# Patient Record
Sex: Female | Born: 1960 | Race: Asian | Hispanic: No | Marital: Married | Smoking: Never smoker
Health system: Southern US, Community
[De-identification: ages and names within clinical notes are randomized; demographics above are authoritative.]

## PROBLEM LIST (undated history)

## (undated) DIAGNOSIS — R609 Edema, unspecified: Secondary | ICD-10-CM

## (undated) DIAGNOSIS — R809 Proteinuria, unspecified: Secondary | ICD-10-CM

## (undated) DIAGNOSIS — C801 Malignant (primary) neoplasm, unspecified: Secondary | ICD-10-CM

## (undated) DIAGNOSIS — E079 Disorder of thyroid, unspecified: Secondary | ICD-10-CM

## (undated) HISTORY — PX: TUBAL LIGATION: SHX77

## (undated) HISTORY — DX: Edema, unspecified: R60.9

## (undated) HISTORY — DX: Disorder of thyroid, unspecified: E07.9

---

## 1999-02-23 ENCOUNTER — Other Ambulatory Visit: Admission: RE | Admit: 1999-02-23 | Discharge: 1999-02-23 | Payer: Self-pay | Admitting: Obstetrics and Gynecology

## 2000-03-27 ENCOUNTER — Other Ambulatory Visit: Admission: RE | Admit: 2000-03-27 | Discharge: 2000-03-27 | Payer: Self-pay | Admitting: Obstetrics and Gynecology

## 2001-05-07 ENCOUNTER — Other Ambulatory Visit: Admission: RE | Admit: 2001-05-07 | Discharge: 2001-05-07 | Payer: Self-pay | Admitting: Obstetrics and Gynecology

## 2002-06-14 ENCOUNTER — Other Ambulatory Visit: Admission: RE | Admit: 2002-06-14 | Discharge: 2002-06-14 | Payer: Self-pay | Admitting: Obstetrics and Gynecology

## 2003-07-15 ENCOUNTER — Other Ambulatory Visit: Admission: RE | Admit: 2003-07-15 | Discharge: 2003-07-15 | Payer: Self-pay | Admitting: Obstetrics and Gynecology

## 2004-08-03 ENCOUNTER — Other Ambulatory Visit: Admission: RE | Admit: 2004-08-03 | Discharge: 2004-08-03 | Payer: Self-pay | Admitting: Obstetrics and Gynecology

## 2005-08-28 ENCOUNTER — Other Ambulatory Visit: Admission: RE | Admit: 2005-08-28 | Discharge: 2005-08-28 | Payer: Self-pay | Admitting: Obstetrics and Gynecology

## 2005-09-03 ENCOUNTER — Ambulatory Visit (HOSPITAL_COMMUNITY): Admission: RE | Admit: 2005-09-03 | Discharge: 2005-09-03 | Payer: Self-pay | Admitting: Obstetrics and Gynecology

## 2008-02-11 ENCOUNTER — Encounter (INDEPENDENT_AMBULATORY_CARE_PROVIDER_SITE_OTHER): Payer: Self-pay | Admitting: *Deleted

## 2008-11-02 ENCOUNTER — Encounter (INDEPENDENT_AMBULATORY_CARE_PROVIDER_SITE_OTHER): Payer: Self-pay | Admitting: *Deleted

## 2008-11-28 ENCOUNTER — Ambulatory Visit: Payer: Self-pay | Admitting: Gastroenterology

## 2008-12-12 ENCOUNTER — Ambulatory Visit: Payer: Self-pay | Admitting: Gastroenterology

## 2012-05-13 ENCOUNTER — Other Ambulatory Visit: Payer: Self-pay | Admitting: Obstetrics and Gynecology

## 2014-02-22 ENCOUNTER — Encounter: Payer: Self-pay | Admitting: Gastroenterology

## 2014-05-11 ENCOUNTER — Other Ambulatory Visit: Payer: Self-pay | Admitting: Obstetrics and Gynecology

## 2014-05-12 LAB — CYTOLOGY - PAP

## 2014-07-08 ENCOUNTER — Other Ambulatory Visit (HOSPITAL_COMMUNITY): Payer: Self-pay | Admitting: Nephrology

## 2014-07-08 DIAGNOSIS — R609 Edema, unspecified: Secondary | ICD-10-CM

## 2014-07-08 DIAGNOSIS — R82998 Other abnormal findings in urine: Secondary | ICD-10-CM

## 2014-07-18 ENCOUNTER — Other Ambulatory Visit: Payer: Self-pay | Admitting: Radiology

## 2014-07-19 ENCOUNTER — Encounter (HOSPITAL_COMMUNITY): Payer: Self-pay | Admitting: Pharmacy Technician

## 2014-07-20 ENCOUNTER — Ambulatory Visit (HOSPITAL_COMMUNITY)
Admission: RE | Admit: 2014-07-20 | Discharge: 2014-07-20 | Disposition: A | Discharge status: Home / Self Care | Payer: 59 | Source: Ambulatory Visit | Attending: Nephrology | Admitting: Nephrology

## 2014-07-20 ENCOUNTER — Encounter (HOSPITAL_COMMUNITY): Payer: Self-pay

## 2014-07-20 DIAGNOSIS — R82998 Other abnormal findings in urine: Secondary | ICD-10-CM

## 2014-07-20 DIAGNOSIS — R8299 Other abnormal findings in urine: Secondary | ICD-10-CM | POA: Insufficient documentation

## 2014-07-20 DIAGNOSIS — R609 Edema, unspecified: Secondary | ICD-10-CM | POA: Insufficient documentation

## 2014-07-20 DIAGNOSIS — R809 Proteinuria, unspecified: Secondary | ICD-10-CM | POA: Insufficient documentation

## 2014-07-20 DIAGNOSIS — R791 Abnormal coagulation profile: Secondary | ICD-10-CM | POA: Diagnosis not present

## 2014-07-20 HISTORY — DX: Proteinuria, unspecified: R80.9

## 2014-07-20 LAB — CBC
HCT: 38.2 % (ref 36.0–46.0)
Hemoglobin: 12.7 g/dL (ref 12.0–15.0)
MCH: 28.2 pg (ref 26.0–34.0)
MCHC: 33.2 g/dL (ref 30.0–36.0)
MCV: 84.7 fL (ref 78.0–100.0)
Platelets: 466 10*3/uL — ABNORMAL HIGH (ref 150–400)
RBC: 4.51 MIL/uL (ref 3.87–5.11)
RDW: 15.9 % — ABNORMAL HIGH (ref 11.5–15.5)
WBC: 7.4 10*3/uL (ref 4.0–10.5)

## 2014-07-20 LAB — PROTIME-INR
INR: 1.51 — ABNORMAL HIGH (ref 0.00–1.49)
Prothrombin Time: 18.4 seconds — ABNORMAL HIGH (ref 11.6–15.2)

## 2014-07-20 LAB — APTT: aPTT: 48 seconds — ABNORMAL HIGH (ref 24–37)

## 2014-07-20 MED ORDER — SODIUM CHLORIDE 0.9 % IV SOLN: Freq: Once | INTRAVENOUS | Status: DC

## 2014-07-20 NOTE — H&P (Signed)
Chief Complaint: Proteinuria  Referring Physician(s): Powell,Alvin C  History of Present Illness: Anna Calderon is a 54 y.o. female   Pt has noted edema and foamy urine for weeks Referred to Dr Florene Glen Proteinuria and Neg ANA Now scheduled for random renal biopsy  Past Medical History  Diagnosis Date  . Proteinuria     History reviewed. No pertinent past surgical history.  Allergies: Review of patient's allergies indicates no known allergies.  Medications: Prior to Admission medications   Medication Sig Start Date End Date Taking? Authorizing Provider  Vitamin D, Ergocalciferol, (DRISDOL) 50000 UNITS CAPS capsule Take 50,000 Units by mouth every 7 (seven) days.   Yes Historical Provider, MD    History reviewed. No pertinent family history.  History   Social History  . Marital Status: Unknown    Spouse Name: N/A    Number of Children: N/A  . Years of Education: N/A   Social History Main Topics  . Smoking status: Never Smoker   . Smokeless tobacco: None  . Alcohol Use: No  . Drug Use: No  . Sexual Activity: None   Other Topics Concern  . None   Social History Narrative  . None    Review of Systems: A 12 point ROS discussed and pertinent positives are indicated in the HPI above.  All other systems are negative.  Review of Systems  Constitutional: Negative for fever, activity change and appetite change.  Respiratory: Negative for cough, chest tightness and shortness of breath.   Cardiovascular: Negative for chest pain.  Gastrointestinal: Negative for abdominal pain.  Genitourinary: Negative for difficulty urinating.       Foamy urine  Musculoskeletal: Negative for back pain.       Edema in all extremities  Psychiatric/Behavioral: Negative for behavioral problems and confusion.    Vital Signs: BP 88/54 mmHg  Pulse 83  Temp(Src) 97.7 F (36.5 C) (Oral)  Resp 18  Ht 5\' 1"  (1.549 m)  Wt 58.968 kg (130 lb)  BMI 24.58 kg/m2  SpO2 97%  Physical  Exam  Constitutional: She is oriented to person, place, and time. She appears well-developed and well-nourished.  Cardiovascular: Normal rate and regular rhythm.   No murmur heard. Pulmonary/Chest: Effort normal and breath sounds normal. She has no wheezes.  Abdominal: Soft. Bowel sounds are normal. She exhibits no distension.  Musculoskeletal: Normal range of motion. She exhibits no edema.  Neurological: She is alert and oriented to person, place, and time.  Skin: Skin is warm and dry.  Psychiatric: She has a normal mood and affect. Her behavior is normal. Judgment and thought content normal.  Nursing note and vitals reviewed.   Imaging: No results found.  Labs:  CBC:  Recent Labs  07/20/14 0745  WBC 7.4  HGB 12.7  HCT 38.2  PLT 466*    COAGS:  Recent Labs  07/20/14 0745  INR 1.51*  APTT 48*    BMP: No results for input(s): NA, K, CL, CO2, GLUCOSE, BUN, CALCIUM, CREATININE, GFRNONAA, GFRAA in the last 8760 hours.  Invalid input(s): CMP  LIVER FUNCTION TESTS: No results for input(s): BILITOT, AST, ALT, ALKPHOS, PROT, ALBUMIN in the last 8760 hours.  TUMOR MARKERS: No results for input(s): AFPTM, CEA, CA199, CHROMGRNA in the last 8760 hours.  Assessment and Plan:  Proteinuria Neg ANA Now scheduled for random renal bx Pt and dtr aware of procedure benefits and risks including but not limited to: Infection; bleeding; organ damage Agreeable to proceed Consent signed andin chart  Thank you for this interesting consult.  I greatly enjoyed meeting Anna Calderon and look forward to participating in their care.  Signed: Surah Pelley A 07/20/2014, 9:40 AM   I spent a total of 20 minutes face to face in clinical consultation, greater than 50% of which was counseling/coordinating care for random renal bx

## 2014-08-08 ENCOUNTER — Other Ambulatory Visit: Payer: Self-pay | Admitting: Radiology

## 2014-08-09 ENCOUNTER — Encounter (HOSPITAL_COMMUNITY): Payer: Self-pay

## 2014-08-09 ENCOUNTER — Ambulatory Visit (HOSPITAL_COMMUNITY)
Admission: RE | Admit: 2014-08-09 | Discharge: 2014-08-09 | Disposition: A | Discharge status: Home / Self Care | Payer: 59 | Source: Ambulatory Visit | Attending: Nephrology | Admitting: Nephrology

## 2014-08-09 VITALS — BP 88/59 | HR 83 | Temp 97.5°F | Resp 21 | Ht 61.0 in | Wt 130.0 lb

## 2014-08-09 DIAGNOSIS — N289 Disorder of kidney and ureter, unspecified: Secondary | ICD-10-CM | POA: Insufficient documentation

## 2014-08-09 DIAGNOSIS — R609 Edema, unspecified: Secondary | ICD-10-CM

## 2014-08-09 DIAGNOSIS — R82998 Other abnormal findings in urine: Secondary | ICD-10-CM

## 2014-08-09 LAB — CBC
HCT: 36.6 % (ref 36.0–46.0)
HEMOGLOBIN: 12.6 g/dL (ref 12.0–15.0)
MCH: 28.6 pg (ref 26.0–34.0)
MCHC: 34.4 g/dL (ref 30.0–36.0)
MCV: 83.2 fL (ref 78.0–100.0)
Platelets: 312 10*3/uL (ref 150–400)
RBC: 4.4 MIL/uL (ref 3.87–5.11)
RDW: 16.9 % — ABNORMAL HIGH (ref 11.5–15.5)
WBC: 7 10*3/uL (ref 4.0–10.5)

## 2014-08-09 LAB — PROTIME-INR
INR: 1.34 (ref 0.00–1.49)
Prothrombin Time: 16.7 seconds — ABNORMAL HIGH (ref 11.6–15.2)

## 2014-08-09 LAB — APTT: APTT: 43 s — AB (ref 24–37)

## 2014-08-09 MED ORDER — FENTANYL CITRATE 0.05 MG/ML IJ SOLN
INTRAMUSCULAR | Status: AC
  Filled 2014-08-09: qty 2

## 2014-08-09 MED ORDER — MIDAZOLAM HCL 2 MG/2ML IJ SOLN
INTRAMUSCULAR | Status: AC
  Filled 2014-08-09: qty 2

## 2014-08-09 MED ORDER — SODIUM CHLORIDE 0.9 % IV SOLN
INTRAVENOUS | Status: DC
  Administered 2014-08-09: 13:00:00 via INTRAVENOUS

## 2014-08-09 MED ORDER — MIDAZOLAM HCL 2 MG/2ML IJ SOLN
INTRAMUSCULAR | Status: AC | PRN
  Administered 2014-08-09: 1 mg via INTRAVENOUS

## 2014-08-09 MED ORDER — FENTANYL CITRATE 0.05 MG/ML IJ SOLN
INTRAMUSCULAR | Status: AC | PRN
  Administered 2014-08-09: 50 ug via INTRAVENOUS

## 2014-08-09 MED ORDER — LIDOCAINE HCL (PF) 1 % IJ SOLN
INTRAMUSCULAR | Status: AC
  Filled 2014-08-09: qty 10

## 2014-08-09 NOTE — Procedures (Signed)
Technically successful US guided biopsy of inferior pole of the right kidney.  No immediate complications.  

## 2014-08-09 NOTE — Sedation Documentation (Signed)
Pathology in room to approve specimen, done.  Procedure complete per Dr. Pascal Lux, pt awakens easily. Pt denied pain per interpretor.

## 2014-08-09 NOTE — Sedation Documentation (Addendum)
Interpretor, Marijean Bravo, at side to assist patient with [place] to [place].

## 2014-08-09 NOTE — Sedation Documentation (Signed)
Labs back and shown to Dr. Pascal Lux.

## 2014-08-09 NOTE — Sedation Documentation (Signed)
Patient is resting comfortably. 

## 2014-08-09 NOTE — H&P (Signed)
Chief Complaint: Proteinuria; edema  Referring Physician(s): Powell,Alvin C  History of Present Illness: Anna Calderon is a 54 y.o. female   Proteinuria Noted foamy urine x 6-12 months Lower extremity edema Scheduled for random renal biopsy Pt was scheduled for same procedure 07/20/14 and was cancelled secondary elevated INR (1.51) and elevated PTT  (48) Rescheduled now per Dr Florene Glen  Past Medical History  Diagnosis Date  . Proteinuria     History reviewed. No pertinent past surgical history.  Allergies: Review of patient's allergies indicates no known allergies.  Medications: Prior to Admission medications   Medication Sig Start Date End Date Taking? Authorizing Provider  Vitamin D, Ergocalciferol, (DRISDOL) 50000 UNITS CAPS capsule Take 50,000 Units by mouth every 7 (seven) days.   Yes Historical Provider, MD     History reviewed. No pertinent family history.  History   Social History  . Marital Status: Married    Spouse Name: N/A  . Number of Children: N/A  . Years of Education: N/A   Social History Main Topics  . Smoking status: Never Smoker   . Smokeless tobacco: Not on file  . Alcohol Use: No  . Drug Use: No  . Sexual Activity: Not on file   Other Topics Concern  . None   Social History Narrative     Review of Systems: A 12 point ROS discussed and pertinent positives are indicated in the HPI above.  All other systems are negative.  Review of Systems  Constitutional: Negative for fever, activity change and fatigue.  Respiratory: Negative for shortness of breath.   Gastrointestinal: Negative for abdominal pain.  Neurological: Negative for dizziness.  Psychiatric/Behavioral: Negative for behavioral problems and confusion.     Vital Signs: BP 117/70 mmHg  Pulse 80  Temp(Src) 97 F (36.1 C) (Oral)  Resp 20  Ht 5\' 1"  (1.549 m)  Wt 58.968 kg (130 lb)  BMI 24.58 kg/m2  SpO2 100%  Physical Exam  Constitutional: She is oriented to person,  place, and time. She appears well-developed and well-nourished.  Cardiovascular: Normal rate, regular rhythm and normal heart sounds.   No murmur heard. Pulmonary/Chest: Effort normal and breath sounds normal. She has no wheezes.  Abdominal: Soft. Bowel sounds are normal. There is no tenderness.  Musculoskeletal: Normal range of motion. She exhibits edema.  Minimal edema noted B low extr  Neurological: She is alert and oriented to person, place, and time.  Skin: Skin is warm and dry.  Psychiatric: She has a normal mood and affect. Her behavior is normal. Judgment and thought content normal.  Used [place] interpreter for consent  Nursing note and vitals reviewed.   Mallampati Score:  MD Evaluation Airway: WNL Heart: WNL Abdomen: WNL Chest/ Lungs: WNL ASA  Classification: 2 Mallampati/Airway Score: One  Imaging: No results found.  Labs:  CBC:  Recent Labs  07/20/14 0745  WBC 7.4  HGB 12.7  HCT 38.2  PLT 466*    COAGS:  Recent Labs  07/20/14 0745  INR 1.51*  APTT 48*    BMP: No results for input(s): NA, K, CL, CO2, GLUCOSE, BUN, CALCIUM, CREATININE, GFRNONAA, GFRAA in the last 8760 hours.  Invalid input(s): CMP  LIVER FUNCTION TESTS: No results for input(s): BILITOT, AST, ALT, ALKPHOS, PROT, ALBUMIN in the last 8760 hours.  TUMOR MARKERS: No results for input(s): AFPTM, CEA, CA199, CHROMGRNA in the last 8760 hours.  Assessment and Plan:  Proteinuria; low extremity edema x several months Now scheduled for random renal bx  Pt and dtr aware of procedure benefits and risks including but not limited to: Infection; bleeding; hematuria; organ damage; damage to surrounding structures Consent signed andin chart   Thank you for this interesting consult.  I greatly enjoyed meeting Anna Calderon and look forward to participating in their care.  Signed: Kayliana Codd A 08/09/2014, 1:29 PM   I spent a total of  20 Minutes in face to face in clinical  consultation, greater than 50% of which was counseling/coordinating care for random renal biopsy

## 2014-08-09 NOTE — Discharge Instructions (Signed)
Kidney Biopsy A biopsy is a test that involves collecting small pieces of tissue, usually with a needle. The tissue is then examined under a microscope. A kidney biopsy can help a health care provider make a diagnosis and determine the best course of treatment. Your health care provider may recommend a kidney biopsy if you have any of the following conditions:  Blood in your urine (hematuria).  Excessive protein in your urine (proteinuria).  Impaired kidney function that causes excessive waste products in your blood. A specialist will look at the kidney tissue samples to check for unusual deposits, scarring, or infeKidney Biopsy, Care After Refer to this sheet in the next few weeks. These instructions provide you with information on caring for yourself after your procedure. Your health care provider may also give you more specific instructions. Your treatment has been planned according to current medical practices, but problems sometimes occur. Call your health care provider if you have any problems or questions after your procedure.  WHAT TO EXPECT AFTER THE PROCEDURE   You may notice blood in the urine for the first 24 hours after the biopsy.  You may feel some pain at the biopsy site for 1-2 weeks after the biopsy. HOME CARE INSTRUCTIONS  Do not lift anything heavier than 10 lb (4.5 kg) for 2 weeks.  Do not take any non-steroidal anti-inflammatory drugs (NSAIDs) or any blood thinners for a week after the biopsy unless instructed to do so by your health care provider.  Only take medicines for pain, fever, or discomfort as directed by your health care provider. SEEK MEDICAL CARE IF:  You have bloody urine more than 24 hours after the biopsy.   You develop a fever.   You cannot urinate.   You have increasing pain at the biopsy site.  SEEK IMMEDIATE MEDICAL CARE IF: You feel faint or dizzy.  Document Released: 02/10/2013 Document Reviewed: 02/10/2013 [facility] Patient Information  2015 [place]. This information is not intended to replace advice given to you by your health care provider. Make sure you discuss any questions you have with your health care provider. cting organisms that would explain your condition. If you have a kidney transplant, a biopsy can also help explain why a transplanted kidney is not working properly. Talk with your health care provider about what information might be learned from the biopsy and the risks involved. This can help you make a decision about whether a biopsy is worthwhile in your case. LET [facility] CARE PROVIDER KNOW ABOUT:  Any allergies you have.  All medicines you are taking, including vitamins, herbs, eye drops, creams, and over-the-counter medicines.  Previous problems you or members of your family have had with the use of anesthetics.  Any blood disorders you have.  Previous surgeries you have had.  Medical conditions you have. RISKS AND COMPLICATIONS Generally, a kidney biopsy is a safe procedure. However, as with any procedure, complications can occur. Possible complications include:  Infection.  Bleeding. BEFORE THE PROCEDURE  Make sure you understand the need for a biopsy.  Do not eat or drink for 8 hours before the test or as directed by your health care provider.  You will need to give blood and urine samples before the biopsy. This is to make sure you do not have a condition where you should not have a biopsy. PROCEDURE Kidney biopsies are usually done in a hospital. During the procedure, you may be fully awake with light sedation, or you may be asleep under  general anesthesia. The entire procedure usually takes an hour.  You will lie on your stomach to position the kidneys near the surface of your back. If you have a transplanted kidney, you will lie on your back.  The health care provider will inject a local painkiller. For a through-the-skin (percutaneous) biopsy, the health care provider will use  a locating needle and X-ray or ultrasound equipment to find the right spot.  A collecting needle will be used to gather the tissue. If you are awake, you will be asked to hold your breath as the needle is inserted and collects the tissue. Each insertion and collection lasts about 30 seconds or a little longer. You will be told when to exhale. AFTER THE PROCEDURE  You will lie on your back for 12 to 24 hours. If you have a transplanted kidney, you may not have to lie on your back. During this time, your back will probably feel sore. You may stay in the hospital overnight after the procedure so that staff can check your condition.  You may notice some blood in your urine for 24 hours after the test. To detect any problems, your health care providers will:  Monitor your blood pressure and pulse.  Take blood samples to measure the amount of red blood cells.  Examine the urine that you pass.  On rare occasions when bleeding is excessive, it may be necessary to replace lost blood with a transfusion.  It is your responsibility to obtain your test results. Ask the lab or department performing the test when and how you will get your results. FOR MORE INFORMATION  American [employer] Fund: https://mathis.com/  National Kidney Foundation: www.kidney.org  National Kidney and Urologic Diseases Information Clearinghouse: http://kidney.AmenCredit.is Document Released: 04/20/2004 Document Revised: 03/31/2013 Document Reviewed: 12/14/2012 [facility] Patient Information 2015 [place], [place]. This information is not intended to replace advice given to you by your health care provider. Make sure you discuss any questions you have with your health care provider.                        Return To Work _______Diem Ngyen__________ was treated at our facility. INJURY OR ILLNESS WAS: _____ Work-related __ x___ Not work-related _____ Undetermined if work-related RETURN TO WORK  Employee may return  to work with lifting restriction on: 08/10/14  Employee may return to unrestricted work on: 08/11/14  [place] Work activities not tolerated include: _____ Bending _____ Prolonged sitting __x ___ Lifting more than 20 lbs for 1 day at work until 08/11/14 _____ Squatting _____ Prolonged standing _____ Lesle Reek _____ Reaching _____ Pushing and pulling _____ Walking _____ Other ____________________ Show this Return to Work statement to your supervisor at work as soon as possible. Your employer should be aware of your condition and can help with the necessary work activity restrictions. If you wish to return to work sooner than the date above, or if you have further problems which make it difficult for you to return at that time, please call us or your caregiver.  Ulice Dash Watts  MD__________________________________ Physician Signature  ____Jay Pascal Lux  MD _ Per telephone_by Larey Dresser RN__ Date 08/09/14 Document Released: 06/10/2005 Document Revised: 09/02/2011 Document Reviewed: 11/25/2006 [facility] Patient Information 2015 [place], [place]. This information is not intended to replace advice given to you by your health care provider. Make sure you discuss any questions you have with your health care provider.

## 2014-08-09 NOTE — Sedation Documentation (Signed)
Dr. Pascal Lux aware no results available for PT/PTT/INR. Called lab, will proceed at this time w/ timeout and keep checking. Pt is not on any blood thinners.

## 2014-08-19 ENCOUNTER — Encounter (HOSPITAL_COMMUNITY): Payer: Self-pay

## 2014-08-22 ENCOUNTER — Telehealth: Payer: Self-pay | Admitting: Oncology

## 2014-08-22 NOTE — Telephone Encounter (Signed)
Pt's daughter confirmed and request an earlier appt. was resched to 09/07/14 at 10:30am

## 2014-09-06 ENCOUNTER — Telehealth: Payer: Self-pay | Admitting: Oncology

## 2014-09-06 ENCOUNTER — Telehealth: Payer: Self-pay | Admitting: *Deleted

## 2014-09-06 NOTE — Telephone Encounter (Signed)
called office a second time to obtain additional records.  left message for [place].

## 2014-09-06 NOTE — Telephone Encounter (Signed)
This RN spoke with patient and reminded her of her appointments tomorrow at [facility]. Patient verbalized understanding.

## 2014-09-07 ENCOUNTER — Ambulatory Visit (HOSPITAL_BASED_OUTPATIENT_CLINIC_OR_DEPARTMENT_OTHER): Payer: 59

## 2014-09-07 ENCOUNTER — Other Ambulatory Visit: Payer: Self-pay

## 2014-09-07 ENCOUNTER — Ambulatory Visit (HOSPITAL_BASED_OUTPATIENT_CLINIC_OR_DEPARTMENT_OTHER): Payer: 59 | Admitting: Oncology

## 2014-09-07 ENCOUNTER — Encounter: Payer: Self-pay | Admitting: Oncology

## 2014-09-07 ENCOUNTER — Telehealth: Payer: Self-pay | Admitting: Oncology

## 2014-09-07 ENCOUNTER — Ambulatory Visit: Payer: 59

## 2014-09-07 VITALS — BP 95/61 | HR 87 | Temp 98.0°F | Resp 18 | Ht 61.0 in | Wt 126.8 lb

## 2014-09-07 DIAGNOSIS — R809 Proteinuria, unspecified: Secondary | ICD-10-CM

## 2014-09-07 DIAGNOSIS — R600 Localized edema: Secondary | ICD-10-CM

## 2014-09-07 DIAGNOSIS — E859 Amyloidosis, unspecified: Secondary | ICD-10-CM

## 2014-09-07 LAB — CBC WITH DIFFERENTIAL/PLATELET
BASO%: 0.4 % (ref 0.0–2.0)
Basophils Absolute: 0 10*3/uL (ref 0.0–0.1)
EOS ABS: 0.3 10*3/uL (ref 0.0–0.5)
EOS%: 3.3 % (ref 0.0–7.0)
HCT: 41.3 % (ref 34.8–46.6)
HGB: 14.1 g/dL (ref 11.6–15.9)
LYMPH%: 30.8 % (ref 14.0–49.7)
MCH: 28.8 pg (ref 25.1–34.0)
MCHC: 34.1 g/dL (ref 31.5–36.0)
MCV: 84.5 fL (ref 79.5–101.0)
MONO#: 0.6 10*3/uL (ref 0.1–0.9)
MONO%: 7.7 % (ref 0.0–14.0)
NEUT%: 57.8 % (ref 38.4–76.8)
NEUTROS ABS: 4.8 10*3/uL (ref 1.5–6.5)
NRBC: 0 % (ref 0–0)
Platelets: 327 10*3/uL (ref 145–400)
RBC: 4.89 10*6/uL (ref 3.70–5.45)
RDW: 16.8 % — AB (ref 11.2–14.5)
WBC: 8.2 10*3/uL (ref 3.9–10.3)
lymph#: 2.5 10*3/uL (ref 0.9–3.3)

## 2014-09-07 LAB — COMPREHENSIVE METABOLIC PANEL (CC13)
ALBUMIN: 1 g/dL — AB (ref 3.5–5.0)
ALK PHOS: 65 U/L (ref 40–150)
ALT: 19 U/L (ref 0–55)
AST: 25 U/L (ref 5–34)
Anion Gap: 7 mEq/L (ref 3–11)
BUN: 7.4 mg/dL (ref 7.0–26.0)
CALCIUM: 7.6 mg/dL — AB (ref 8.4–10.4)
CO2: 27 mEq/L (ref 22–29)
Chloride: 105 mEq/L (ref 98–109)
Creatinine: 0.6 mg/dL (ref 0.6–1.1)
Glucose: 84 mg/dl (ref 70–140)
POTASSIUM: 3.8 meq/L (ref 3.5–5.1)
Total Protein: 4 g/dL — ABNORMAL LOW (ref 6.4–8.3)

## 2014-09-07 NOTE — Progress Notes (Signed)
Please see consult note.  

## 2014-09-07 NOTE — Telephone Encounter (Signed)
Gave avs & calendar for April. Sent patient back to labs

## 2014-09-07 NOTE — Consult Note (Signed)
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   HPI: 54 year old [place] woman have lived in this area for the last 30 years. She has been in reasonably good health still about 12 months ago started developing increased lower extremity edema. Her edema subsequently increased and developed periorbital edema as well as abdominal wall edema. She was evaluated initially by her primary care physician and her gynecologist. She had a 24 hour urine collection and she had 8825 mg of protein in the urine with a clearance of 127 mL/m of creatinine. Based on these findings she was evaluated by Dr. Florene Glen at [facility] and laboratory testing revealed that her creatinine was 0.39 her total protein was low at 3.6 with an albumin of 1.7. She had normal liver function tests. White cell count was slightly elevated at 4.6 hemoglobin of 13.5 and platelet count of 438. Her ANA was negative and her serum protein electrophoresis did not show an M spike. Her hepatitis panel was negative. She subsequently underwent a kidney biopsy on 08/10/2014 and these findings showed positive staining for [place] red suggesting amyloidosis. However the pathology report indicates that do to lack of immunofluorescence microscopy they could not determine the subtype. Patient referred to me for further evaluation.  Clinically, she was started on diuretics in the form of hydrochlorothiazide which have helped her symptoms slightly. She does report some mild fatigue but no other symptoms. She does not report any chest pain or shortness of breath. Did not report any dyspnea on exertion. She did not report any easy bruisability or petechiae. She continues to report lower extremity edema and abdominal wall edema. She still able to work and function properly. She did not report any headaches, blurry vision, double vision, syncope or seizures. She does not report any fevers, chills, sweats, weight loss or abdominal discomfort. Did not report any  nausea, vomiting she did report very brief episode of diarrhea which is resolved. She does not report any frequency urgency or hesitancy. She did not report any hematuria. She did not report any skeletal pain, lymphadenopathy or easy bruisability. The remaining review of systems unremarkable.   Past Medical History  Diagnosis Date  . Proteinuria   . Edema   :   no previous surgical history.    Current outpatient prescriptions:  .  cetirizine (ZYRTEC) 10 MG tablet, Take 10 mg by mouth as needed., Disp: , Rfl:  .  dextromethorphan 15 MG/5ML syrup, Take 10 mLs by mouth as needed., Disp: , Rfl:  .  hydrochlorothiazide (HYDRODIURIL) 25 MG tablet, Take 25 mg by mouth daily., Disp: , Rfl:  .  Vitamin D, Ergocalciferol, (DRISDOL) 50000 UNITS CAPS capsule, Take 50,000 Units by mouth every 7 (seven) days., Disp: , Rfl: :  No Known Allergies:   family history is been unremarkable..:  History   Social History  . Marital Status: Married    Spouse Name: N/A  . Number of Children: N/A  . Years of Education: N/A   Occupational History  . Not on file.   Social History Main Topics  . Smoking status: Not on file  . Smokeless tobacco: Not on file  . Alcohol Use: Not on file  . Drug Use: Not on file  . Sexual Activity: Not on file   Other Topics Concern  . Not on file   Social History Narrative  . No narrative on file  :  Pertinent items are noted in HPI.  Exam: ECOG 1 Blood pressure 95/61, pulse 87, temperature 98 F (36.7  C), temperature source Oral, resp. rate 18, height '5\' 1"'  (1.549 m), weight 126 lb 12.8 oz (57.516 kg), SpO2 100 %. General appearance: alert and cooperative Head: Normocephalic, without obvious abnormality Throat: lips, mucosa, and tongue normal; teeth and gums normal Neck: no adenopathy Back: negative Resp: clear to auscultation bilaterally Chest wall: no tenderness Cardio: regular rate and rhythm, S1, S2 normal, no murmur, click, rub or gallop GI: soft,  non-tender; bowel sounds normal; no masses,  no organomegaly Extremities: edema 1+ bilaterally at the ankles. Pulses: 2+ and symmetric Skin: Skin color, texture, turgor normal. No rashes or lesions Lymph nodes: Cervical, supraclavicular, and axillary nodes normal.    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 The exact subtype of her amyloidosis is unclear but certainly a abdominal amyloidosis as a possibility. The natural course of this disease was discussed extensively with the patient and her daughter that accompany her today. The first step is to confirm the subtype of amyloidosis and she will certainly need a bone marrow biopsy. That will help identify whether she has bone marrow involvement as well as potentially the subtype of amyloid. She would benefit as well from a staging CT scan to determine whether she has any lymphadenopathy or potentially GI involvement. She has no symptoms to suggest active GI involvement at this time. She will also require cardiac evaluation whether that would be cardiac MRI or other cardiac testing depending on the results of our initial workup. I'll also obtain serum light chains in addition to repeat his serum protein electrophoresis for completeness.  After obtaining these initial testing and staging workup and the diagnosis is confirmed, we will discuss treatment options. If we are dealing with AL. amyloidosis she would be an excellent candidate for induction chemotherapy and potentially autologous stem cell transplant especially if she has no cardiac involvement. I anticipate she will require a multi-agent chemotherapy likely utilizing Velcade, cyclophosphamide and dexamethasone induction. I will also send her for evaluation at [facility] for an opinion and potentially evaluate her stem cell transplant  options.  She will follow-up in the near future after completing these initial testing and we'll proceed from there.   2. Lower extremity edema: This is related to have proteinuria and seems to be manageable with diuretics.  3. Follow-up: Will be in 2-3 weeks after completing her initial workup.

## 2014-09-07 NOTE — Progress Notes (Signed)
Checked in new pt with no financial concerns at this time.  Pt has my card for any billing questions or concerns. °

## 2014-09-09 ENCOUNTER — Encounter: Payer: Self-pay | Admitting: Oncology

## 2014-09-12 LAB — SPEP & IFE WITH QIG
ALPHA-1-GLOBULIN: 4.8 % (ref 2.9–4.9)
Albumin ELP: 50.1 % — ABNORMAL LOW (ref 55.8–66.1)
Alpha-2-Globulin: 27.4 % — ABNORMAL HIGH (ref 7.1–11.8)
Beta 2: 5.7 % (ref 3.2–6.5)
Beta Globulin: 4.2 % — ABNORMAL LOW (ref 4.7–7.2)
Gamma Globulin: 7.8 % — ABNORMAL LOW (ref 11.1–18.8)
IGM, SERUM: 216 mg/dL (ref 52–322)
IgA: 204 mg/dL (ref 69–380)
IgG (Immunoglobin G), Serum: 260 mg/dL — ABNORMAL LOW (ref 690–1700)
M-Spike, %: 0.06 g/dL
Total Protein, Serum Electrophoresis: 3.4 g/dL — ABNORMAL LOW (ref 6.0–8.3)

## 2014-09-12 LAB — KAPPA/LAMBDA LIGHT CHAINS
Kappa free light chain: 1.52 mg/dL (ref 0.33–1.94)
Kappa:Lambda Ratio: 0.05 — ABNORMAL LOW (ref 0.26–1.65)
Lambda Free Lght Chn: 29.8 mg/dL — ABNORMAL HIGH (ref 0.57–2.63)

## 2014-09-13 ENCOUNTER — Ambulatory Visit (HOSPITAL_COMMUNITY)
Admission: RE | Admit: 2014-09-13 | Discharge: 2014-09-13 | Disposition: A | Discharge status: Home / Self Care | Payer: 59 | Source: Ambulatory Visit | Attending: Oncology | Admitting: Oncology

## 2014-09-13 DIAGNOSIS — R109 Unspecified abdominal pain: Secondary | ICD-10-CM | POA: Insufficient documentation

## 2014-09-13 DIAGNOSIS — R634 Abnormal weight loss: Secondary | ICD-10-CM | POA: Insufficient documentation

## 2014-09-13 DIAGNOSIS — E859 Amyloidosis, unspecified: Secondary | ICD-10-CM | POA: Insufficient documentation

## 2014-09-13 MED ORDER — IOHEXOL 300 MG/ML  SOLN
100.0000 mL | Freq: Once | INTRAMUSCULAR | Status: AC | PRN
  Administered 2014-09-13: 100 mL via INTRAVENOUS

## 2014-09-16 ENCOUNTER — Ambulatory Visit: Payer: Self-pay

## 2014-09-16 ENCOUNTER — Other Ambulatory Visit: Payer: Self-pay | Admitting: Radiology

## 2014-09-16 ENCOUNTER — Ambulatory Visit: Payer: Self-pay | Admitting: Oncology

## 2014-09-20 ENCOUNTER — Ambulatory Visit (HOSPITAL_COMMUNITY)
Admission: RE | Admit: 2014-09-20 | Discharge: 2014-09-20 | Disposition: A | Discharge status: Home / Self Care | Payer: 59 | Source: Ambulatory Visit | Attending: Interventional Radiology | Admitting: Interventional Radiology

## 2014-09-20 ENCOUNTER — Encounter (HOSPITAL_COMMUNITY): Payer: Self-pay

## 2014-09-20 ENCOUNTER — Ambulatory Visit (HOSPITAL_COMMUNITY)
Admission: RE | Admit: 2014-09-20 | Discharge: 2014-09-20 | Disposition: A | Discharge status: Home / Self Care | Payer: 59 | Source: Ambulatory Visit | Attending: Oncology | Admitting: Oncology

## 2014-09-20 DIAGNOSIS — Z79899 Other long term (current) drug therapy: Secondary | ICD-10-CM | POA: Diagnosis not present

## 2014-09-20 DIAGNOSIS — E859 Amyloidosis, unspecified: Secondary | ICD-10-CM | POA: Insufficient documentation

## 2014-09-20 LAB — CBC WITH DIFFERENTIAL/PLATELET
Basophils Absolute: 0 10*3/uL (ref 0.0–0.1)
Basophils Relative: 0 % (ref 0–1)
EOS ABS: 0.3 10*3/uL (ref 0.0–0.7)
EOS PCT: 3 % (ref 0–5)
HEMATOCRIT: 39.3 % (ref 36.0–46.0)
HEMOGLOBIN: 13.1 g/dL (ref 12.0–15.0)
LYMPHS ABS: 2.5 10*3/uL (ref 0.7–4.0)
Lymphocytes Relative: 29 % (ref 12–46)
MCH: 28.2 pg (ref 26.0–34.0)
MCHC: 33.3 g/dL (ref 30.0–36.0)
MCV: 84.7 fL (ref 78.0–100.0)
MONO ABS: 0.5 10*3/uL (ref 0.1–1.0)
Monocytes Relative: 6 % (ref 3–12)
NEUTROS PCT: 62 % (ref 43–77)
Neutro Abs: 5.3 10*3/uL (ref 1.7–7.7)
PLATELETS: 356 10*3/uL (ref 150–400)
RBC: 4.64 MIL/uL (ref 3.87–5.11)
RDW: 17.1 % — ABNORMAL HIGH (ref 11.5–15.5)
WBC: 8.6 10*3/uL (ref 4.0–10.5)

## 2014-09-20 LAB — BASIC METABOLIC PANEL
ANION GAP: 6 (ref 5–15)
BUN: 9 mg/dL (ref 6–23)
CALCIUM: 7.7 mg/dL — AB (ref 8.4–10.5)
CHLORIDE: 105 mmol/L (ref 96–112)
CO2: 26 mmol/L (ref 19–32)
CREATININE: 0.56 mg/dL (ref 0.50–1.10)
GFR calc Af Amer: >90 mL/min (ref >90)
GLUCOSE: 83 mg/dL (ref 70–99)
Potassium: 3.9 mmol/L (ref 3.5–5.1)
SODIUM: 137 mmol/L (ref 135–145)

## 2014-09-20 LAB — APTT: aPTT: 53 seconds — ABNORMAL HIGH (ref 24–37)

## 2014-09-20 LAB — PROTIME-INR
INR: 1.42 (ref 0.00–1.49)
Prothrombin Time: 17.5 seconds — ABNORMAL HIGH (ref 11.6–15.2)

## 2014-09-20 LAB — BONE MARROW EXAM

## 2014-09-20 MED ORDER — MIDAZOLAM HCL 2 MG/2ML IJ SOLN
INTRAMUSCULAR | Status: AC
  Filled 2014-09-20: qty 6

## 2014-09-20 MED ORDER — HYDROCODONE-ACETAMINOPHEN 5-325 MG PO TABS
1.0000 | ORAL_TABLET | ORAL | Status: DC | PRN
  Filled 2014-09-20: qty 2

## 2014-09-20 MED ORDER — FENTANYL CITRATE 0.05 MG/ML IJ SOLN
INTRAMUSCULAR | Status: AC | PRN
  Administered 2014-09-20: 50 ug via INTRAVENOUS

## 2014-09-20 MED ORDER — MIDAZOLAM HCL 2 MG/2ML IJ SOLN
INTRAMUSCULAR | Status: AC | PRN
  Administered 2014-09-20: 1 mg via INTRAVENOUS

## 2014-09-20 MED ORDER — SODIUM CHLORIDE 0.9 % IV SOLN
INTRAVENOUS | Status: DC
  Administered 2014-09-20 (×2): via INTRAVENOUS

## 2014-09-20 MED ORDER — FENTANYL CITRATE 0.05 MG/ML IJ SOLN
INTRAMUSCULAR | Status: AC
  Filled 2014-09-20: qty 4

## 2014-09-20 NOTE — Progress Notes (Signed)
Patient has multiple marks on back from suction cup treatment

## 2014-09-20 NOTE — Procedures (Signed)
Interventional Radiology Procedure Note  Procedure: CT guided aspirate and core biopsy of right iliac bone Complications: None Recommendations: - Bedrest supine x 2 hrs - Hydrocodone PRN  Pain - Follow biopsy results  Signed,  Rosevelt Luu K. Sharday Michl, MD   

## 2014-09-20 NOTE — H&P (Signed)
Chief Complaint: "I'm here for a bone marrow biopsy" Referring Physician:Shadad HPI: Anna Calderon is an 54 y.o. 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 She is scheduled for a bone marrow biopsy. Has been NPO this am  Past Medical History:  Past Medical History  Diagnosis Date  . Proteinuria   . Edema     Past Surgical History: History reviewed. No pertinent past surgical history.  Family History: History reviewed. No pertinent family history.  Social History:  reports that she has never smoked. She does not have any smokeless tobacco history on file. She reports that she does not drink alcohol or use illicit drugs.  Allergies: No Known Allergies  Medications:   Medication List    ASK your doctor about these medications        hydrochlorothiazide 25 MG tablet  Commonly known as:  HYDRODIURIL  Take 25 mg by mouth daily.     Vitamin D (Ergocalciferol) 50000 UNITS Caps capsule  Commonly known as:  DRISDOL  Take 50,000 Units by mouth every 7 (seven) days.        Please HPI for pertinent positives, otherwise complete 10 system ROS negative.  Physical Exam: BP 102/69 mmHg  Pulse 81  Temp(Src) 97.9 F (36.6 C) (Oral)  Resp 16  Ht _0  (1.549 m)  Wt 126 lb (57.153 kg)  BMI 23.82 kg/m2  SpO2 100% Body mass index is 23.82 kg/(m^2).   General Appearance:  Alert, cooperative, no distress, appears stated age  Head:  Normocephalic, without obvious abnormality, atraumatic  ENT: Unremarkable  Neck: Supple, symmetrical, trachea midline  Lungs:   Clear to auscultation bilaterally, no w/r/r, respirations unlabored without use of accessory muscles.  Chest Wall:  No tenderness or deformity  Heart:  Regular rate and rhythm, S1, S2 normal, no murmur, rub or gallop.  Abdomen:   Soft, non-tender, non distended.  Neurologic: Normal affect, no gross deficits.  Labs: Results for orders placed or performed during the hospital encounter of 09/20/14 (from the past 48 hour(s))   APTT     Status: Abnormal   Collection Time: 09/20/14  7:35 AM  Result Value Ref Range   aPTT 53 (H) 24 - 37 seconds    Comment:        IF BASELINE aPTT IS ELEVATED, SUGGEST PATIENT RISK ASSESSMENT BE USED TO DETERMINE APPROPRIATE ANTICOAGULANT THERAPY.   Basic metabolic panel     Status: Abnormal   Collection Time: 09/20/14  7:35 AM  Result Value Ref Range   Sodium 137 135 - 145 mmol/L   Potassium 3.9 3.5 - 5.1 mmol/L   Chloride 105 96 - 112 mmol/L   CO2 26 19 - 32 mmol/L   Glucose, Bld 83 70 - 99 mg/dL   BUN 9 6 - 23 mg/dL   Creatinine, Ser 0.56 0.50 - 1.10 mg/dL   Calcium 7.7 (L) 8.4 - 10.5 mg/dL   GFR calc non Af Amer >90 >90 mL/min   GFR calc Af Amer >90 >90 mL/min    Comment: (NOTE) The eGFR has been calculated using the CKD EPI equation. This calculation has not been validated in all clinical situations. eGFR's persistently <90 mL/min signify possible Chronic Kidney Disease.    Anion gap 6 5 - 15  CBC with Differential/Platelet     Status: Abnormal   Collection Time: 09/20/14  7:35 AM  Result Value Ref Range   WBC 8.6 4.0 - 10.5 K/uL   RBC 4.64 3.87 - 5.11 MIL/uL   Hemoglobin  13.1 12.0 - 15.0 g/dL   HCT 39.3 36.0 - 46.0 %   MCV 84.7 78.0 - 100.0 fL   MCH 28.2 26.0 - 34.0 pg   MCHC 33.3 30.0 - 36.0 g/dL   RDW 17.1 (H) 11.5 - 15.5 %   Platelets 356 150 - 400 K/uL   Neutrophils Relative % 62 43 - 77 %   Neutro Abs 5.3 1.7 - 7.7 K/uL   Lymphocytes Relative 29 12 - 46 %   Lymphs Abs 2.5 0.7 - 4.0 K/uL   Monocytes Relative 6 3 - 12 %   Monocytes Absolute 0.5 0.1 - 1.0 K/uL   Eosinophils Relative 3 0 - 5 %   Eosinophils Absolute 0.3 0.0 - 0.7 K/uL   Basophils Relative 0 0 - 1 %   Basophils Absolute 0.0 0.0 - 0.1 K/uL  Protime-INR     Status: Abnormal   Collection Time: 09/20/14  7:35 AM  Result Value Ref Range   Prothrombin Time 17.5 (H) 11.6 - 15.2 seconds   INR 1.42 0.00 - 1.49    Imaging: No results found.  Assessment/Plan Amyloidosis For CT guided  bone marrow biopsy. Risks and Benefits discussed with the patient including, but not limited to bleeding, infection, damage to adjacent structures or low yield requiring additional tests. All of the patient's questions were answered, patient is agreeable to proceed. Consent signed and in chart.   Ascencion Dike PA-C 09/20/2014, 8:20 AM

## 2014-09-20 NOTE — Discharge Instructions (Signed)
Bone Marrow Aspiration, Bone Marrow Biopsy °Care After °Read the instructions outlined below and refer to this sheet in the next few weeks. These discharge instructions provide you with general information on caring for yourself after you leave the hospital. Your caregiver may also give you specific instructions. While your treatment has been planned according to the most current medical practices available, unavoidable complications occasionally occur. If you have any problems or questions after discharge, call your caregiver. °FINDING OUT THE RESULTS OF YOUR TEST °Not all test results are available during your visit. If your test results are not back during the visit, make an appointment with your caregiver to find out the results. Do not assume everything is normal if you have not heard from your caregiver or the medical facility. It is important for you to follow up on all of your test results.  °HOME CARE INSTRUCTIONS  °You have had sedation and may be sleepy or dizzy. Your thinking may not be as clear as usual. For the next 24 hours: °· Only take over-the-counter or prescription medicines for pain, discomfort, and or fever as directed by your caregiver. °· Do not drink alcohol. °· Do not smoke. °· Do not drive. °· Do not make important legal decisions. °· Do not operate heavy machinery. °· Do not care for small children by yourself. °· Keep your dressing clean and dry. You may replace dressing with a bandage after 24 hours. °· You may take a bath or shower after 24 hours. °· Use an ice pack for 20 minutes every 2 hours while awake for pain as needed. °SEEK MEDICAL CARE IF:  °· There is redness, swelling, or increasing pain at the biopsy site. °· There is pus coming from the biopsy site. °· There is drainage from a biopsy site lasting longer than one day. °· An unexplained oral temperature above 102° F (38.9° C) develops. °SEEK IMMEDIATE MEDICAL CARE IF:  °· You develop a rash. °· You have difficulty  breathing. °· You develop any reaction or side effects to medications given. °Document Released: 12/28/2004 Document Revised: 09/02/2011 Document Reviewed: 06/07/2008 °ExitCare® Patient Information ©2015 [employer]. This information is not intended to replace advice given to you by your health care provider. Make sure you discuss any questions you have with your health care provider. °Conscious Sedation, Adult, Care After °Refer to this sheet in the next few weeks. These instructions provide you with information on caring for yourself after your procedure. Your health care provider may also give you more specific instructions. Your treatment has been planned according to current medical practices, but problems sometimes occur. Call your health care provider if you have any problems or questions after your procedure. °WHAT TO EXPECT AFTER THE PROCEDURE  °After your procedure: °· You may feel sleepy, clumsy, and have poor balance for several hours. °· Vomiting may occur if you eat too soon after the procedure. °HOME CARE INSTRUCTIONS °· Do not participate in any activities where you could become injured for at least 24 hours. Do not: °¨ Drive. °¨ Swim. °¨ Ride a bicycle. °¨ Operate heavy machinery. °¨ Cook. °¨ Use power tools. °¨ Climb ladders. °¨ Work from a high place. °· Do not make important decisions or sign legal documents until you are improved. °· If you vomit, drink water, juice, or soup when you can drink without vomiting. Make sure you have little or no nausea before eating solid foods. °· Only take over-the-counter or prescription medicines for pain, discomfort, or fever   as directed by your health care provider. °· Make sure you and your family fully understand everything about the medicines given to you, including what side effects may occur. °· You should not drink alcohol, take sleeping pills, or take medicines that cause drowsiness for at least 24 hours. °· If you smoke, do not smoke without  supervision. °· If you are feeling better, you may resume normal activities 24 hours after you were sedated. °· Keep all appointments with your health care provider. °SEEK MEDICAL CARE IF: °· Your skin is pale or bluish in color. °· You continue to feel nauseous or vomit. °· Your pain is getting worse and is not helped by medicine. °· You have bleeding or swelling. °· You are still sleepy or feeling clumsy after 24 hours. °SEEK IMMEDIATE MEDICAL CARE IF: °· You develop a rash. °· You have difficulty breathing. °· You develop any type of allergic problem. °· You have a fever. °MAKE SURE YOU: °· Understand these instructions. °· Will watch your condition. °· Will get help right away if you are not doing well or get worse. °Document Released: 03/31/2013 Document Reviewed: 03/31/2013 °ExitCare® Patient Information ©2015 [employer]. This information is not intended to replace advice given to you by your health care provider. Make sure you discuss any questions you have with your health care provider. ° °

## 2014-09-23 ENCOUNTER — Encounter: Payer: Self-pay | Admitting: *Deleted

## 2014-09-23 ENCOUNTER — Ambulatory Visit (HOSPITAL_BASED_OUTPATIENT_CLINIC_OR_DEPARTMENT_OTHER): Payer: 59 | Admitting: Oncology

## 2014-09-23 VITALS — BP 94/57 | HR 84 | Temp 97.6°F | Resp 19 | Ht 61.0 in | Wt 136.5 lb

## 2014-09-23 DIAGNOSIS — D729 Disorder of white blood cells, unspecified: Secondary | ICD-10-CM

## 2014-09-23 DIAGNOSIS — E859 Amyloidosis, unspecified: Secondary | ICD-10-CM

## 2014-09-23 DIAGNOSIS — R808 Other proteinuria: Secondary | ICD-10-CM | POA: Diagnosis not present

## 2014-09-23 DIAGNOSIS — R601 Generalized edema: Secondary | ICD-10-CM

## 2014-09-23 NOTE — Progress Notes (Unsigned)
Chart faxed to [facility], new patient coordinator @ dr Valinda Party office. [phone]. vanda in radiology to fed-ex films with reports on CD, Monday 09-26-14. Request sent to pathology for slides to be fed-exed to [address] [facility] [address] [address], room [phone], [place] New patient appt is for may 24th @ 11:00 am with dr Amalia Hailey. Dr Alen Blew notified. Patient to be seen by him today.

## 2014-09-23 NOTE — Progress Notes (Signed)
Hematology and Oncology Follow Up Visit  Anna Calderon [id number] [birth year] 53 y.o. 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   Prior Therapy: She is status post kidney biopsy done on 08/10/2014 and showed positive staining for amyloidosis.  She is status post bone marrow biopsy on 09/20/2014 which showed 19% plasma cell infiltration suggesting plasma cell neoplasm.   Current therapy:  Evaluation for therapy.  Interim History:   Anna Calderon today for a follow-up visit. Since her last visit, she has not reported any new symptoms. She continues to have lower extremity edema and generalized anasarca. She does report occasional dyspnea when laying flat. Her mobility is not affected but her performance status slightly declined. She continues to be on hydrochlorothiazide which have helped her fluid retention.  She does not report any chest pain or shortness of breath. Did not report any dyspnea on exertion. She did not report any easy bruisability or petechiae. She continues to report lower extremity edema and abdominal wall edema. She still able to work and function properly. She did not report any headaches, blurry vision, double vision, syncope or seizures. She does not report any fevers, chills, sweats, weight loss or abdominal discomfort. Did not report any nausea, vomiting she did report very brief episode of diarrhea which is resolved. She does not report any frequency urgency or hesitancy. She did not report any hematuria. She did not report any skeletal pain, lymphadenopathy or easy bruisability. The remaining review of systems unremarkable  Medications: I have reviewed the patient's current medications.  Current Outpatient Prescriptions  Medication Sig Dispense Refill  .  hydrochlorothiazide (HYDRODIURIL) 25 MG tablet Take 25 mg by mouth daily.    . Vitamin D, Ergocalciferol, (DRISDOL) 50000 UNITS CAPS capsule Take 50,000 Units by mouth every 7 (seven) days.     No current facility-administered medications for this visit.     Allergies: No Known Allergies  Past Medical History, Surgical history, Social history, and Family History were reviewed and updated.   Physical Exam: Blood pressure 94/57, pulse 84, temperature 97.6 F (36.4 C), temperature source Oral, resp. rate 19, height _0  (1.549 m), weight 136 lb 8 oz (61.916 kg), SpO2 99 %. ECOG: 0 General appearance: alert and cooperative Head: Normocephalic, without obvious abnormality Neck: no adenopathy Lymph nodes: Cervical, supraclavicular, and axillary nodes normal. Heart:regular rate and rhythm, S1, S2 normal, no murmur, click, rub or gallop Lung:chest clear, no wheezing, rales, normal symmetric air entry. Abdomin: soft, non-tender, without masses or organomegaly.  Ascites noted  With shifting dullness. EXT:no erythema, induration, or nodules.  2+ edema noted.   Lab Results: Lab Results  Component Value Date   WBC 8.6 09/20/2014   HGB 13.1 09/20/2014   HCT 39.3 09/20/2014   MCV 84.7 09/20/2014   PLT 356 09/20/2014     Chemistry      Component Value Date/Time   NA 137 09/20/2014 0735   NA 139 specimen lipemic 09/07/2014 1144   K 3.9 09/20/2014 0735   K 3.8 09/07/2014 1144   CL 105 09/20/2014 0735   CO2 26 09/20/2014 0735   CO2 27 09/07/2014 1144   BUN 9 09/20/2014 0735   BUN 7.4 09/07/2014 1144   CREATININE 0.56 09/20/2014 0735   CREATININE 0.6 09/07/2014 1144      Component Value Date/Time  CALCIUM 7.7* 09/20/2014 0735   CALCIUM 7.6* 09/07/2014 1144   ALKPHOS 65 09/07/2014 1144   AST 25 09/07/2014 1144   ALT 19 09/07/2014 1144   BILITOT <0.20 09/07/2014 1144       Radiological Studies:  EXAM: CT CHEST, ABDOMEN, AND PELVIS WITH  CONTRAST  TECHNIQUE: Multidetector CT imaging of the chest, abdomen and pelvis was performed following the standard protocol during bolus administration of intravenous contrast.  CONTRAST: 165m OMNIPAQUE IOHEXOL 300 MG/ML SOLN  COMPARISON: No priors.  FINDINGS: CT CHEST FINDINGS  Mediastinum/Lymph Nodes: Heart size is normal. There is no significant pericardial fluid, thickening or pericardial calcification. No pathologically enlarged mediastinal or hilar lymph nodes. Esophagus is unremarkable in appearance. No axillary lymphadenopathy.  Lungs/Pleura: Small right and trace left pleural effusions layering dependently. No acute consolidative airspace disease. No pleural effusions. Minimal dependent atelectasis in the lower lobes of the lungs bilaterally. No suspicious appearing pulmonary nodules or masses.  Musculoskeletal/Soft Tissues: There are no aggressive appearing lytic or blastic lesions noted in the visualized portions of the skeleton.  CT ABDOMEN AND PELVIS FINDINGS  Hepatobiliary: No cystic or solid hepatic lesions. No intra or extrahepatic biliary ductal dilatation. No radiopaque gallstones. Gallbladder is only moderately distended. Gallbladder wall appears edematous measuring 8 mm in thickness.  Pancreas: Unremarkable.  Spleen: Spleen is borderline enlarged measuring 12.5 x 5.1 x 9.4 cm (estimated splenic volume of 300 mL).  Adrenals/Urinary Tract: Bilateral adrenal glands and bilateral kidneys are normal in appearance. No hydroureteronephrosis. Urinary bladder is normal in appearance.  Stomach/Bowel: The appearance of the stomach is normal. No pathologic dilatation of small bowel or colon. Normal appendix.  Vascular/Lymphatic: No significant atherosclerotic disease, aneurysm or dissection identified in the abdominal and pelvic vasculature. Numerous calcified mesenteric lymph nodes. No other pathologically enlarged lymph nodes noted in  the abdomen or pelvis.  Reproductive: Uterus is enlarged and heterogeneous in appearance with multifocal lesions, presumably a fibroid uterus, estimated to measure approximately 8.2 x 7.4 x 6.4 cm. Ovaries are unremarkable in appearance.  Other: Small volume of ascites most evident in the low anatomic pelvis. No pneumoperitoneum. Diffuse body wall edema.  Musculoskeletal: There are no aggressive appearing lytic or blastic lesions noted in the visualized portions of the skeleton.  IMPRESSION: 1. Diffuse body wall edema, small volume of ascites, and bilateral pleural effusions (small on the right and trace on the left), which may suggest a state of mild anasarca. 2. No other acute findings in the chest, abdomen or pelvis, and no definite malignancy identified. 3. Normal appendix. 4. Gallbladder wall edema, potentially related to underlying anasarca. No definite gallstones. Gallbladder does not appear overly distended to suggest an acute cholecystitis at this time, and there are no definite focal pericholecystic inflammatory changes. If for some reason there is clinical concern for acute cholecystitis, this could be further evaluated with right upper quadrant abdominal ultrasound if clinically appropriate. 5. Probable fibroid uterus, as above.  Impression and Plan:   54year old woman with the following issues:  1. 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 Her free lambda light chain in the serum are elevated at 29 with At the lambda ratio depleted is 0.05. Restaging CT scan did not show any evidence of clear-cut other organ involvement. 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   The natural course of this disease was discussed again with the patient and her husband.  She  will need anti-multiple myeloma therapy to start in the near future an attempt to  slow her proteinuria and control her disease process. She is also a transplant candidate if it is appropriate for this condition.    I have recommended an opinion from [facility] given the rarity of this disease for guidance of therapy. I think she'll be an excellent candidate for Cytoxan, Velcade and dexamethasone combination if that is felt to be the appropriate course of action.    I will make the referral and I will discuss this case with Dr. Amalia Hailey  For treatment recommendation in the meantime.   2. Generalized anasarca: She is currently on diuretics and her blood pressure is borderline. I see no need to increase her diuresis at this time.   3. Follow-up: Will be determined depending on the start of her therapy.   Zola Button, MD 4/1/20164:25 PM

## 2014-09-27 LAB — CHROMOSOME ANALYSIS, BONE MARROW

## 2014-10-03 ENCOUNTER — Encounter: Payer: Self-pay | Admitting: *Deleted

## 2014-10-03 NOTE — Progress Notes (Signed)
Daughter ly in to inquire about referral to [facility], she was not here for her mom's appt. Explained to her that a referral was been made to dr Fransisca Connors @ [facility]. For May 24th @ 11:00. Ly states her parents did not understand d/t language barrier. Gave her appt date and time. She also expressed interest in speaking to someone re: disability for her mom. Daughter ly  escorted to [place] [facility], social worker's office.

## 2014-10-07 ENCOUNTER — Encounter: Payer: Self-pay | Admitting: *Deleted

## 2014-11-01 ENCOUNTER — Encounter (HOSPITAL_COMMUNITY): Payer: Self-pay

## 2014-11-11 ENCOUNTER — Other Ambulatory Visit: Payer: Self-pay | Admitting: Oncology

## 2014-11-14 ENCOUNTER — Telehealth: Payer: Self-pay | Admitting: Oncology

## 2014-11-14 NOTE — Telephone Encounter (Signed)
S/w pt confirming MD visit per 05/23 POF .Marland Kitchen... KJ

## 2014-11-15 ENCOUNTER — Encounter: Payer: Self-pay | Admitting: Oncology

## 2014-11-15 ENCOUNTER — Ambulatory Visit (HOSPITAL_BASED_OUTPATIENT_CLINIC_OR_DEPARTMENT_OTHER): Payer: 59

## 2014-11-15 ENCOUNTER — Telehealth: Payer: Self-pay | Admitting: Oncology

## 2014-11-15 ENCOUNTER — Ambulatory Visit (HOSPITAL_BASED_OUTPATIENT_CLINIC_OR_DEPARTMENT_OTHER): Payer: 59 | Admitting: Oncology

## 2014-11-15 VITALS — BP 90/51 | HR 96 | Temp 97.8°F | Resp 16 | Ht 61.0 in | Wt 125.6 lb

## 2014-11-15 DIAGNOSIS — E858 Other amyloidosis: Secondary | ICD-10-CM | POA: Diagnosis not present

## 2014-11-15 DIAGNOSIS — E8581 Light chain (AL) amyloidosis: Secondary | ICD-10-CM | POA: Insufficient documentation

## 2014-11-15 LAB — CBC WITH DIFFERENTIAL/PLATELET
BASO%: 0.2 % (ref 0.0–2.0)
Basophils Absolute: 0 10*3/uL (ref 0.0–0.1)
EOS%: 3.7 % (ref 0.0–7.0)
Eosinophils Absolute: 0.4 10*3/uL (ref 0.0–0.5)
HEMATOCRIT: 35.5 % (ref 34.8–46.6)
HGB: 12.2 g/dL (ref 11.6–15.9)
LYMPH%: 28.8 % (ref 14.0–49.7)
MCH: 27.9 pg (ref 25.1–34.0)
MCHC: 34.4 g/dL (ref 31.5–36.0)
MCV: 81.2 fL (ref 79.5–101.0)
MONO#: 0.8 10*3/uL (ref 0.1–0.9)
MONO%: 8.4 % (ref 0.0–14.0)
NEUT%: 58.9 % (ref 38.4–76.8)
NEUTROS ABS: 5.9 10*3/uL (ref 1.5–6.5)
Platelets: 325 10*3/uL (ref 145–400)
RBC: 4.37 10*6/uL (ref 3.70–5.45)
RDW: 17.4 % — ABNORMAL HIGH (ref 11.2–14.5)
WBC: 10 10*3/uL (ref 3.9–10.3)
lymph#: 2.9 10*3/uL (ref 0.9–3.3)

## 2014-11-15 LAB — COMPREHENSIVE METABOLIC PANEL (CC13)
ALK PHOS: 62 U/L (ref 40–150)
ALT: 12 U/L (ref 0–55)
AST: 24 U/L (ref 5–34)
Albumin: 1 g/dL — ABNORMAL LOW (ref 3.5–5.0)
Anion Gap: 10 mEq/L (ref 3–11)
BUN: 10.2 mg/dL (ref 7.0–26.0)
CALCIUM: 7 mg/dL — AB (ref 8.4–10.4)
CO2: 25 meq/L (ref 22–29)
CREATININE: 0.5 mg/dL — AB (ref 0.6–1.1)
Chloride: 103 mEq/L (ref 98–109)
EGFR: >90 mL/min/{1.73_m2} (ref >90)
Glucose: 108 mg/dl (ref 70–140)
POTASSIUM: 3.7 meq/L (ref 3.5–5.1)
Sodium: 138 mEq/L (ref 136–145)
Total Bilirubin: <0.2 mg/dL (ref 0.20–1.20)
Total Protein: 4.2 g/dL — ABNORMAL LOW (ref 6.4–8.3)

## 2014-11-15 MED ORDER — PROCHLORPERAZINE MALEATE 10 MG PO TABS: 10.0000 mg | ORAL_TABLET | Freq: Four times a day (QID) | ORAL | Status: DC | PRN

## 2014-11-15 MED ORDER — CYCLOPHOSPHAMIDE 50 MG PO TABS: ORAL_TABLET | ORAL | Status: DC

## 2014-11-15 MED ORDER — DEXAMETHASONE 4 MG PO TABS: ORAL_TABLET | ORAL | Status: DC

## 2014-11-15 MED ORDER — ACYCLOVIR 400 MG PO TABS: 400.0000 mg | ORAL_TABLET | Freq: Two times a day (BID) | ORAL | Status: DC

## 2014-11-15 NOTE — Progress Notes (Signed)
Hematology and Oncology Follow Up Visit  Anna Calderon [id number] 05-22-1961 54 y.o. 11/15/2014 4:00 PM Pcp Not In SystemNo ref. 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 Her disease is involving the bone marrow as well as the kidneys. Very limited cardiac involvement at this time.   Prior Therapy: She is status post kidney biopsy done on 08/10/2014 and showed positive staining for amyloidosis.  She is status post bone marrow biopsy on 09/20/2014 which showed 19% plasma cell infiltration suggesting plasma cell neoplasm.   Current therapy:  Under evaluation to start therapy with Velcade, Cytoxan and dexamethasone.  Interim History:   [name] today for a follow-up visit with her daughter. Since her last visit, she has been evaluated at [facility] with Dr. Amalia Hailey and had a repeat bone marrow biopsy. The sample was sent for confirmation of diagnosis of what type of amyloidosis we are dealing with. The final pathology did confirm the presence of AL amyloidosis. She has been started on Demadex and midodrine which helped her fluid retention. She still weak but feel generally about the same. He is able to eat better her appetite is relatively poor. She does report exertional dyspnea times and cough and orthopnea. She does not report any chest pain or shortness of breath.  She did not report any easy bruisability or petechiae. She continues to report lower extremity edema and abdominal wall edema. She still able to work and function properly. She did not report any headaches, blurry vision, double vision, syncope or seizures. She does not report any fevers, chills, sweats, weight loss or abdominal discomfort. Did not report any nausea, vomiting. She does not report any urgency or hesitancy. She did not report any hematuria. She did not report any  skeletal pain, lymphadenopathy or easy bruisability. The remaining review of systems unremarkable  Medications: I have reviewed the patient's current medications.  Current Outpatient Prescriptions  Medication Sig Dispense Refill  . midodrine (PROAMATINE) 5 MG tablet Take 5 mg by mouth 2 (two) times daily.    . potassium chloride (K-DUR) 10 MEQ tablet Take 20 mEq by mouth 2 (two) times daily.    Marland Kitchen torsemide (DEMADEX) 20 MG tablet Take 25 mg by mouth 2 (two) times daily.    . Vitamin D, Ergocalciferol, (DRISDOL) 50000 UNITS CAPS capsule Take 50,000 Units by mouth every 7 (seven) days.    Marland Kitchen acyclovir (ZOVIRAX) 400 MG tablet Take 1 tablet (400 mg total) by mouth 2 (two) times daily. 60 tablet 1  . cyclophosphamide (CYTOXAN) 50 MG tablet Take 9 tablets weekly on an empty stomach on the day of your chemotherapy. 72 tablet 3  . dexamethasone (DECADRON) 4 MG tablet Take 5 tablets every week with chemotherapy. 60 tablet 3  . prochlorperazine (COMPAZINE) 10 MG tablet Take 1 tablet (10 mg total) by mouth every 6 (six) hours as needed for nausea or vomiting. 30 tablet 0   No current facility-administered medications for this visit.     Allergies: No Known Allergies  Past Medical History, Surgical history, Social history, and Family History were reviewed and updated.   Physical Exam: Blood pressure 90/51, pulse 96, temperature 97.8 F (36.6 C), temperature source Oral, resp. rate 16, height '5\' 1"'  (1.549 m), weight 125 lb 9.6 oz (56.972 kg), SpO2 99 %. ECOG: 0 General appearance: alert and cooperative appeared in no distress. Head: Normocephalic,  without obvious abnormality Neck: no adenopathy Lymph nodes: Cervical, supraclavicular, and axillary nodes normal. Heart:regular rate and rhythm, S1, S2 normal, no murmur, click, rub or gallop Lung:chest clear, no wheezing, rales, normal symmetric air entry. Abdomin: soft, non-tender, without masses or organomegaly.  No shifting dullness noted. EXT:no  erythema, induration, or nodules.  Trace + edema noted.   Lab Results: Lab Results  Component Value Date   WBC 8.6 09/20/2014   HGB 13.1 09/20/2014   HCT 39.3 09/20/2014   MCV 84.7 09/20/2014   PLT 356 09/20/2014     Chemistry      Component Value Date/Time   NA 137 09/20/2014 0735   NA 139 specimen lipemic 09/07/2014 1144   K 3.9 09/20/2014 0735   K 3.8 09/07/2014 1144   CL 105 09/20/2014 0735   CO2 26 09/20/2014 0735   CO2 27 09/07/2014 1144   BUN 9 09/20/2014 0735   BUN 7.4 09/07/2014 1144   CREATININE 0.56 09/20/2014 0735   CREATININE 0.6 09/07/2014 1144      Component Value Date/Time   CALCIUM 7.7* 09/20/2014 0735   CALCIUM 7.6* 09/07/2014 1144   ALKPHOS 65 09/07/2014 1144   AST 25 09/07/2014 1144   ALT 19 09/07/2014 1144   BILITOT <0.20 09/07/2014 1144        Impression and Plan:   54 year old woman with the following issues:  1. AL amyloidosis: Presented with a plasma cell disorder manifesting itself with bone marrow involvement with 19% plasma cells that are lambda light chain specific. Her free lambda light chain in the serum are elevated at 29 with At the lambda ratio depleted is 0.05. Staging CT scan did not show any evidence of clear-cut other organ involvement. 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   Her case was discussed extensively on multiple occasions with Dr. Amalia Hailey at [facility]. He have recommended proceeding with a regimen of Velcade, cyclophosphamide and dexamethasone in the near future. The regimen is based on published article at the Journal of Heamatology in 2014 (Reader et.al.).  She will receive Velcade at 1.5 mg/m subcutaneously on a weekly basis. She will receive cyclophosphamide 300 mg/m (total dose of close to 450 mg) on a weekly basis. She will receive dexamethasone at 20 mg by mouth (reduced from 40 mg because of her fluid retention) on a weekly basis.   Risks and  benefits of the surgery were discussed today with the patient and her daughter again. Complications include nausea, vomiting, myelosuppression and peripheral neuropathy, pancreatitis, injection-related complications among others. She is agreeable to proceed after chemotherapy education class.    2. Generalized anasarca: She is currently on diuretics with good response. I will check her potassium to make sure she is adequately repleted.  3. Antiemetics: Prescription for Compazine was given to the patient today.   4. VZV prophylaxis: Prescription for acyclovir was given to the patient as well.  5. Follow-up: Will be for the start of chemotherapy next week.   Zola Button, MD 5/24/20164:00 PM

## 2014-11-15 NOTE — Progress Notes (Signed)
I faxed biologics for possible cytoxan

## 2014-11-15 NOTE — Telephone Encounter (Signed)
Gave patient/dtr avs report and appointments for June.  °

## 2014-11-15 NOTE — Progress Notes (Signed)
Script for PO cytoxan given to raquel in managed care, for financial assistance

## 2014-11-16 ENCOUNTER — Encounter: Payer: Self-pay | Admitting: Oncology

## 2014-11-16 NOTE — Progress Notes (Signed)
I called and spoke with daughter Renaldo Reel and they need medical records. I have put at front desk with ms. Wilma for pick up 5/26

## 2014-11-17 ENCOUNTER — Other Ambulatory Visit: Payer: 59

## 2014-11-17 ENCOUNTER — Encounter: Payer: Self-pay | Admitting: *Deleted

## 2014-11-17 LAB — SPEP & IFE WITH QIG
ALPHA-2-GLOBULIN: 0.9 g/dL (ref 0.5–0.9)
Abnormal Protein Band1: 0.1 g/dL
Albumin ELP: 1.6 g/dL — ABNORMAL LOW (ref 3.8–4.8)
Alpha-1-Globulin: 0.2 g/dL (ref 0.2–0.3)
BETA 2: 0.2 g/dL (ref 0.2–0.5)
BETA GLOBULIN: 0.1 g/dL — AB (ref 0.4–0.6)
GAMMA GLOBULIN: 0.2 g/dL — AB (ref 0.8–1.7)
IGA: 286 mg/dL (ref 69–380)
IGM, SERUM: 431 mg/dL — AB (ref 52–322)
IgG (Immunoglobin G), Serum: 237 mg/dL — ABNORMAL LOW (ref 690–1700)
Total Protein, Serum Electrophoresis: 3.2 g/dL — ABNORMAL LOW (ref 6.1–8.1)

## 2014-11-17 LAB — KAPPA/LAMBDA LIGHT CHAINS
KAPPA FREE LGHT CHN: 1.53 mg/dL (ref 0.33–1.94)
Kappa:Lambda Ratio: 0.05 — ABNORMAL LOW (ref 0.26–1.65)
Lambda Free Lght Chn: 33 mg/dL — ABNORMAL HIGH (ref 0.57–2.63)

## 2014-11-24 ENCOUNTER — Other Ambulatory Visit (HOSPITAL_BASED_OUTPATIENT_CLINIC_OR_DEPARTMENT_OTHER): Payer: 59

## 2014-11-24 ENCOUNTER — Ambulatory Visit (HOSPITAL_BASED_OUTPATIENT_CLINIC_OR_DEPARTMENT_OTHER): Payer: 59

## 2014-11-24 ENCOUNTER — Encounter: Payer: Self-pay | Admitting: Oncology

## 2014-11-24 VITALS — BP 96/62 | HR 94 | Temp 98.0°F | Resp 20

## 2014-11-24 DIAGNOSIS — E8581 Light chain (AL) amyloidosis: Secondary | ICD-10-CM

## 2014-11-24 DIAGNOSIS — E858 Other amyloidosis: Secondary | ICD-10-CM | POA: Diagnosis not present

## 2014-11-24 LAB — COMPREHENSIVE METABOLIC PANEL (CC13)
ALBUMIN: 1 g/dL — AB (ref 3.5–5.0)
ALT: 14 U/L (ref 0–55)
AST: 31 U/L (ref 5–34)
Alkaline Phosphatase: 79 U/L (ref 40–150)
Anion Gap: 10 mEq/L (ref 3–11)
BUN: 10.7 mg/dL (ref 7.0–26.0)
CALCIUM: 7.5 mg/dL — AB (ref 8.4–10.4)
CO2: 27 mEq/L (ref 22–29)
CREATININE: 0.6 mg/dL (ref 0.6–1.1)
Chloride: 99 mEq/L (ref 98–109)
EGFR: >90 mL/min/{1.73_m2} (ref >90)
Glucose: 136 mg/dl (ref 70–140)
Potassium: 3.7 mEq/L (ref 3.5–5.1)
Sodium: 136 mEq/L (ref 136–145)
Total Protein: 4.5 g/dL — ABNORMAL LOW (ref 6.4–8.3)

## 2014-11-24 LAB — CBC WITH DIFFERENTIAL/PLATELET
BASO%: 0.3 % (ref 0.0–2.0)
BASOS ABS: 0 10*3/uL (ref 0.0–0.1)
EOS%: 0.1 % (ref 0.0–7.0)
Eosinophils Absolute: 0 10*3/uL (ref 0.0–0.5)
HCT: 37.1 % (ref 34.8–46.6)
HGB: 12.6 g/dL (ref 11.6–15.9)
LYMPH%: 12.1 % — ABNORMAL LOW (ref 14.0–49.7)
MCH: 27.6 pg (ref 25.1–34.0)
MCHC: 34 g/dL (ref 31.5–36.0)
MCV: 81.2 fL (ref 79.5–101.0)
MONO#: 0.1 10*3/uL (ref 0.1–0.9)
MONO%: 0.8 % (ref 0.0–14.0)
NEUT#: 9 10*3/uL — ABNORMAL HIGH (ref 1.5–6.5)
NEUT%: 86.7 % — AB (ref 38.4–76.8)
Platelets: 418 10*3/uL — ABNORMAL HIGH (ref 145–400)
RBC: 4.57 10*6/uL (ref 3.70–5.45)
RDW: 17.4 % — ABNORMAL HIGH (ref 11.2–14.5)
WBC: 10.4 10*3/uL — AB (ref 3.9–10.3)
lymph#: 1.3 10*3/uL (ref 0.9–3.3)

## 2014-11-24 MED ORDER — BORTEZOMIB CHEMO IV INJECTION 3.5 MG
1.5000 mg/m2 | Freq: Once | INTRAMUSCULAR | Status: AC
  Administered 2014-11-24: 2.4 mg via INTRAVENOUS
  Filled 2014-11-24: qty 2.4

## 2014-11-24 MED ORDER — SODIUM CHLORIDE 0.9 % IV SOLN
Freq: Once | INTRAVENOUS | Status: AC
  Administered 2014-11-24: 14:00:00 via INTRAVENOUS
  Filled 2014-11-24: qty 4

## 2014-11-24 MED ORDER — SODIUM CHLORIDE 0.9 % IV SOLN
Freq: Once | INTRAVENOUS | Status: AC
  Administered 2014-11-24: 14:00:00 via INTRAVENOUS

## 2014-11-24 NOTE — Progress Notes (Signed)
Patient observed for 30 minutes post Velcade infusion.  No signs or symptoms of distress noted at discharge.

## 2014-11-24 NOTE — Patient Instructions (Signed)
[facility] Discharge Instructions for Patients Receiving Chemotherapy  Today you received the following chemotherapy agents Velcade  To help prevent nausea and vomiting after your treatment, we encourage you to take your nausea medication     If you develop nausea and vomiting that is not controlled by your nausea medication, call the clinic.   BELOW ARE SYMPTOMS THAT SHOULD BE REPORTED IMMEDIATELY:  *FEVER GREATER THAN 100.5 F  *CHILLS WITH OR WITHOUT FEVER  NAUSEA AND VOMITING THAT IS NOT CONTROLLED WITH YOUR NAUSEA MEDICATION  *UNUSUAL SHORTNESS OF BREATH  *UNUSUAL BRUISING OR BLEEDING  TENDERNESS IN MOUTH AND THROAT WITH OR WITHOUT PRESENCE OF ULCERS  *URINARY PROBLEMS  *BOWEL PROBLEMS  UNUSUAL RASH Items with * indicate a potential emergency and should be followed up as soon as possible.  Feel free to call the clinic you have any questions or concerns. The clinic phone number is [phone].  Please show the [place] at check-in to the Emergency Department and triage nurse.  Bortezomib injection What is this medicine? BORTEZOMIB (bor TEZ oh mib) is a chemotherapy drug. It slows the growth of cancer cells. This medicine is used to treat multiple myeloma, and certain lymphomas, such as mantle-cell lymphoma. This medicine may be used for other purposes; ask your health care provider or pharmacist if you have questions. COMMON BRAND NAME(S): Velcade What should I tell my health care provider before I take this medicine? They need to know if you have any of these conditions: -diabetes -heart disease -irregular heartbeat -liver disease -on hemodialysis -low blood counts, like low white blood cells, platelets, or hemoglobin -peripheral neuropathy -taking medicine for blood pressure -an unusual or allergic reaction to bortezomib, mannitol, boron, other medicines, foods, dyes, or preservatives -pregnant or trying to get  pregnant -breast-feeding How should I use this medicine? This medicine is for injection into a vein or for injection under the skin. It is given by a health care professional in a hospital or clinic setting. Talk to your pediatrician regarding the use of this medicine in children. Special care may be needed. Overdosage: If you think you have taken too much of this medicine contact a poison control center or emergency room at once. NOTE: This medicine is only for you. Do not share this medicine with others. What if I miss a dose? It is important not to miss your dose. Call your doctor or health care professional if you are unable to keep an appointment. What may interact with this medicine? This medicine may interact with the following medications: -ketoconazole -rifampin -ritonavir -St. John's Wort This list may not describe all possible interactions. Give your health care provider a list of all the medicines, herbs, non-prescription drugs, or dietary supplements you use. Also tell them if you smoke, drink alcohol, or use illegal drugs. Some items may interact with your medicine. What should I watch for while using this medicine? Visit your doctor for checks on your progress. This drug may make you feel generally unwell. This is not uncommon, as chemotherapy can affect healthy cells as well as cancer cells. Report any side effects. Continue your course of treatment even though you feel ill unless your doctor tells you to stop. You may get drowsy or dizzy. Do not drive, use machinery, or do anything that needs mental alertness until you know how this medicine affects you. Do not stand or sit up quickly, especially if you are an older patient. This reduces the risk of dizzy or  fainting spells. In some cases, you may be given additional medicines to help with side effects. Follow all directions for their use. Call your doctor or health care professional for advice if you get a fever, chills or sore  throat, or other symptoms of a cold or flu. Do not treat yourself. This drug decreases your body's ability to fight infections. Try to avoid being around people who are sick. This medicine may increase your risk to bruise or bleed. Call your doctor or health care professional if you notice any unusual bleeding. You may need blood work done while you are taking this medicine. In some patients, this medicine may cause a serious brain infection that may cause death. If you have any problems seeing, thinking, speaking, walking, or standing, tell your doctor right away. If you cannot reach your doctor, urgently seek other source of medical care. Do not become pregnant while taking this medicine. Women should inform their doctor if they wish to become pregnant or think they might be pregnant. There is a potential for serious side effects to an unborn child. Talk to your health care professional or pharmacist for more information. Do not breast-feed an infant while taking this medicine. Check with your doctor or health care professional if you get an attack of severe diarrhea, nausea and vomiting, or if you sweat a lot. The loss of too much body fluid can make it dangerous for you to take this medicine. What side effects may I notice from receiving this medicine? Side effects that you should report to your doctor or health care professional as soon as possible: -allergic reactions like skin rash, itching or hives, swelling of the face, lips, or tongue -breathing problems -changes in hearing -changes in vision -fast, irregular heartbeat -feeling faint or lightheaded, falls -pain, tingling, numbness in the hands or feet -right upper belly pain -seizures -swelling of the ankles, feet, hands -unusual bleeding or bruising -unusually weak or tired -vomiting -yellowing of the eyes or skin Side effects that usually do not require medical attention (report to your doctor or health care professional if they  continue or are bothersome): -changes in emotions or moods -constipation -diarrhea -loss of appetite -headache -irritation at site where injected -nausea This list may not describe all possible side effects. Call your doctor for medical advice about side effects. You may report side effects to FDA at 1-800-FDA-1088. Where should I keep my medicine? This drug is given in a hospital or clinic and will not be stored at home. NOTE: This sheet is a summary. It may not cover all possible information. If you have questions about this medicine, talk to your doctor, pharmacist, or health care provider.  2015, Elsevier/Gold Standard. (2013-04-05 12:46:32)

## 2014-11-24 NOTE — Progress Notes (Signed)
Per Caprice Red at biologics, cytoxan approved and delivered 11/22/14.

## 2014-11-30 ENCOUNTER — Telehealth: Payer: Self-pay | Admitting: *Deleted

## 2014-11-30 NOTE — Telephone Encounter (Signed)
No answer/busy

## 2014-11-30 NOTE — Telephone Encounter (Signed)
-----   Message from Azzie Glatter, RN sent at 11/24/2014  3:26 PM EDT ----- Regarding: "1st time chemotherapy' Patient received Velcade for the 1st time today, per Dr. Alen Blew.  Tolerated treatment well.

## 2014-12-01 ENCOUNTER — Ambulatory Visit (HOSPITAL_BASED_OUTPATIENT_CLINIC_OR_DEPARTMENT_OTHER): Payer: 59

## 2014-12-01 ENCOUNTER — Other Ambulatory Visit (HOSPITAL_BASED_OUTPATIENT_CLINIC_OR_DEPARTMENT_OTHER): Payer: 59

## 2014-12-01 VITALS — BP 110/62 | HR 95 | Temp 97.0°F | Resp 18

## 2014-12-01 DIAGNOSIS — E8581 Light chain (AL) amyloidosis: Secondary | ICD-10-CM

## 2014-12-01 DIAGNOSIS — E858 Other amyloidosis: Secondary | ICD-10-CM

## 2014-12-01 LAB — CBC WITH DIFFERENTIAL/PLATELET
BASO%: 0 % (ref 0.0–2.0)
Basophils Absolute: 0 10*3/uL (ref 0.0–0.1)
EOS%: 0 % (ref 0.0–7.0)
Eosinophils Absolute: 0 10*3/uL (ref 0.0–0.5)
HCT: 37.8 % (ref 34.8–46.6)
HGB: 13 g/dL (ref 11.6–15.9)
LYMPH%: 6.8 % — ABNORMAL LOW (ref 14.0–49.7)
MCH: 28 pg (ref 25.1–34.0)
MCHC: 34.4 g/dL (ref 31.5–36.0)
MCV: 81.3 fL (ref 79.5–101.0)
MONO#: 0.1 10*3/uL (ref 0.1–0.9)
MONO%: 0.6 % (ref 0.0–14.0)
NEUT%: 92.6 % — ABNORMAL HIGH (ref 38.4–76.8)
NEUTROS ABS: 8 10*3/uL — AB (ref 1.5–6.5)
PLATELETS: 454 10*3/uL — AB (ref 145–400)
RBC: 4.65 10*6/uL (ref 3.70–5.45)
RDW: 18 % — AB (ref 11.2–14.5)
WBC: 8.7 10*3/uL (ref 3.9–10.3)
lymph#: 0.6 10*3/uL — ABNORMAL LOW (ref 0.9–3.3)

## 2014-12-01 LAB — COMPREHENSIVE METABOLIC PANEL (CC13)
ALT: 16 U/L (ref 0–55)
AST: 30 U/L (ref 5–34)
Albumin: 1 g/dL — ABNORMAL LOW (ref 3.5–5.0)
Alkaline Phosphatase: 79 U/L (ref 40–150)
Anion Gap: 8 mEq/L (ref 3–11)
BUN: 9.2 mg/dL (ref 7.0–26.0)
CO2: 26 mEq/L (ref 22–29)
CREATININE: 0.5 mg/dL — AB (ref 0.6–1.1)
Calcium: 7.5 mg/dL — ABNORMAL LOW (ref 8.4–10.4)
Chloride: 100 mEq/L (ref 98–109)
EGFR: >90 mL/min/{1.73_m2} (ref >90)
Glucose: 136 mg/dl (ref 70–140)
Potassium: 3.5 mEq/L (ref 3.5–5.1)
SODIUM: 134 meq/L — AB (ref 136–145)
TOTAL PROTEIN: 4.2 g/dL — AB (ref 6.4–8.3)

## 2014-12-01 MED ORDER — ONDANSETRON HCL 8 MG PO TABS
8.0000 mg | ORAL_TABLET | Freq: Once | ORAL | Status: AC
  Administered 2014-12-01: 8 mg via ORAL

## 2014-12-01 MED ORDER — BORTEZOMIB CHEMO SQ INJECTION 3.5 MG (2.5MG/ML)
1.5000 mg/m2 | Freq: Once | INTRAMUSCULAR | Status: AC
  Administered 2014-12-01: 2.25 mg via SUBCUTANEOUS
  Filled 2014-12-01: qty 2.25

## 2014-12-01 MED ORDER — ONDANSETRON HCL 8 MG PO TABS
ORAL_TABLET | ORAL | Status: AC
  Filled 2014-12-01: qty 1

## 2014-12-01 NOTE — Progress Notes (Signed)
Pt and daughter state they were expecting an injection in the belly. Pt received Velcade IV last week. Velcade route discussed with Johny Drilling, PharmD/ Dr. Alen Blew: Velcade route changed to subcutaneous.  Pt also reports left wrist vein is "hard". Chord noted, no erythema, bruising or flare noted. She reports this is site of last IV. Instructed her to apply warm compress QID, to site. She voiced understanding.

## 2014-12-01 NOTE — Patient Instructions (Signed)
[facility] Discharge Instructions for Patients Receiving Chemotherapy  Today you received the following chemotherapy agents: Velcade. Continue Decadron and Cytoxan weekly as prescribed.  To help prevent nausea and vomiting after your treatment, we encourage you to take your nausea medication: Compazine. Take one every 6 hours as needed.   If you develop nausea and vomiting that is not controlled by your nausea medication, call the clinic.   BELOW ARE SYMPTOMS THAT SHOULD BE REPORTED IMMEDIATELY:  *FEVER GREATER THAN 100.5 F  *CHILLS WITH OR WITHOUT FEVER  NAUSEA AND VOMITING THAT IS NOT CONTROLLED WITH YOUR NAUSEA MEDICATION  *UNUSUAL SHORTNESS OF BREATH  *UNUSUAL BRUISING OR BLEEDING  TENDERNESS IN MOUTH AND THROAT WITH OR WITHOUT PRESENCE OF ULCERS  *URINARY PROBLEMS  *BOWEL PROBLEMS  UNUSUAL RASH Items with * indicate a potential emergency and should be followed up as soon as possible.  Feel free to call the clinic should you have any questions or concerns. The clinic phone number is [phone].  Please show the [facility] at check-in to the Emergency Department and triage nurse.

## 2014-12-08 ENCOUNTER — Ambulatory Visit (HOSPITAL_BASED_OUTPATIENT_CLINIC_OR_DEPARTMENT_OTHER): Payer: 59

## 2014-12-08 ENCOUNTER — Encounter: Payer: Self-pay | Admitting: Physician Assistant

## 2014-12-08 ENCOUNTER — Ambulatory Visit (HOSPITAL_BASED_OUTPATIENT_CLINIC_OR_DEPARTMENT_OTHER): Payer: 59 | Admitting: Physician Assistant

## 2014-12-08 ENCOUNTER — Telehealth: Payer: Self-pay | Admitting: Oncology

## 2014-12-08 ENCOUNTER — Other Ambulatory Visit (HOSPITAL_BASED_OUTPATIENT_CLINIC_OR_DEPARTMENT_OTHER): Payer: 59

## 2014-12-08 VITALS — BP 100/62 | HR 95 | Temp 97.6°F | Resp 17 | Ht 61.0 in | Wt 125.0 lb

## 2014-12-08 DIAGNOSIS — E858 Other amyloidosis: Secondary | ICD-10-CM

## 2014-12-08 DIAGNOSIS — R609 Edema, unspecified: Secondary | ICD-10-CM

## 2014-12-08 DIAGNOSIS — E8581 Light chain (AL) amyloidosis: Secondary | ICD-10-CM

## 2014-12-08 LAB — COMPREHENSIVE METABOLIC PANEL (CC13)
ALT: 18 U/L (ref 0–55)
AST: 25 U/L (ref 5–34)
Albumin: 0.9 g/dL — ABNORMAL LOW (ref 3.5–5.0)
Alkaline Phosphatase: 73 U/L (ref 40–150)
Anion Gap: 7 mEq/L (ref 3–11)
BUN: 11.1 mg/dL (ref 7.0–26.0)
CALCIUM: 7.4 mg/dL — AB (ref 8.4–10.4)
CO2: 29 mEq/L (ref 22–29)
CREATININE: 0.6 mg/dL (ref 0.6–1.1)
Chloride: 98 mEq/L (ref 98–109)
EGFR: >90 mL/min/{1.73_m2} (ref >90)
Glucose: 130 mg/dl (ref 70–140)
POTASSIUM: 3.2 meq/L — AB (ref 3.5–5.1)
Sodium: 134 mEq/L — ABNORMAL LOW (ref 136–145)
Total Bilirubin: 0.21 mg/dL (ref 0.20–1.20)
Total Protein: 4.1 g/dL — ABNORMAL LOW (ref 6.4–8.3)

## 2014-12-08 LAB — CBC WITH DIFFERENTIAL/PLATELET
BASO%: 0 % (ref 0.0–2.0)
Basophils Absolute: 0 10*3/uL (ref 0.0–0.1)
EOS ABS: 0 10*3/uL (ref 0.0–0.5)
EOS%: 0 % (ref 0.0–7.0)
HCT: 35.2 % (ref 34.8–46.6)
HEMOGLOBIN: 12.3 g/dL (ref 11.6–15.9)
LYMPH#: 0.4 10*3/uL — AB (ref 0.9–3.3)
LYMPH%: 6.3 % — AB (ref 14.0–49.7)
MCH: 28.3 pg (ref 25.1–34.0)
MCHC: 34.9 g/dL (ref 31.5–36.0)
MCV: 80.9 fL (ref 79.5–101.0)
MONO#: 0 10*3/uL — AB (ref 0.1–0.9)
MONO%: 0.6 % (ref 0.0–14.0)
NEUT%: 93.1 % — ABNORMAL HIGH (ref 38.4–76.8)
NEUTROS ABS: 6.4 10*3/uL (ref 1.5–6.5)
Platelets: 447 10*3/uL — ABNORMAL HIGH (ref 145–400)
RBC: 4.35 10*6/uL (ref 3.70–5.45)
RDW: 18.1 % — AB (ref 11.2–14.5)
WBC: 6.9 10*3/uL (ref 3.9–10.3)

## 2014-12-08 MED ORDER — BORTEZOMIB CHEMO SQ INJECTION 3.5 MG (2.5MG/ML)
1.5000 mg/m2 | Freq: Once | INTRAMUSCULAR | Status: AC
  Administered 2014-12-08: 2.25 mg via SUBCUTANEOUS
  Filled 2014-12-08: qty 2.25

## 2014-12-08 MED ORDER — ONDANSETRON HCL 8 MG PO TABS
8.0000 mg | ORAL_TABLET | Freq: Once | ORAL | Status: AC
  Administered 2014-12-08: 8 mg via ORAL

## 2014-12-08 MED ORDER — ONDANSETRON HCL 8 MG PO TABS
ORAL_TABLET | ORAL | Status: AC
  Filled 2014-12-08: qty 1

## 2014-12-08 NOTE — Patient Instructions (Addendum)
[facility] Discharge Instructions for Patients Receiving Chemotherapy  Today you received the following chemotherapy agents :  velcade  To help prevent nausea and vomiting after your treatment, we encourage you to take your nausea medication. If you develop nausea and vomiting that is not controlled by your nausea medication, call the clinic.   BELOW ARE SYMPTOMS THAT SHOULD BE REPORTED IMMEDIATELY:  *FEVER GREATER THAN 100.5 F  *CHILLS WITH OR WITHOUT FEVER  NAUSEA AND VOMITING THAT IS NOT CONTROLLED WITH YOUR NAUSEA MEDICATION  *UNUSUAL SHORTNESS OF BREATH  *UNUSUAL BRUISING OR BLEEDING  TENDERNESS IN MOUTH AND THROAT WITH OR WITHOUT PRESENCE OF ULCERS  *URINARY PROBLEMS  *BOWEL PROBLEMS  UNUSUAL RASH Items with * indicate a potential emergency and should be followed up as soon as possible.  Feel free to call the clinic you have any questions or concerns. The clinic phone number is [phone].  Please show the [place] at check-in to the Emergency Department and triage nurse.  Potassium is low.  Take 2 tabs of you potassium twice daily.

## 2014-12-08 NOTE — Telephone Encounter (Signed)
Gave and printed appt sched and avs for pt for June..NO pof

## 2014-12-08 NOTE — Progress Notes (Signed)
Hematology and Oncology Follow Up Visit  Anna Calderon [id number] 1961/02/03 54 y.o. 12/08/2014 3:56 PM Pcp Not In SystemNo ref. 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 Her disease is involving the bone marrow as well as the kidneys. Very limited cardiac involvement at this time.   Prior Therapy: She is status post kidney biopsy done on 08/10/2014 and showed positive staining for amyloidosis.  She is status post bone marrow biopsy on 09/20/2014 which showed 19% plasma cell infiltration suggesting plasma cell neoplasm.   Current therapy:   Therapy with Velcade, Cytoxan and dexamethasone. Here today for week #3  Interim History:   Anna Calderon today for a follow-up visit with her husband. Since her last visit, she has been evaluated at [facility] with Dr. Amalia Hailey and had a repeat bone marrow biopsy. The sample was sent for confirmation of diagnosis of what type of amyloidosis we are dealing with. The final pathology did confirm the presence of AL amyloidosis. She has been started on Demadex 25 mg twice daily and potassium chloride 20 mEq by mouth twice daily while she was seen at [facility]. She states she is only taking 1 (10 mEq ) capsule of the potassium daily. The Demadex has been helpful with her fluid retention. Thus far she's tolerating the weekly Velcade injections without difficulty and is tolerating the Cytoxan. Her appetite is improved somewhat..  She does report exertional dyspnea times and notes decreased cough and orthopnea. She does not report any chest pain or shortness of breath.  She did not report any easy bruisability or petechiae. She continues to report lower extremity edema and abdominal wall edema. She still able to work and function properly. She did not report any headaches, blurry vision, double vision, syncope or  seizures. She does not report any fevers, chills, sweats, weight loss or abdominal discomfort. Did not report any nausea, vomiting. She does not report any urgency or hesitancy. She did not report any hematuria. She did not report any skeletal pain, lymphadenopathy or easy bruisability. The remaining review of systems unremarkable  Medications: I have reviewed the patient's current medications.  Current Outpatient Prescriptions  Medication Sig Dispense Refill  . acyclovir (ZOVIRAX) 400 MG tablet Take 1 tablet (400 mg total) by mouth 2 (two) times daily. 60 tablet 1  . cyclophosphamide (CYTOXAN) 50 MG tablet Take 9 tablets weekly on an empty stomach on the day of your chemotherapy. 72 tablet 3  . dexamethasone (DECADRON) 4 MG tablet Take 5 tablets every week with chemotherapy. 60 tablet 3  . midodrine (PROAMATINE) 5 MG tablet Take 5 mg by mouth 2 (two) times daily.    . potassium chloride (K-DUR) 10 MEQ tablet Take 20 mEq by mouth 2 (two) times daily.    . prochlorperazine (COMPAZINE) 10 MG tablet Take 1 tablet (10 mg total) by mouth every 6 (six) hours as needed for nausea or vomiting. 30 tablet 0  . torsemide (DEMADEX) 20 MG tablet Take 25 mg by mouth 2 (two) times daily.    . Vitamin D, Ergocalciferol, (DRISDOL) 50000 UNITS CAPS capsule Take 50,000 Units by mouth every 7 (seven) days.     No current facility-administered medications for this visit.     Allergies: No Known Allergies  Past Medical History, Surgical history, Social history, and Family History were reviewed and updated.   Physical Exam: Blood pressure 100/62,  pulse 95, temperature 97.6 F (36.4 C), temperature source Oral, resp. rate 17, height _0  (1.549 m), weight 125 lb (56.7 kg), SpO2 98 %. ECOG: 0 General appearance: alert and cooperative appeared in no distress. Head: Normocephalic, without obvious abnormality Neck: no adenopathy Lymph nodes: Cervical, supraclavicular, and axillary nodes normal. Heart:regular  rate and rhythm, S1, S2 normal, no murmur, click, rub or gallop Lung:chest clear, no wheezing, rales, normal symmetric air entry. Abdomin: soft, non-tender, without masses or organomegaly.  No shifting dullness noted. EXT:no erythema, induration, or nodules.  1+ -2 + edema noted.   Lab Results: Lab Results  Component Value Date   WBC 6.9 12/08/2014   HGB 12.3 12/08/2014   HCT 35.2 12/08/2014   MCV 80.9 12/08/2014   PLT 447* 12/08/2014     Chemistry      Component Value Date/Time   NA 134* 12/08/2014 1330   NA 137 09/20/2014 0735   K 3.2* 12/08/2014 1330   K 3.9 09/20/2014 0735   CL 105 09/20/2014 0735   CO2 29 12/08/2014 1330   CO2 26 09/20/2014 0735   BUN 11.1 12/08/2014 1330   BUN 9 09/20/2014 0735   CREATININE 0.6 12/08/2014 1330   CREATININE 0.56 09/20/2014 0735      Component Value Date/Time   CALCIUM 7.4* 12/08/2014 1330   CALCIUM 7.7* 09/20/2014 0735   ALKPHOS 73 12/08/2014 1330   AST 25 12/08/2014 1330   ALT 18 12/08/2014 1330   BILITOT 0.21 12/08/2014 1330        Impression and Plan:   54 year old woman with the following issues:  1. AL amyloidosis: Presented with a plasma cell disorder manifesting itself with bone marrow involvement with 19% plasma cells that are lambda light chain specific. Her free lambda light chain in the serum are elevated at 29 with At the lambda ratio depleted is 0.05. Staging CT scan did not show any evidence of clear-cut other organ involvement. 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   Her case was discussed extensively on multiple occasions with Dr. Amalia Hailey at [facility] He have recommended proceeding with a regimen of Velcade, cyclophosphamide and dexamethasone in the near future. The regimen is based on published article at the Journal of Heamatology in 2014 (Reader et.al.).  She will receive Velcade at 1.5 mg/m subcutaneously on a weekly basis. She will receive  cyclophosphamide 300 mg/m (total dose of close to 450 mg) on a weekly basis. She will receive dexamethasone at 20 mg by mouth (reduced from 40 mg because of her fluid retention) on a weekly basis.   2. Generalized anasarca: She is currently on diuretics with good response. Her potassium is low at 3.2 however she is not taking her potassium as prescribed. She was advised to take potassium 20 mEq by mouth twice daily as prescribed. Patient voiced understanding.   3. Antiemetics: Continue Compazine as needed/prescribed    4. VZV prophylaxis: Continue acyclovir as prescribed   5. Follow-up: Will be in 2 weeks .  Carlton Adam, PA-C  6/16/20163:56 PM

## 2014-12-12 LAB — KAPPA/LAMBDA LIGHT CHAINS
Kappa free light chain: 0.73 mg/dL (ref 0.33–1.94)
Kappa:Lambda Ratio: 0.32 (ref 0.26–1.65)
Lambda Free Lght Chn: 2.29 mg/dL (ref 0.57–2.63)

## 2014-12-12 LAB — SPEP & IFE WITH QIG
ALBUMIN ELP: 1.7 g/dL — AB (ref 3.8–4.8)
ALPHA-1-GLOBULIN: 0.1 g/dL — AB (ref 0.2–0.3)
Alpha-2-Globulin: 0.9 g/dL (ref 0.5–0.9)
BETA GLOBULIN: 0.1 g/dL — AB (ref 0.4–0.6)
Beta 2: 0.2 g/dL (ref 0.2–0.5)
Gamma Globulin: 0.1 g/dL — ABNORMAL LOW (ref 0.8–1.7)
IgA: 205 mg/dL (ref 69–380)
IgG (Immunoglobin G), Serum: 155 mg/dL — ABNORMAL LOW (ref 690–1700)
IgM, Serum: 365 mg/dL — ABNORMAL HIGH (ref 52–322)
Total Protein, Serum Electrophoresis: 3.2 g/dL — ABNORMAL LOW (ref 6.1–8.1)

## 2014-12-14 NOTE — Patient Instructions (Signed)
Take your potassium as prescribed 20 mEq (2 capsules) by mouth twice daily Take the Cytoxan as prescribed Continue labs and chemotherapy as scheduled Follow-up in 2 weeks

## 2014-12-15 ENCOUNTER — Other Ambulatory Visit (HOSPITAL_BASED_OUTPATIENT_CLINIC_OR_DEPARTMENT_OTHER): Payer: 59

## 2014-12-15 ENCOUNTER — Ambulatory Visit (HOSPITAL_BASED_OUTPATIENT_CLINIC_OR_DEPARTMENT_OTHER): Payer: 59

## 2014-12-15 VITALS — BP 97/59 | HR 92 | Temp 97.9°F | Resp 18

## 2014-12-15 DIAGNOSIS — E8581 Light chain (AL) amyloidosis: Secondary | ICD-10-CM

## 2014-12-15 DIAGNOSIS — E858 Other amyloidosis: Secondary | ICD-10-CM | POA: Diagnosis not present

## 2014-12-15 LAB — CBC WITH DIFFERENTIAL/PLATELET
BASO%: 0 % (ref 0.0–2.0)
Basophils Absolute: 0 10*3/uL (ref 0.0–0.1)
EOS%: 0 % (ref 0.0–7.0)
Eosinophils Absolute: 0 10*3/uL (ref 0.0–0.5)
HEMATOCRIT: 35.6 % (ref 34.8–46.6)
HGB: 12.1 g/dL (ref 11.6–15.9)
LYMPH#: 0.5 10*3/uL — AB (ref 0.9–3.3)
LYMPH%: 8.9 % — ABNORMAL LOW (ref 14.0–49.7)
MCH: 28.1 pg (ref 25.1–34.0)
MCHC: 34 g/dL (ref 31.5–36.0)
MCV: 82.6 fL (ref 79.5–101.0)
MONO#: 0 10*3/uL — ABNORMAL LOW (ref 0.1–0.9)
MONO%: 0.5 % (ref 0.0–14.0)
NEUT#: 5 10*3/uL (ref 1.5–6.5)
NEUT%: 90.6 % — AB (ref 38.4–76.8)
Platelets: 416 10*3/uL — ABNORMAL HIGH (ref 145–400)
RBC: 4.31 10*6/uL (ref 3.70–5.45)
RDW: 18.9 % — ABNORMAL HIGH (ref 11.2–14.5)
WBC: 5.5 10*3/uL (ref 3.9–10.3)

## 2014-12-15 LAB — COMPREHENSIVE METABOLIC PANEL (CC13)
ALK PHOS: 77 U/L (ref 40–150)
ALT: 16 U/L (ref 0–55)
ANION GAP: 6 meq/L (ref 3–11)
AST: 22 U/L (ref 5–34)
Albumin: 1 g/dL — ABNORMAL LOW (ref 3.5–5.0)
BUN: 6.5 mg/dL — AB (ref 7.0–26.0)
CO2: 29 meq/L (ref 22–29)
CREATININE: 0.6 mg/dL (ref 0.6–1.1)
Calcium: 7.5 mg/dL — ABNORMAL LOW (ref 8.4–10.4)
Chloride: 101 mEq/L (ref 98–109)
Glucose: 124 mg/dl (ref 70–140)
Potassium: 3.7 mEq/L (ref 3.5–5.1)
SODIUM: 136 meq/L (ref 136–145)
TOTAL PROTEIN: 4 g/dL — AB (ref 6.4–8.3)

## 2014-12-15 MED ORDER — BORTEZOMIB CHEMO SQ INJECTION 3.5 MG (2.5MG/ML)
1.5000 mg/m2 | Freq: Once | INTRAMUSCULAR | Status: AC
  Administered 2014-12-15: 2.25 mg via SUBCUTANEOUS
  Filled 2014-12-15: qty 2.25

## 2014-12-15 MED ORDER — SODIUM CHLORIDE 0.9 % IV SOLN: Freq: Once | INTRAVENOUS | Status: DC

## 2014-12-15 MED ORDER — ONDANSETRON HCL 8 MG PO TABS
ORAL_TABLET | ORAL | Status: AC
  Filled 2014-12-15: qty 1

## 2014-12-15 MED ORDER — ONDANSETRON HCL 8 MG PO TABS: 8.0000 mg | ORAL_TABLET | Freq: Once | ORAL | Status: DC

## 2014-12-15 NOTE — Patient Instructions (Signed)
[facility] Discharge Instructions for Patients Receiving Chemotherapy  Today you received the following chemotherapy agents: Velcade  To help prevent nausea and vomiting after your treatment, we encourage you to take your nausea medication as prescribed by your physician.   If you develop nausea and vomiting that is not controlled by your nausea medication, call the clinic.   BELOW ARE SYMPTOMS THAT SHOULD BE REPORTED IMMEDIATELY:  *FEVER GREATER THAN 100.5 F  *CHILLS WITH OR WITHOUT FEVER  NAUSEA AND VOMITING THAT IS NOT CONTROLLED WITH YOUR NAUSEA MEDICATION  *UNUSUAL SHORTNESS OF BREATH  *UNUSUAL BRUISING OR BLEEDING  TENDERNESS IN MOUTH AND THROAT WITH OR WITHOUT PRESENCE OF ULCERS  *URINARY PROBLEMS  *BOWEL PROBLEMS  UNUSUAL RASH Items with * indicate a potential emergency and should be followed up as soon as possible.  Feel free to call the clinic you have any questions or concerns. The clinic phone number is [phone].  Please show the CHEMO ALERT CARD at check-in to the Emergency Department and triage nurse.   

## 2014-12-15 NOTE — Progress Notes (Signed)
Pt reports shortness of breath when lying flat. Pt states that this is not new and started prior to treatment and that it remains unchanged.

## 2014-12-22 ENCOUNTER — Ambulatory Visit (HOSPITAL_BASED_OUTPATIENT_CLINIC_OR_DEPARTMENT_OTHER): Payer: 59 | Admitting: Oncology

## 2014-12-22 ENCOUNTER — Ambulatory Visit (HOSPITAL_BASED_OUTPATIENT_CLINIC_OR_DEPARTMENT_OTHER): Payer: 59

## 2014-12-22 ENCOUNTER — Telehealth: Payer: Self-pay | Admitting: Oncology

## 2014-12-22 ENCOUNTER — Other Ambulatory Visit: Payer: Self-pay | Admitting: Oncology

## 2014-12-22 ENCOUNTER — Other Ambulatory Visit (HOSPITAL_BASED_OUTPATIENT_CLINIC_OR_DEPARTMENT_OTHER): Payer: 59

## 2014-12-22 VITALS — BP 98/58 | HR 91 | Temp 97.6°F | Resp 18 | Ht 61.0 in | Wt 131.2 lb

## 2014-12-22 DIAGNOSIS — E858 Other amyloidosis: Secondary | ICD-10-CM

## 2014-12-22 DIAGNOSIS — R601 Generalized edema: Secondary | ICD-10-CM

## 2014-12-22 DIAGNOSIS — E8581 Light chain (AL) amyloidosis: Secondary | ICD-10-CM

## 2014-12-22 LAB — CBC WITH DIFFERENTIAL/PLATELET
BASO%: 0 % (ref 0.0–2.0)
Basophils Absolute: 0 10*3/uL (ref 0.0–0.1)
EOS ABS: 0 10*3/uL (ref 0.0–0.5)
EOS%: 0 % (ref 0.0–7.0)
HCT: 35.2 % (ref 34.8–46.6)
HGB: 12.1 g/dL (ref 11.6–15.9)
LYMPH%: 4.2 % — ABNORMAL LOW (ref 14.0–49.7)
MCH: 28 pg (ref 25.1–34.0)
MCHC: 34.4 g/dL (ref 31.5–36.0)
MCV: 81.5 fL (ref 79.5–101.0)
MONO#: 0 10*3/uL — AB (ref 0.1–0.9)
MONO%: 0.5 % (ref 0.0–14.0)
NEUT#: 7.3 10*3/uL — ABNORMAL HIGH (ref 1.5–6.5)
NEUT%: 95.3 % — ABNORMAL HIGH (ref 38.4–76.8)
PLATELETS: 417 10*3/uL — AB (ref 145–400)
RBC: 4.32 10*6/uL (ref 3.70–5.45)
RDW: 19.2 % — AB (ref 11.2–14.5)
WBC: 7.6 10*3/uL (ref 3.9–10.3)
lymph#: 0.3 10*3/uL — ABNORMAL LOW (ref 0.9–3.3)
nRBC: 0 % (ref 0–0)

## 2014-12-22 LAB — COMPREHENSIVE METABOLIC PANEL (CC13)
ALT: 17 U/L (ref 0–55)
ANION GAP: 10 meq/L (ref 3–11)
AST: 23 U/L (ref 5–34)
Albumin: 1 g/dL — ABNORMAL LOW (ref 3.5–5.0)
Alkaline Phosphatase: 82 U/L (ref 40–150)
BUN: 7.9 mg/dL (ref 7.0–26.0)
CHLORIDE: 99 meq/L (ref 98–109)
CO2: 25 mEq/L (ref 22–29)
CREATININE: 0.6 mg/dL (ref 0.6–1.1)
Calcium: 7.4 mg/dL — ABNORMAL LOW (ref 8.4–10.4)
EGFR: >90 mL/min/{1.73_m2} (ref >90)
Glucose: 122 mg/dl (ref 70–140)
Potassium: 3.4 mEq/L — ABNORMAL LOW (ref 3.5–5.1)
Sodium: 134 mEq/L — ABNORMAL LOW (ref 136–145)
Total Bilirubin: 0.2 mg/dL (ref 0.20–1.20)
Total Protein: 4.2 g/dL — ABNORMAL LOW (ref 6.4–8.3)

## 2014-12-22 MED ORDER — ONDANSETRON HCL 8 MG PO TABS: 8.0000 mg | ORAL_TABLET | Freq: Once | ORAL | Status: DC

## 2014-12-22 MED ORDER — BORTEZOMIB CHEMO SQ INJECTION 3.5 MG (2.5MG/ML)
1.5000 mg/m2 | Freq: Once | INTRAMUSCULAR | Status: AC
  Administered 2014-12-22: 2.25 mg via SUBCUTANEOUS
  Filled 2014-12-22: qty 0.9

## 2014-12-22 NOTE — Progress Notes (Signed)
Hematology and Oncology Follow Up Visit  Anna Calderon [id number] 1961-01-26 54 y.o. 12/22/2014 1:34 PM Pcp Not In SystemNo ref. 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 Her disease is involving the bone marrow as well as the kidneys. Very limited cardiac involvement at this time.   Prior Therapy: She is status post kidney biopsy done on 08/10/2014 and showed positive staining for amyloidosis.  She is status post bone marrow biopsy on 09/20/2014 which showed 19% plasma cell infiltration suggesting plasma cell neoplasm.   Current therapy:   Velcade, Cytoxan and dexamethasone as follows:  Velcade at 1.5 mg/m subcutaneously on a weekly basis. Cyclophosphamide 300 mg/m (total dose of close to 450 mg) on a weekly basis. Dexamethasone at 20 mg by mouth (reduced from 40 mg because of her fluid retention) on a weekly basis.   Here today for week #5  Interim History:   Anna Calderon presents today for a follow-up visit with her daughter. Since her last visit, she tolerated this treatment without any major complications. She has not reported any nausea or vomiting. Her appetite had been reasonable. She continued to have lower extremity edema. She has been started on Demadex 25 mg twice daily and potassium chloride 20 mEq by mouth twice daily which have helped her fluid retention. She does report exertional dyspnea times and notes decreased cough and orthopnea. She does not report any chest pain or shortness of breath.  She did not report any easy bruisability or petechiae. She continues to report lower extremity edema and abdominal wall edema. She still able to work and function properly. She did not report any headaches, blurry vision, double vision, syncope or seizures. She does not report any fevers, chills, sweats, weight loss or abdominal discomfort. Did not report  any nausea, vomiting. She does not report any urgency or hesitancy. She did not report any hematuria. She did not report any skeletal pain, lymphadenopathy or easy bruisability. The remaining review of systems unremarkable  Medications: I have reviewed the patient's current medications.  Current Outpatient Prescriptions  Medication Sig Dispense Refill  . acyclovir (ZOVIRAX) 400 MG tablet Take 1 tablet (400 mg total) by mouth 2 (two) times daily. 60 tablet 1  . cyclophosphamide (CYTOXAN) 50 MG tablet Take 9 tablets weekly on an empty stomach on the day of your chemotherapy. 72 tablet 3  . dexamethasone (DECADRON) 4 MG tablet Take 5 tablets every week with chemotherapy. 60 tablet 3  . midodrine (PROAMATINE) 5 MG tablet Take 5 mg by mouth 2 (two) times daily.    . potassium chloride (K-DUR) 10 MEQ tablet Take 20 mEq by mouth 2 (two) times daily.    . prochlorperazine (COMPAZINE) 10 MG tablet Take 1 tablet (10 mg total) by mouth every 6 (six) hours as needed for nausea or vomiting. 30 tablet 0  . torsemide (DEMADEX) 20 MG tablet Take 25 mg by mouth 2 (two) times daily.    . Vitamin D, Ergocalciferol, (DRISDOL) 50000 UNITS CAPS capsule Take 50,000 Units by mouth every 7 (seven) days.     No current facility-administered medications for this visit.     Allergies: No Known Allergies  Past Medical History, Surgical history, Social history, and Family History were reviewed and updated.   Physical Exam: Blood pressure 98/58, pulse 91, temperature 97.6 F (36.4 C), temperature source Oral, resp. rate 18, height '5\' 1"'  (1.549 m), weight  131 lb 3.2 oz (59.512 kg), SpO2 98 %. ECOG: 0 General appearance: alert and cooperative appeared in no distress. Head: Normocephalic, without obvious abnormality Neck: no adenopathy Lymph nodes: Cervical, supraclavicular, and axillary nodes normal. Heart:regular rate and rhythm, S1, S2 normal, no murmur, click, rub or gallop Lung:chest clear, no wheezing, rales,  normal symmetric air entry. Abdomin: soft, non-tender, without masses or organomegaly.  No shifting dullness or ascites noted. EXT:no erythema, induration, or nodules.  1+  edema noted.   Lab Results: Lab Results  Component Value Date   WBC 5.5 12/15/2014   HGB 12.1 12/15/2014   HCT 35.6 12/15/2014   MCV 82.6 12/15/2014   PLT 416* 12/15/2014     Chemistry      Component Value Date/Time   NA 136 12/15/2014 1339   NA 137 09/20/2014 0735   K 3.7 12/15/2014 1339   K 3.9 09/20/2014 0735   CL 105 09/20/2014 0735   CO2 29 12/15/2014 1339   CO2 26 09/20/2014 0735   BUN 6.5* 12/15/2014 1339   BUN 9 09/20/2014 0735   CREATININE 0.6 12/15/2014 1339   CREATININE 0.56 09/20/2014 0735      Component Value Date/Time   CALCIUM 7.5* 12/15/2014 1339   CALCIUM 7.7* 09/20/2014 0735   ALKPHOS 77 12/15/2014 1339   AST 22 12/15/2014 1339   ALT 16 12/15/2014 1339   BILITOT <0.20 12/15/2014 1339     Results for Anna Calderon (MRN [id number]) as of 12/22/2014 13:36  Ref. Range 11/15/2014 16:16 12/08/2014 13:30  Kappa free light chain Latest Ref Range: 0.33-1.94 mg/dL 1.53 0.73  Lambda Free Lght Chn Latest Ref Range: 0.57-2.63 mg/dL 33.00 (H) 2.29  Kappa:Lambda Ratio Latest Ref Range: 0.26-1.65  0.05 (L) 0.32     Impression and Plan:   54 year old woman with the following issues:  1. AL amyloidosis: Presented with a plasma cell disorder manifesting itself with bone marrow involvement with 19% plasma cells that are lambda light chain specific. Her free lambda light chain in the serum are elevated at 29 with At the lambda ratio depleted is 0.05. Staging CT scan did not show any evidence of clear-cut other organ involvement. 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   She is currently receiving VCD without any major complications. Her protein studies reviewed today and showed near normalization of her lambda free light chains. The plan is to continue with the  current dose and regimen and she will follow-up at  [facility] in a couple weeks for further evaluation.    2. Generalized anasarca: She is currently on diuretics with good response. Her potassium is pending from today and will repeated weekly.  3. Antiemetics: Continue Compazine as needed/prescribed    4. VZV prophylaxis: Continue acyclovir as prescribed   5. Follow-up: Will be in one week for the next treatment.  Zola Button, MD  6/30/20161:34 PM

## 2014-12-22 NOTE — Patient Instructions (Signed)

## 2014-12-22 NOTE — Telephone Encounter (Signed)
Gave patient avs report and appointments for July thru August. °

## 2014-12-29 ENCOUNTER — Other Ambulatory Visit (HOSPITAL_BASED_OUTPATIENT_CLINIC_OR_DEPARTMENT_OTHER): Payer: 59

## 2014-12-29 ENCOUNTER — Ambulatory Visit (HOSPITAL_BASED_OUTPATIENT_CLINIC_OR_DEPARTMENT_OTHER): Payer: 59

## 2014-12-29 VITALS — BP 94/67 | HR 96 | Temp 98.0°F | Resp 18

## 2014-12-29 DIAGNOSIS — E8581 Light chain (AL) amyloidosis: Secondary | ICD-10-CM

## 2014-12-29 DIAGNOSIS — E858 Other amyloidosis: Secondary | ICD-10-CM

## 2014-12-29 LAB — COMPREHENSIVE METABOLIC PANEL (CC13)
ALT: 16 U/L (ref 0–55)
ANION GAP: 10 meq/L (ref 3–11)
AST: 21 U/L (ref 5–34)
Albumin: 0.9 g/dL — ABNORMAL LOW (ref 3.5–5.0)
Alkaline Phosphatase: 78 U/L (ref 40–150)
BUN: 7.5 mg/dL (ref 7.0–26.0)
CO2: 25 mEq/L (ref 22–29)
Calcium: 7.8 mg/dL — ABNORMAL LOW (ref 8.4–10.4)
Chloride: 99 mEq/L (ref 98–109)
Creatinine: 0.6 mg/dL (ref 0.6–1.1)
EGFR: >90 mL/min/{1.73_m2} (ref >90)
Glucose: 118 mg/dl (ref 70–140)
POTASSIUM: 3.4 meq/L — AB (ref 3.5–5.1)
SODIUM: 135 meq/L — AB (ref 136–145)
Total Bilirubin: <0.2 mg/dL (ref 0.20–1.20)
Total Protein: 4.3 g/dL — ABNORMAL LOW (ref 6.4–8.3)

## 2014-12-29 LAB — CBC WITH DIFFERENTIAL/PLATELET
BASO%: 0.3 % (ref 0.0–2.0)
Basophils Absolute: 0 10*3/uL (ref 0.0–0.1)
EOS ABS: 0 10*3/uL (ref 0.0–0.5)
EOS%: 0.1 % (ref 0.0–7.0)
HCT: 35.1 % (ref 34.8–46.6)
HGB: 11.9 g/dL (ref 11.6–15.9)
LYMPH%: 4.9 % — AB (ref 14.0–49.7)
MCH: 27.6 pg (ref 25.1–34.0)
MCHC: 33.8 g/dL (ref 31.5–36.0)
MCV: 81.8 fL (ref 79.5–101.0)
MONO#: 0.1 10*3/uL (ref 0.1–0.9)
MONO%: 0.8 % (ref 0.0–14.0)
NEUT%: 93.9 % — ABNORMAL HIGH (ref 38.4–76.8)
NEUTROS ABS: 6.6 10*3/uL — AB (ref 1.5–6.5)
PLATELETS: 474 10*3/uL — AB (ref 145–400)
RBC: 4.29 10*6/uL (ref 3.70–5.45)
RDW: 19.6 % — ABNORMAL HIGH (ref 11.2–14.5)
WBC: 7 10*3/uL (ref 3.9–10.3)
lymph#: 0.3 10*3/uL — ABNORMAL LOW (ref 0.9–3.3)

## 2014-12-29 MED ORDER — ONDANSETRON HCL 8 MG PO TABS: 8.0000 mg | ORAL_TABLET | Freq: Once | ORAL | Status: DC

## 2014-12-29 MED ORDER — BORTEZOMIB CHEMO SQ INJECTION 3.5 MG (2.5MG/ML)
1.5000 mg/m2 | Freq: Once | INTRAMUSCULAR | Status: AC
  Administered 2014-12-29: 2.25 mg via SUBCUTANEOUS
  Filled 2014-12-29: qty 2.25

## 2014-12-29 NOTE — Patient Instructions (Signed)
[facility] Discharge Instructions for Patients Receiving Chemotherapy  Today you received the following chemotherapy agents:  Velcade  To help prevent nausea and vomiting after your treatment, we encourage you to take your nausea medication as prescribed.   If you develop nausea and vomiting that is not controlled by your nausea medication, call the clinic.   BELOW ARE SYMPTOMS THAT SHOULD BE REPORTED IMMEDIATELY:  *FEVER GREATER THAN 100.5 F  *CHILLS WITH OR WITHOUT FEVER  NAUSEA AND VOMITING THAT IS NOT CONTROLLED WITH YOUR NAUSEA MEDICATION  *UNUSUAL SHORTNESS OF BREATH  *UNUSUAL BRUISING OR BLEEDING  TENDERNESS IN MOUTH AND THROAT WITH OR WITHOUT PRESENCE OF ULCERS  *URINARY PROBLEMS  *BOWEL PROBLEMS  UNUSUAL RASH Items with * indicate a potential emergency and should be followed up as soon as possible.  Feel free to call the clinic you have any questions or concerns. The clinic phone number is [phone].  Please show the CHEMO ALERT CARD at check-in to the Emergency Department and triage nurse.   

## 2015-01-05 ENCOUNTER — Other Ambulatory Visit (HOSPITAL_BASED_OUTPATIENT_CLINIC_OR_DEPARTMENT_OTHER): Payer: 59

## 2015-01-05 ENCOUNTER — Ambulatory Visit (HOSPITAL_BASED_OUTPATIENT_CLINIC_OR_DEPARTMENT_OTHER): Payer: 59

## 2015-01-05 VITALS — BP 100/62 | HR 89 | Temp 97.4°F

## 2015-01-05 DIAGNOSIS — E858 Other amyloidosis: Secondary | ICD-10-CM

## 2015-01-05 DIAGNOSIS — E8581 Light chain (AL) amyloidosis: Secondary | ICD-10-CM

## 2015-01-05 LAB — COMPREHENSIVE METABOLIC PANEL (CC13)
ALBUMIN: 1 g/dL — AB (ref 3.5–5.0)
ALT: 14 U/L (ref 0–55)
ANION GAP: 8 meq/L (ref 3–11)
AST: 22 U/L (ref 5–34)
Alkaline Phosphatase: 78 U/L (ref 40–150)
BUN: 9.5 mg/dL (ref 7.0–26.0)
CALCIUM: 7.7 mg/dL — AB (ref 8.4–10.4)
CO2: 26 mEq/L (ref 22–29)
Chloride: 100 mEq/L (ref 98–109)
Creatinine: 0.6 mg/dL (ref 0.6–1.1)
Glucose: 134 mg/dl (ref 70–140)
Potassium: 3.7 mEq/L (ref 3.5–5.1)
SODIUM: 134 meq/L — AB (ref 136–145)
Total Bilirubin: <0.2 mg/dL (ref 0.20–1.20)
Total Protein: 4.4 g/dL — ABNORMAL LOW (ref 6.4–8.3)

## 2015-01-05 LAB — CBC WITH DIFFERENTIAL/PLATELET
BASO%: 0.4 % (ref 0.0–2.0)
BASOS ABS: 0 10*3/uL (ref 0.0–0.1)
EOS ABS: 0 10*3/uL (ref 0.0–0.5)
EOS%: 0 % (ref 0.0–7.0)
HCT: 36 % (ref 34.8–46.6)
HEMOGLOBIN: 12.3 g/dL (ref 11.6–15.9)
LYMPH%: 6.4 % — ABNORMAL LOW (ref 14.0–49.7)
MCH: 28.1 pg (ref 25.1–34.0)
MCHC: 34.1 g/dL (ref 31.5–36.0)
MCV: 82.4 fL (ref 79.5–101.0)
MONO#: 0.1 10*3/uL (ref 0.1–0.9)
MONO%: 1 % (ref 0.0–14.0)
NEUT#: 5.6 10*3/uL (ref 1.5–6.5)
NEUT%: 92.2 % — ABNORMAL HIGH (ref 38.4–76.8)
Platelets: 520 10*3/uL — ABNORMAL HIGH (ref 145–400)
RBC: 4.37 10*6/uL (ref 3.70–5.45)
RDW: 20.3 % — AB (ref 11.2–14.5)
WBC: 6.1 10*3/uL (ref 3.9–10.3)
lymph#: 0.4 10*3/uL — ABNORMAL LOW (ref 0.9–3.3)

## 2015-01-05 MED ORDER — BORTEZOMIB CHEMO SQ INJECTION 3.5 MG (2.5MG/ML)
1.5000 mg/m2 | Freq: Once | INTRAMUSCULAR | Status: AC
  Administered 2015-01-05: 2.25 mg via SUBCUTANEOUS
  Filled 2015-01-05: qty 2.25

## 2015-01-05 MED ORDER — ONDANSETRON HCL 8 MG PO TABS
8.0000 mg | ORAL_TABLET | Freq: Once | ORAL | Status: DC
Start: 2015-01-05 — End: 2015-01-05

## 2015-01-05 NOTE — Patient Instructions (Signed)
[facility] Discharge Instructions for Patients Receiving Chemotherapy  Today you received the following chemotherapy agents velcade   To help prevent nausea and vomiting after your treatment, we encourage you to take your nausea medication as directed  If you develop nausea and vomiting that is not controlled by your nausea medication, call the clinic.   BELOW ARE SYMPTOMS THAT SHOULD BE REPORTED IMMEDIATELY:  *FEVER GREATER THAN 100.5 F  *CHILLS WITH OR WITHOUT FEVER  NAUSEA AND VOMITING THAT IS NOT CONTROLLED WITH YOUR NAUSEA MEDICATION  *UNUSUAL SHORTNESS OF BREATH  *UNUSUAL BRUISING OR BLEEDING  TENDERNESS IN MOUTH AND THROAT WITH OR WITHOUT PRESENCE OF ULCERS  *URINARY PROBLEMS  *BOWEL PROBLEMS  UNUSUAL RASH Items with * indicate a potential emergency and should be followed up as soon as possible.  Feel free to call the clinic you have any questions or concerns. The clinic phone number is [phone].  

## 2015-01-12 ENCOUNTER — Ambulatory Visit (HOSPITAL_BASED_OUTPATIENT_CLINIC_OR_DEPARTMENT_OTHER): Payer: 59

## 2015-01-12 ENCOUNTER — Other Ambulatory Visit (HOSPITAL_BASED_OUTPATIENT_CLINIC_OR_DEPARTMENT_OTHER): Payer: 59

## 2015-01-12 VITALS — BP 99/62 | HR 84 | Temp 97.2°F | Resp 18

## 2015-01-12 DIAGNOSIS — E8581 Light chain (AL) amyloidosis: Secondary | ICD-10-CM

## 2015-01-12 DIAGNOSIS — E858 Other amyloidosis: Secondary | ICD-10-CM

## 2015-01-12 LAB — CBC WITH DIFFERENTIAL/PLATELET
BASO%: 0 % (ref 0.0–2.0)
BASOS ABS: 0 10*3/uL (ref 0.0–0.1)
EOS ABS: 0 10*3/uL (ref 0.0–0.5)
EOS%: 0 % (ref 0.0–7.0)
HEMATOCRIT: 34.6 % — AB (ref 34.8–46.6)
HGB: 12 g/dL (ref 11.6–15.9)
LYMPH%: 6.9 % — AB (ref 14.0–49.7)
MCH: 27.9 pg (ref 25.1–34.0)
MCHC: 34.7 g/dL (ref 31.5–36.0)
MCV: 80.5 fL (ref 79.5–101.0)
MONO#: 0.1 10*3/uL (ref 0.1–0.9)
MONO%: 1.2 % (ref 0.0–14.0)
NEUT#: 4.8 10*3/uL (ref 1.5–6.5)
NEUT%: 91.9 % — ABNORMAL HIGH (ref 38.4–76.8)
NRBC: 0 % (ref 0–0)
PLATELETS: 431 10*3/uL — AB (ref 145–400)
RBC: 4.3 10*6/uL (ref 3.70–5.45)
RDW: 19.7 % — AB (ref 11.2–14.5)
WBC: 5.2 10*3/uL (ref 3.9–10.3)
lymph#: 0.4 10*3/uL — ABNORMAL LOW (ref 0.9–3.3)

## 2015-01-12 LAB — COMPREHENSIVE METABOLIC PANEL (CC13)
ALBUMIN: 1 g/dL — AB (ref 3.5–5.0)
ALT: 16 U/L (ref 0–55)
AST: 24 U/L (ref 5–34)
Alkaline Phosphatase: 81 U/L (ref 40–150)
Anion Gap: 8 mEq/L (ref 3–11)
BUN: 9.4 mg/dL (ref 7.0–26.0)
CHLORIDE: 100 meq/L (ref 98–109)
CO2: 26 mEq/L (ref 22–29)
Calcium: 7.6 mg/dL — ABNORMAL LOW (ref 8.4–10.4)
Creatinine: 0.6 mg/dL (ref 0.6–1.1)
Glucose: 132 mg/dl (ref 70–140)
POTASSIUM: 3.8 meq/L (ref 3.5–5.1)
SODIUM: 134 meq/L — AB (ref 136–145)
Total Protein: 4.3 g/dL — ABNORMAL LOW (ref 6.4–8.3)

## 2015-01-12 MED ORDER — ONDANSETRON HCL 8 MG PO TABS: 8.0000 mg | ORAL_TABLET | Freq: Once | ORAL | Status: DC

## 2015-01-12 MED ORDER — BORTEZOMIB CHEMO SQ INJECTION 3.5 MG (2.5MG/ML)
1.5000 mg/m2 | Freq: Once | INTRAMUSCULAR | Status: AC
  Administered 2015-01-12: 2.25 mg via SUBCUTANEOUS
  Filled 2015-01-12: qty 2.25

## 2015-01-12 NOTE — Patient Instructions (Signed)
[facility] Discharge Instructions for Patients Receiving Chemotherapy  Today you received the following chemotherapy agents:  Velcade  To help prevent nausea and vomiting after your treatment, we encourage you to take your nausea medication as prescribed.   If you develop nausea and vomiting that is not controlled by your nausea medication, call the clinic.   BELOW ARE SYMPTOMS THAT SHOULD BE REPORTED IMMEDIATELY:  *FEVER GREATER THAN 100.5 F  *CHILLS WITH OR WITHOUT FEVER  NAUSEA AND VOMITING THAT IS NOT CONTROLLED WITH YOUR NAUSEA MEDICATION  *UNUSUAL SHORTNESS OF BREATH  *UNUSUAL BRUISING OR BLEEDING  TENDERNESS IN MOUTH AND THROAT WITH OR WITHOUT PRESENCE OF ULCERS  *URINARY PROBLEMS  *BOWEL PROBLEMS  UNUSUAL RASH Items with * indicate a potential emergency and should be followed up as soon as possible.  Feel free to call the clinic you have any questions or concerns. The clinic phone number is [phone].  Please show the CHEMO ALERT CARD at check-in to the Emergency Department and triage nurse.   

## 2015-01-19 ENCOUNTER — Other Ambulatory Visit (HOSPITAL_BASED_OUTPATIENT_CLINIC_OR_DEPARTMENT_OTHER): Payer: 59

## 2015-01-19 ENCOUNTER — Ambulatory Visit (HOSPITAL_BASED_OUTPATIENT_CLINIC_OR_DEPARTMENT_OTHER): Payer: 59

## 2015-01-19 VITALS — BP 95/61 | HR 87 | Temp 98.0°F | Resp 18

## 2015-01-19 DIAGNOSIS — E858 Other amyloidosis: Secondary | ICD-10-CM | POA: Diagnosis not present

## 2015-01-19 DIAGNOSIS — E8581 Light chain (AL) amyloidosis: Secondary | ICD-10-CM

## 2015-01-19 LAB — COMPREHENSIVE METABOLIC PANEL (CC13)
ALT: 15 U/L (ref 0–55)
AST: 27 U/L (ref 5–34)
Albumin: 1 g/dL — ABNORMAL LOW (ref 3.5–5.0)
Alkaline Phosphatase: 82 U/L (ref 40–150)
Anion Gap: 8 mEq/L (ref 3–11)
BUN: 12.4 mg/dL (ref 7.0–26.0)
CO2: 27 meq/L (ref 22–29)
CREATININE: 0.6 mg/dL (ref 0.6–1.1)
Calcium: 7.8 mg/dL — ABNORMAL LOW (ref 8.4–10.4)
Chloride: 100 mEq/L (ref 98–109)
GLUCOSE: 126 mg/dL (ref 70–140)
Potassium: 3.8 mEq/L (ref 3.5–5.1)
SODIUM: 136 meq/L (ref 136–145)
TOTAL PROTEIN: 4.3 g/dL — AB (ref 6.4–8.3)

## 2015-01-19 LAB — CBC WITH DIFFERENTIAL/PLATELET
BASO%: 0.3 % (ref 0.0–2.0)
Basophils Absolute: 0 10*3/uL (ref 0.0–0.1)
EOS ABS: 0 10*3/uL (ref 0.0–0.5)
EOS%: 0 % (ref 0.0–7.0)
HCT: 34.6 % — ABNORMAL LOW (ref 34.8–46.6)
HGB: 11.8 g/dL (ref 11.6–15.9)
LYMPH#: 0.4 10*3/uL — AB (ref 0.9–3.3)
LYMPH%: 7.4 % — ABNORMAL LOW (ref 14.0–49.7)
MCH: 28.3 pg (ref 25.1–34.0)
MCHC: 34.2 g/dL (ref 31.5–36.0)
MCV: 82.8 fL (ref 79.5–101.0)
MONO#: 0.1 10*3/uL (ref 0.1–0.9)
MONO%: 1.1 % (ref 0.0–14.0)
NEUT%: 91.2 % — ABNORMAL HIGH (ref 38.4–76.8)
NEUTROS ABS: 5.2 10*3/uL (ref 1.5–6.5)
Platelets: 425 10*3/uL — ABNORMAL HIGH (ref 145–400)
RBC: 4.18 10*6/uL (ref 3.70–5.45)
RDW: 21.2 % — ABNORMAL HIGH (ref 11.2–14.5)
WBC: 5.7 10*3/uL (ref 3.9–10.3)

## 2015-01-19 MED ORDER — BORTEZOMIB CHEMO SQ INJECTION 3.5 MG (2.5MG/ML)
1.5000 mg/m2 | Freq: Once | INTRAMUSCULAR | Status: AC
  Administered 2015-01-19: 2.25 mg via SUBCUTANEOUS
  Filled 2015-01-19: qty 2.25

## 2015-01-19 MED ORDER — ONDANSETRON HCL 8 MG PO TABS: 8.0000 mg | ORAL_TABLET | Freq: Once | ORAL | Status: DC

## 2015-01-19 NOTE — Patient Instructions (Signed)
[facility] Discharge Instructions for Patients Receiving Chemotherapy  Today you received the following chemotherapy agents Velcade  To help prevent nausea and vomiting after your treatment, we encourage you to take your nausea medication as needed   If you develop nausea and vomiting that is not controlled by your nausea medication, call the clinic.   BELOW ARE SYMPTOMS THAT SHOULD BE REPORTED IMMEDIATELY:  *FEVER GREATER THAN 100.5 F  *CHILLS WITH OR WITHOUT FEVER  NAUSEA AND VOMITING THAT IS NOT CONTROLLED WITH YOUR NAUSEA MEDICATION  *UNUSUAL SHORTNESS OF BREATH  *UNUSUAL BRUISING OR BLEEDING  TENDERNESS IN MOUTH AND THROAT WITH OR WITHOUT PRESENCE OF ULCERS  *URINARY PROBLEMS  *BOWEL PROBLEMS  UNUSUAL RASH Items with * indicate a potential emergency and should be followed up as soon as possible.  Feel free to call the clinic you have any questions or concerns. The clinic phone number is [phone].  Please show the [place] at check-in to the Emergency Department and triage nurse.

## 2015-01-20 ENCOUNTER — Other Ambulatory Visit: Payer: Self-pay | Admitting: Oncology

## 2015-01-23 LAB — SPEP & IFE WITH QIG
ALBUMIN ELP: 1.8 g/dL — AB (ref 3.8–4.8)
Alpha-1-Globulin: 0.2 g/dL (ref 0.2–0.3)
Alpha-2-Globulin: 0.9 g/dL (ref 0.5–0.9)
Beta 2: 0.2 g/dL (ref 0.2–0.5)
Beta Globulin: 0.1 g/dL — ABNORMAL LOW (ref 0.4–0.6)
Gamma Globulin: 0.1 g/dL — ABNORMAL LOW (ref 0.8–1.7)
IGG (IMMUNOGLOBIN G), SERUM: 144 mg/dL — AB (ref 690–1700)
IgA: 183 mg/dL (ref 69–380)
IgM, Serum: 399 mg/dL — ABNORMAL HIGH (ref 52–322)
TOTAL PROTEIN, SERUM ELECTROPHOR: 3.3 g/dL — AB (ref 6.1–8.1)

## 2015-01-23 LAB — KAPPA/LAMBDA LIGHT CHAINS
KAPPA FREE LGHT CHN: 0.89 mg/dL (ref 0.33–1.94)
Kappa:Lambda Ratio: 2.47 — ABNORMAL HIGH (ref 0.26–1.65)
LAMBDA FREE LGHT CHN: 0.36 mg/dL — AB (ref 0.57–2.63)

## 2015-01-26 ENCOUNTER — Other Ambulatory Visit: Payer: 59

## 2015-01-26 ENCOUNTER — Telehealth: Payer: Self-pay | Admitting: Oncology

## 2015-01-26 ENCOUNTER — Ambulatory Visit (HOSPITAL_BASED_OUTPATIENT_CLINIC_OR_DEPARTMENT_OTHER): Payer: 59 | Admitting: Oncology

## 2015-01-26 ENCOUNTER — Other Ambulatory Visit (HOSPITAL_BASED_OUTPATIENT_CLINIC_OR_DEPARTMENT_OTHER): Payer: 59

## 2015-01-26 ENCOUNTER — Ambulatory Visit (HOSPITAL_BASED_OUTPATIENT_CLINIC_OR_DEPARTMENT_OTHER): Payer: 59

## 2015-01-26 VITALS — BP 93/59

## 2015-01-26 VITALS — BP 85/51 | HR 87 | Temp 98.0°F | Resp 18 | Ht 61.0 in | Wt 130.2 lb

## 2015-01-26 DIAGNOSIS — E8581 Light chain (AL) amyloidosis: Secondary | ICD-10-CM

## 2015-01-26 DIAGNOSIS — E858 Other amyloidosis: Secondary | ICD-10-CM | POA: Diagnosis not present

## 2015-01-26 LAB — CBC WITH DIFFERENTIAL/PLATELET
BASO%: 1.4 % (ref 0.0–2.0)
BASOS ABS: 0.1 10*3/uL (ref 0.0–0.1)
EOS%: 1.1 % (ref 0.0–7.0)
Eosinophils Absolute: 0.1 10*3/uL (ref 0.0–0.5)
HEMATOCRIT: 33 % — AB (ref 34.8–46.6)
HGB: 11.2 g/dL — ABNORMAL LOW (ref 11.6–15.9)
LYMPH%: 13.6 % — ABNORMAL LOW (ref 14.0–49.7)
MCH: 28.2 pg (ref 25.1–34.0)
MCHC: 33.9 g/dL (ref 31.5–36.0)
MCV: 83.3 fL (ref 79.5–101.0)
MONO#: 0.6 10*3/uL (ref 0.1–0.9)
MONO%: 11.6 % (ref 0.0–14.0)
NEUT%: 72.3 % (ref 38.4–76.8)
NEUTROS ABS: 4.1 10*3/uL (ref 1.5–6.5)
Platelets: 406 10*3/uL — ABNORMAL HIGH (ref 145–400)
RBC: 3.97 10*6/uL (ref 3.70–5.45)
RDW: 21.1 % — AB (ref 11.2–14.5)
WBC: 5.6 10*3/uL (ref 3.9–10.3)
lymph#: 0.8 10*3/uL — ABNORMAL LOW (ref 0.9–3.3)

## 2015-01-26 LAB — COMPREHENSIVE METABOLIC PANEL (CC13)
ALBUMIN: 0.9 g/dL — AB (ref 3.5–5.0)
ALT: 17 U/L (ref 0–55)
AST: 25 U/L (ref 5–34)
Alkaline Phosphatase: 70 U/L (ref 40–150)
Anion Gap: 5 mEq/L (ref 3–11)
BUN: 7.4 mg/dL (ref 7.0–26.0)
CHLORIDE: 102 meq/L (ref 98–109)
CO2: 29 mEq/L (ref 22–29)
Calcium: 7.6 mg/dL — ABNORMAL LOW (ref 8.4–10.4)
Creatinine: 0.6 mg/dL (ref 0.6–1.1)
Glucose: 92 mg/dl (ref 70–140)
POTASSIUM: 4.1 meq/L (ref 3.5–5.1)
Sodium: 136 mEq/L (ref 136–145)
TOTAL PROTEIN: 3.9 g/dL — AB (ref 6.4–8.3)

## 2015-01-26 MED ORDER — ONDANSETRON HCL 8 MG PO TABS
ORAL_TABLET | ORAL | Status: AC
  Filled 2015-01-26: qty 1

## 2015-01-26 MED ORDER — ONDANSETRON HCL 8 MG PO TABS
8.0000 mg | ORAL_TABLET | Freq: Once | ORAL | Status: AC
  Administered 2015-01-26: 8 mg via ORAL

## 2015-01-26 MED ORDER — BORTEZOMIB CHEMO SQ INJECTION 3.5 MG (2.5MG/ML)
1.5000 mg/m2 | Freq: Once | INTRAMUSCULAR | Status: AC
  Administered 2015-01-26: 2.25 mg via SUBCUTANEOUS
  Filled 2015-01-26: qty 2.25

## 2015-01-26 NOTE — Telephone Encounter (Signed)
Pt confirmed labs/ov per 08/04 POF, gave pt avs and calendar.... KJ, sent msg to add chemo °

## 2015-01-26 NOTE — Progress Notes (Signed)
Hematology and Oncology Follow Up Visit  Anna Calderon [id number] 1960/07/27 54 y.o. 01/26/2015 12:04 PM Pcp Not In SystemNo ref. 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 Her disease is involving the bone marrow as well as the kidneys. Very limited cardiac involvement at this time.   Prior Therapy: She is status post kidney biopsy done on 08/10/2014 and showed positive staining for amyloidosis.  She is status post bone marrow biopsy on 09/20/2014 which showed 19% plasma cell infiltration suggesting plasma cell neoplasm.   Current therapy:   Velcade, Cytoxan and dexamethasone as follows:  Velcade at 1.5 mg/m subcutaneously on a weekly basis. Cyclophosphamide 300 mg/m (total dose of close to 450 mg) on a weekly basis. Dexamethasone at 20 mg by mouth (reduced from 40 mg because of her fluid retention) on a weekly basis.   Here today for week #10.   Interim History:   Anna Calderon presents today for a follow-up visit with her daughter. Since her last visit, she continues to be about the same. She has tolerated this treatment without any major complications. She does report some mild fatigue and anorexia associated with treatment. She has not reported any nausea or vomiting. She continued to have lower extremity edema. She has been been on Demadex 25 mg twice daily and potassium chloride 20 mEq by mouth twice daily which have helped her fluid retention. She does does not report any dyspnea on exertion or chest pain. She does not report any chest pain or shortness of breath.  She did not report any easy bruisability or petechiae.  She did not report any headaches, blurry vision, double vision, syncope or seizures. She does not report any fevers, chills, sweats, weight loss or abdominal discomfort. Did not report any nausea, vomiting. She does not report any urgency or  hesitancy. She did not report any hematuria. She did not report any skeletal pain, lymphadenopathy or easy bruisability. The remaining review of systems unremarkable  Medications: I have reviewed the patient's current medications.  Current Outpatient Prescriptions  Medication Sig Dispense Refill  . acyclovir (ZOVIRAX) 400 MG tablet TAKE 1 TABLET BY MOUTH TWICE DAILY 60 tablet 0  . cyclophosphamide (CYTOXAN) 50 MG tablet Take 9 tablets weekly on an empty stomach on the day of your chemotherapy. 72 tablet 3  . dexamethasone (DECADRON) 4 MG tablet Take 5 tablets every week with chemotherapy. 60 tablet 3  . midodrine (PROAMATINE) 5 MG tablet Take 5 mg by mouth 2 (two) times daily.    . potassium chloride (K-DUR) 10 MEQ tablet Take 20 mEq by mouth 2 (two) times daily.    . prochlorperazine (COMPAZINE) 10 MG tablet Take 1 tablet (10 mg total) by mouth every 6 (six) hours as needed for nausea or vomiting. 30 tablet 0  . torsemide (DEMADEX) 20 MG tablet Take 25 mg by mouth 2 (two) times daily.    . Vitamin D, Ergocalciferol, (DRISDOL) 50000 UNITS CAPS capsule Take 50,000 Units by mouth every 7 (seven) days.     No current facility-administered medications for this visit.     Allergies: No Known Allergies  Past Medical History, Surgical history, Social history, and Family History were reviewed and updated.   Physical Exam: Blood pressure 85/51, pulse 87, temperature 98 F (36.7 C), temperature source Oral, resp. rate 18, height '5\' 1"'  (1.549 m), weight 130 lb 3.2 oz (59.058 kg), SpO2 99 %.  ECOG: 0 General appearance: alert and cooperative appeared in no distress. Head: Normocephalic, without obvious abnormality Neck: no adenopathy Lymph nodes: Cervical, supraclavicular, and axillary nodes normal. Heart:regular rate and rhythm, S1, S2 normal, no murmur, click, rub or gallop Lung:chest clear, no wheezing, rales, normal symmetric air entry. Abdomin: soft, non-tender, without masses or  organomegaly.  No shifting dullness or ascites noted. EXT:no erythema, induration, or nodules.  Bilateral lower extremity edema noted.   Lab Results: Lab Results  Component Value Date   WBC 5.7 01/19/2015   HGB 11.8 01/19/2015   HCT 34.6* 01/19/2015   MCV 82.8 01/19/2015   PLT 425* 01/19/2015     Chemistry      Component Value Date/Time   NA 136 01/19/2015 1515   NA 137 09/20/2014 0735   K 3.8 01/19/2015 1515   K 3.9 09/20/2014 0735   CL 105 09/20/2014 0735   CO2 27 01/19/2015 1515   CO2 26 09/20/2014 0735   BUN 12.4 01/19/2015 1515   BUN 9 09/20/2014 0735   CREATININE 0.6 01/19/2015 1515   CREATININE 0.56 09/20/2014 0735      Component Value Date/Time   CALCIUM 7.8* 01/19/2015 1515   CALCIUM 7.7* 09/20/2014 0735   ALKPHOS 82 01/19/2015 1515   AST 27 01/19/2015 1515   ALT 15 01/19/2015 1515   BILITOT <0.20 01/19/2015 1515      Results for Anna, Calderon (MRN [id number]) as of 01/26/2015 11:42  Ref. Range 12/08/2014 13:30 01/19/2015 15:15  IgG (Immunoglobin G), Serum Latest Ref Range: 912-220-3064 mg/dL 155 (L) 144 (L)  IgA Latest Ref Range: 69-380 mg/dL 205 183  IgM, Serum Latest Ref Range: 52-322 mg/dL 365 (H) 399 (H)  Total Protein, Serum Electrophoresis Latest Ref Range: 6.1-8.1 g/dL 3.2 (L) 3.3 (L)  Kappa free light chain Latest Ref Range: 0.33-1.94 mg/dL 0.73 0.89  Lambda Free Lght Chn Latest Ref Range: 0.57-2.63 mg/dL 2.29 0.36 (L)  Kappa:Lambda Ratio Latest Ref Range: 0.26-1.65  0.32 2.47 (H)     Impression and Plan:   54 year old woman with the following issues:  1. AL amyloidosis: Presented with a plasma cell disorder manifesting itself with bone marrow involvement with 19% plasma cells that are lambda light chain specific. Her free lambda light chain in the serum are elevated at 29 with At the lambda ratio depleted is 0.05. Staging CT scan did not show any evidence of clear-cut other organ involvement. She does have nephrotic range proteinuria as the sole  presentation at this time.   She is currently receiving VCD without any major complications. Her protein studies reviewed today and continues to show normalization of her lambda free light chains. The plan is to continue with the current dose and regimen and she will follow-up at [facility] regarding her amyloidosis. I anticipate that she will need to be on this regimen for close to 6 months.   2. Generalized anasarca: She is currently on diuretics with good response. Her potassium is pending from today and will repeated weekly.  3. Antiemetics: Continue Compazine as needed/prescribed    4. VZV prophylaxis: Continue acyclovir as prescribed.   5. Nutrition: We discussed the importance of increase protein intake. Instructions on how to do that including increasing supplements with boost and ensure given to the patient today. She understand this is critical and replacing her low-level protein and albumen. I do not think IV albumen infusion would be of benefit at this time.  6. Follow-up: Will be in one week for the  next treatment.   [facility] [place], MD  8/4/201612:04 PM

## 2015-01-26 NOTE — Patient Instructions (Signed)
[facility] Discharge Instructions for Patients Receiving Chemotherapy  Today you received the following chemotherapy agents Velcade  To help prevent nausea and vomiting after your treatment, we encourage you to take your nausea medication as needed   If you develop nausea and vomiting that is not controlled by your nausea medication, call the clinic.   BELOW ARE SYMPTOMS THAT SHOULD BE REPORTED IMMEDIATELY:  *FEVER GREATER THAN 100.5 F  *CHILLS WITH OR WITHOUT FEVER  NAUSEA AND VOMITING THAT IS NOT CONTROLLED WITH YOUR NAUSEA MEDICATION  *UNUSUAL SHORTNESS OF BREATH  *UNUSUAL BRUISING OR BLEEDING  TENDERNESS IN MOUTH AND THROAT WITH OR WITHOUT PRESENCE OF ULCERS  *URINARY PROBLEMS  *BOWEL PROBLEMS  UNUSUAL RASH Items with * indicate a potential emergency and should be followed up as soon as possible.  Feel free to call the clinic you have any questions or concerns. The clinic phone number is [phone].  Please show the [place] at check-in to the Emergency Department and triage nurse.

## 2015-02-02 ENCOUNTER — Telehealth: Payer: Self-pay | Admitting: Oncology

## 2015-02-02 ENCOUNTER — Ambulatory Visit (HOSPITAL_BASED_OUTPATIENT_CLINIC_OR_DEPARTMENT_OTHER): Payer: 59

## 2015-02-02 ENCOUNTER — Other Ambulatory Visit (HOSPITAL_BASED_OUTPATIENT_CLINIC_OR_DEPARTMENT_OTHER): Payer: 59

## 2015-02-02 VITALS — BP 102/62 | HR 91 | Temp 97.6°F | Resp 18

## 2015-02-02 DIAGNOSIS — E858 Other amyloidosis: Secondary | ICD-10-CM

## 2015-02-02 DIAGNOSIS — E8581 Light chain (AL) amyloidosis: Secondary | ICD-10-CM

## 2015-02-02 LAB — CBC WITH DIFFERENTIAL/PLATELET
BASO%: 0.1 % (ref 0.0–2.0)
Basophils Absolute: 0 10*3/uL (ref 0.0–0.1)
EOS%: 0 % (ref 0.0–7.0)
Eosinophils Absolute: 0 10*3/uL (ref 0.0–0.5)
HCT: 33.7 % — ABNORMAL LOW (ref 34.8–46.6)
HGB: 11.6 g/dL (ref 11.6–15.9)
LYMPH%: 5.9 % — ABNORMAL LOW (ref 14.0–49.7)
MCH: 28.5 pg (ref 25.1–34.0)
MCHC: 34.4 g/dL (ref 31.5–36.0)
MCV: 82.8 fL (ref 79.5–101.0)
MONO#: 0 10*3/uL — AB (ref 0.1–0.9)
MONO%: 0.4 % (ref 0.0–14.0)
NEUT#: 6.3 10*3/uL (ref 1.5–6.5)
NEUT%: 93.6 % — ABNORMAL HIGH (ref 38.4–76.8)
Platelets: 387 10*3/uL (ref 145–400)
RBC: 4.07 10*6/uL (ref 3.70–5.45)
RDW: 20.5 % — ABNORMAL HIGH (ref 11.2–14.5)
WBC: 6.7 10*3/uL (ref 3.9–10.3)
lymph#: 0.4 10*3/uL — ABNORMAL LOW (ref 0.9–3.3)

## 2015-02-02 LAB — COMPREHENSIVE METABOLIC PANEL (CC13)
ALT: 15 U/L (ref 0–55)
AST: 26 U/L (ref 5–34)
Albumin: 1.1 g/dL — ABNORMAL LOW (ref 3.5–5.0)
Alkaline Phosphatase: 76 U/L (ref 40–150)
Anion Gap: 9 mEq/L (ref 3–11)
BUN: 10.5 mg/dL (ref 7.0–26.0)
CHLORIDE: 100 meq/L (ref 98–109)
CO2: 27 mEq/L (ref 22–29)
Calcium: 7.5 mg/dL — ABNORMAL LOW (ref 8.4–10.4)
Creatinine: 0.6 mg/dL (ref 0.6–1.1)
EGFR: >90 mL/min/{1.73_m2} (ref >90)
Glucose: 128 mg/dl (ref 70–140)
Potassium: 3.5 mEq/L (ref 3.5–5.1)
Sodium: 136 mEq/L (ref 136–145)
Total Bilirubin: <0.2 mg/dL (ref 0.20–1.20)
Total Protein: 4.6 g/dL — ABNORMAL LOW (ref 6.4–8.3)

## 2015-02-02 MED ORDER — ONDANSETRON HCL 8 MG PO TABS
ORAL_TABLET | ORAL | Status: AC
  Filled 2015-02-02: qty 1

## 2015-02-02 MED ORDER — ONDANSETRON HCL 8 MG PO TABS
8.0000 mg | ORAL_TABLET | Freq: Once | ORAL | Status: AC
  Administered 2015-02-02: 8 mg via ORAL

## 2015-02-02 MED ORDER — BORTEZOMIB CHEMO SQ INJECTION 3.5 MG (2.5MG/ML)
1.5000 mg/m2 | Freq: Once | INTRAMUSCULAR | Status: AC
  Administered 2015-02-02: 2.25 mg via SUBCUTANEOUS
  Filled 2015-02-02: qty 2.25

## 2015-02-02 NOTE — Telephone Encounter (Signed)
Patient husband stopped by scheduling today re tx not being scheduled. Spoke with charge nurse and patient added for inf today. Infusion appointments also added for 8/18, 8/25 and 9/1 per 8/4 pof. Patient given new schedule.

## 2015-02-02 NOTE — Telephone Encounter (Signed)
Moved 8/25 appts to 1/10 due to conflict with 3/15 appt at [facility]. Also per 8/4 pof this appt should be 8/26. Patient given new schedule. See previous note.

## 2015-02-02 NOTE — Patient Instructions (Signed)
[facility] Discharge Instructions for Patients Receiving Chemotherapy  Today you received the following chemotherapy agents VELCADE  To help prevent nausea and vomiting after your treatment, we encourage you to take your nausea medication as prescribed.   If you develop nausea and vomiting that is not controlled by your nausea medication, call the clinic.   BELOW ARE SYMPTOMS THAT SHOULD BE REPORTED IMMEDIATELY:  *FEVER GREATER THAN 100.5 F  *CHILLS WITH OR WITHOUT FEVER  NAUSEA AND VOMITING THAT IS NOT CONTROLLED WITH YOUR NAUSEA MEDICATION  *UNUSUAL SHORTNESS OF BREATH  *UNUSUAL BRUISING OR BLEEDING  TENDERNESS IN MOUTH AND THROAT WITH OR WITHOUT PRESENCE OF ULCERS  *URINARY PROBLEMS  *BOWEL PROBLEMS  UNUSUAL RASH Items with * indicate a potential emergency and should be followed up as soon as possible.  Feel free to call the clinic you have any questions or concerns. The clinic phone number is [phone].  Please show the CHEMO ALERT CARD at check-in to the Emergency Department and triage nurse.   

## 2015-02-09 ENCOUNTER — Other Ambulatory Visit (HOSPITAL_BASED_OUTPATIENT_CLINIC_OR_DEPARTMENT_OTHER): Payer: 59

## 2015-02-09 ENCOUNTER — Ambulatory Visit (HOSPITAL_BASED_OUTPATIENT_CLINIC_OR_DEPARTMENT_OTHER): Payer: 59

## 2015-02-09 VITALS — BP 98/62 | HR 93 | Temp 97.0°F | Resp 16

## 2015-02-09 DIAGNOSIS — E858 Other amyloidosis: Secondary | ICD-10-CM

## 2015-02-09 DIAGNOSIS — E8581 Light chain (AL) amyloidosis: Secondary | ICD-10-CM

## 2015-02-09 LAB — COMPREHENSIVE METABOLIC PANEL (CC13)
ALT: 16 U/L (ref 0–55)
ANION GAP: 13 meq/L — AB (ref 3–11)
AST: 26 U/L (ref 5–34)
Albumin: 1.1 g/dL — ABNORMAL LOW (ref 3.5–5.0)
Alkaline Phosphatase: 83 U/L (ref 40–150)
BUN: 13.1 mg/dL (ref 7.0–26.0)
CO2: 22 meq/L (ref 22–29)
Calcium: 7.8 mg/dL — ABNORMAL LOW (ref 8.4–10.4)
Chloride: 103 mEq/L (ref 98–109)
Creatinine: 0.7 mg/dL (ref 0.6–1.1)
EGFR: >90 mL/min/{1.73_m2} (ref >90)
GLUCOSE: 127 mg/dL (ref 70–140)
POTASSIUM: 3.8 meq/L (ref 3.5–5.1)
Sodium: 138 mEq/L (ref 136–145)
TOTAL PROTEIN: 4.4 g/dL — AB (ref 6.4–8.3)

## 2015-02-09 LAB — CBC WITH DIFFERENTIAL/PLATELET
BASO%: 0.2 % (ref 0.0–2.0)
BASOS ABS: 0 10*3/uL (ref 0.0–0.1)
EOS%: 0.1 % (ref 0.0–7.0)
Eosinophils Absolute: 0 10*3/uL (ref 0.0–0.5)
HEMATOCRIT: 34.3 % — AB (ref 34.8–46.6)
HEMOGLOBIN: 11.8 g/dL (ref 11.6–15.9)
LYMPH#: 0.4 10*3/uL — AB (ref 0.9–3.3)
LYMPH%: 6 % — ABNORMAL LOW (ref 14.0–49.7)
MCH: 28.9 pg (ref 25.1–34.0)
MCHC: 34.4 g/dL (ref 31.5–36.0)
MCV: 84.1 fL (ref 79.5–101.0)
MONO#: 0.1 10*3/uL (ref 0.1–0.9)
MONO%: 1.4 % (ref 0.0–14.0)
NEUT#: 6.1 10*3/uL (ref 1.5–6.5)
NEUT%: 92.3 % — ABNORMAL HIGH (ref 38.4–76.8)
PLATELETS: 429 10*3/uL — AB (ref 145–400)
RBC: 4.08 10*6/uL (ref 3.70–5.45)
RDW: 21.3 % — AB (ref 11.2–14.5)
WBC: 6.6 10*3/uL (ref 3.9–10.3)

## 2015-02-09 MED ORDER — ONDANSETRON HCL 8 MG PO TABS
ORAL_TABLET | ORAL | Status: AC
  Filled 2015-02-09: qty 1

## 2015-02-09 MED ORDER — ONDANSETRON HCL 8 MG PO TABS
8.0000 mg | ORAL_TABLET | Freq: Once | ORAL | Status: AC
  Administered 2015-02-09: 8 mg via ORAL

## 2015-02-09 MED ORDER — BORTEZOMIB CHEMO SQ INJECTION 3.5 MG (2.5MG/ML)
1.5000 mg/m2 | Freq: Once | INTRAMUSCULAR | Status: AC
Start: 2015-02-09 — End: 2015-02-09
  Administered 2015-02-09: 2.25 mg via SUBCUTANEOUS
  Filled 2015-02-09: qty 2.25

## 2015-02-09 NOTE — Patient Instructions (Signed)
[facility] Discharge Instructions for Patients Receiving Chemotherapy  Today you received the following chemotherapy agents:  Velcade  To help prevent nausea and vomiting after your treatment, we encourage you to take your nausea medication as prescribed.   If you develop nausea and vomiting that is not controlled by your nausea medication, call the clinic.   BELOW ARE SYMPTOMS THAT SHOULD BE REPORTED IMMEDIATELY:  *FEVER GREATER THAN 100.5 F  *CHILLS WITH OR WITHOUT FEVER  NAUSEA AND VOMITING THAT IS NOT CONTROLLED WITH YOUR NAUSEA MEDICATION  *UNUSUAL SHORTNESS OF BREATH  *UNUSUAL BRUISING OR BLEEDING  TENDERNESS IN MOUTH AND THROAT WITH OR WITHOUT PRESENCE OF ULCERS  *URINARY PROBLEMS  *BOWEL PROBLEMS  UNUSUAL RASH Items with * indicate a potential emergency and should be followed up as soon as possible.  Feel free to call the clinic you have any questions or concerns. The clinic phone number is [phone].  Please show the CHEMO ALERT CARD at check-in to the Emergency Department and triage nurse.   

## 2015-02-16 ENCOUNTER — Ambulatory Visit: Payer: Self-pay

## 2015-02-16 ENCOUNTER — Other Ambulatory Visit: Payer: Self-pay

## 2015-02-17 ENCOUNTER — Other Ambulatory Visit: Payer: Self-pay

## 2015-02-17 ENCOUNTER — Ambulatory Visit (HOSPITAL_BASED_OUTPATIENT_CLINIC_OR_DEPARTMENT_OTHER): Payer: 59

## 2015-02-17 ENCOUNTER — Other Ambulatory Visit (HOSPITAL_BASED_OUTPATIENT_CLINIC_OR_DEPARTMENT_OTHER): Payer: 59

## 2015-02-17 VITALS — BP 92/52 | HR 92 | Temp 97.6°F | Resp 18

## 2015-02-17 DIAGNOSIS — E858 Other amyloidosis: Secondary | ICD-10-CM

## 2015-02-17 DIAGNOSIS — E8581 Light chain (AL) amyloidosis: Secondary | ICD-10-CM

## 2015-02-17 LAB — COMPREHENSIVE METABOLIC PANEL (CC13)
ALT: 13 U/L (ref 0–55)
AST: 26 U/L (ref 5–34)
Albumin: 1 g/dL — ABNORMAL LOW (ref 3.5–5.0)
Alkaline Phosphatase: 90 U/L (ref 40–150)
Anion Gap: 11 mEq/L (ref 3–11)
BUN: 9.2 mg/dL (ref 7.0–26.0)
CHLORIDE: 99 meq/L (ref 98–109)
CO2: 25 mEq/L (ref 22–29)
Calcium: 7.7 mg/dL — ABNORMAL LOW (ref 8.4–10.4)
Creatinine: 0.6 mg/dL (ref 0.6–1.1)
GLUCOSE: 122 mg/dL (ref 70–140)
POTASSIUM: 3.5 meq/L (ref 3.5–5.1)
Sodium: 135 mEq/L — ABNORMAL LOW (ref 136–145)
Total Bilirubin: <0.2 mg/dL (ref 0.20–1.20)
Total Protein: 4.1 g/dL — ABNORMAL LOW (ref 6.4–8.3)

## 2015-02-17 LAB — CBC WITH DIFFERENTIAL/PLATELET
BASO%: 0.4 % (ref 0.0–2.0)
Basophils Absolute: 0 10*3/uL (ref 0.0–0.1)
EOS ABS: 0 10*3/uL (ref 0.0–0.5)
EOS%: 0.1 % (ref 0.0–7.0)
HEMATOCRIT: 33.7 % — AB (ref 34.8–46.6)
HGB: 11.5 g/dL — ABNORMAL LOW (ref 11.6–15.9)
LYMPH#: 0.3 10*3/uL — AB (ref 0.9–3.3)
LYMPH%: 4 % — AB (ref 14.0–49.7)
MCH: 28.6 pg (ref 25.1–34.0)
MCHC: 34.2 g/dL (ref 31.5–36.0)
MCV: 83.6 fL (ref 79.5–101.0)
MONO#: 0.1 10*3/uL (ref 0.1–0.9)
MONO%: 1 % (ref 0.0–14.0)
NEUT%: 94.5 % — AB (ref 38.4–76.8)
NEUTROS ABS: 6.5 10*3/uL (ref 1.5–6.5)
PLATELETS: 426 10*3/uL — AB (ref 145–400)
RBC: 4.03 10*6/uL (ref 3.70–5.45)
RDW: 21.3 % — ABNORMAL HIGH (ref 11.2–14.5)
WBC: 6.8 10*3/uL (ref 3.9–10.3)

## 2015-02-17 MED ORDER — ONDANSETRON HCL 8 MG PO TABS
ORAL_TABLET | ORAL | Status: AC
  Filled 2015-02-17: qty 1

## 2015-02-17 MED ORDER — ONDANSETRON HCL 8 MG PO TABS
8.0000 mg | ORAL_TABLET | Freq: Once | ORAL | Status: AC
  Administered 2015-02-17: 8 mg via ORAL

## 2015-02-17 MED ORDER — BORTEZOMIB CHEMO SQ INJECTION 3.5 MG (2.5MG/ML)
1.5000 mg/m2 | Freq: Once | INTRAMUSCULAR | Status: AC
  Administered 2015-02-17: 2.25 mg via SUBCUTANEOUS
  Filled 2015-02-17: qty 2.25

## 2015-02-17 NOTE — Patient Instructions (Signed)
[facility] Discharge Instructions for Patients Receiving Chemotherapy  Today you received the following chemotherapy agents:  Velcade  To help prevent nausea and vomiting after your treatment, we encourage you to take your nausea medication as prescribed.   If you develop nausea and vomiting that is not controlled by your nausea medication, call the clinic.   BELOW ARE SYMPTOMS THAT SHOULD BE REPORTED IMMEDIATELY:  *FEVER GREATER THAN 100.5 F  *CHILLS WITH OR WITHOUT FEVER  NAUSEA AND VOMITING THAT IS NOT CONTROLLED WITH YOUR NAUSEA MEDICATION  *UNUSUAL SHORTNESS OF BREATH  *UNUSUAL BRUISING OR BLEEDING  TENDERNESS IN MOUTH AND THROAT WITH OR WITHOUT PRESENCE OF ULCERS  *URINARY PROBLEMS  *BOWEL PROBLEMS  UNUSUAL RASH Items with * indicate a potential emergency and should be followed up as soon as possible.  Feel free to call the clinic you have any questions or concerns. The clinic phone number is [phone].  Please show the CHEMO ALERT CARD at check-in to the Emergency Department and triage nurse.   

## 2015-02-21 ENCOUNTER — Other Ambulatory Visit: Payer: Self-pay | Admitting: Oncology

## 2015-02-21 LAB — SPEP & IFE WITH QIG
ALPHA-1-GLOBULIN: 0.1 g/dL — AB (ref 0.2–0.3)
ALPHA-2-GLOBULIN: 0.9 g/dL (ref 0.5–0.9)
Albumin ELP: 1.6 g/dL — ABNORMAL LOW (ref 3.8–4.8)
Beta 2: 0.2 g/dL (ref 0.2–0.5)
Beta Globulin: 0.1 g/dL — ABNORMAL LOW (ref 0.4–0.6)
GAMMA GLOBULIN: 0.1 g/dL — AB (ref 0.8–1.7)
IgA: 168 mg/dL (ref 69–380)
IgG (Immunoglobin G), Serum: 131 mg/dL — ABNORMAL LOW (ref 690–1700)
IgM, Serum: 308 mg/dL (ref 52–322)
Total Protein, Serum Electrophoresis: 3 g/dL — ABNORMAL LOW (ref 6.1–8.1)

## 2015-02-21 LAB — KAPPA/LAMBDA LIGHT CHAINS
KAPPA FREE LGHT CHN: 0.28 mg/dL — AB (ref 0.33–1.94)
Kappa:Lambda Ratio: 1.04 (ref 0.26–1.65)
Lambda Free Lght Chn: 0.27 mg/dL — ABNORMAL LOW (ref 0.57–2.63)

## 2015-02-23 ENCOUNTER — Ambulatory Visit (HOSPITAL_BASED_OUTPATIENT_CLINIC_OR_DEPARTMENT_OTHER): Payer: 59 | Admitting: Oncology

## 2015-02-23 ENCOUNTER — Other Ambulatory Visit (HOSPITAL_BASED_OUTPATIENT_CLINIC_OR_DEPARTMENT_OTHER): Payer: 59

## 2015-02-23 ENCOUNTER — Ambulatory Visit (HOSPITAL_BASED_OUTPATIENT_CLINIC_OR_DEPARTMENT_OTHER): Payer: 59

## 2015-02-23 ENCOUNTER — Telehealth: Payer: Self-pay | Admitting: *Deleted

## 2015-02-23 ENCOUNTER — Telehealth: Payer: Self-pay | Admitting: Oncology

## 2015-02-23 VITALS — BP 89/54 | HR 90 | Temp 98.2°F | Resp 18 | Ht 61.0 in | Wt 131.7 lb

## 2015-02-23 DIAGNOSIS — E8581 Light chain (AL) amyloidosis: Secondary | ICD-10-CM

## 2015-02-23 DIAGNOSIS — E858 Other amyloidosis: Secondary | ICD-10-CM | POA: Diagnosis not present

## 2015-02-23 LAB — CBC WITH DIFFERENTIAL/PLATELET
BASO%: 0.6 % (ref 0.0–2.0)
BASOS ABS: 0 10*3/uL (ref 0.0–0.1)
EOS ABS: 0.1 10*3/uL (ref 0.0–0.5)
EOS%: 1.5 % (ref 0.0–7.0)
HEMATOCRIT: 31.8 % — AB (ref 34.8–46.6)
HGB: 11.2 g/dL — ABNORMAL LOW (ref 11.6–15.9)
LYMPH#: 0.5 10*3/uL — AB (ref 0.9–3.3)
LYMPH%: 9.4 % — ABNORMAL LOW (ref 14.0–49.7)
MCH: 29.7 pg (ref 25.1–34.0)
MCHC: 35.2 g/dL (ref 31.5–36.0)
MCV: 84.4 fL (ref 79.5–101.0)
MONO#: 0.6 10*3/uL (ref 0.1–0.9)
MONO%: 11 % (ref 0.0–14.0)
NEUT#: 4.1 10*3/uL (ref 1.5–6.5)
NEUT%: 77.5 % — AB (ref 38.4–76.8)
PLATELETS: 285 10*3/uL (ref 145–400)
RBC: 3.77 10*6/uL (ref 3.70–5.45)
RDW: 19.6 % — ABNORMAL HIGH (ref 11.2–14.5)
WBC: 5.3 10*3/uL (ref 3.9–10.3)

## 2015-02-23 LAB — COMPREHENSIVE METABOLIC PANEL (CC13)
ALT: 12 U/L (ref 0–55)
AST: 23 U/L (ref 5–34)
Albumin: 1 g/dL — ABNORMAL LOW (ref 3.5–5.0)
Alkaline Phosphatase: 75 U/L (ref 40–150)
Anion Gap: 8 mEq/L (ref 3–11)
BUN: 9.5 mg/dL (ref 7.0–26.0)
CHLORIDE: 100 meq/L (ref 98–109)
CO2: 28 meq/L (ref 22–29)
Calcium: 7.9 mg/dL — ABNORMAL LOW (ref 8.4–10.4)
Creatinine: 0.6 mg/dL (ref 0.6–1.1)
GLUCOSE: 94 mg/dL (ref 70–140)
POTASSIUM: 3.8 meq/L (ref 3.5–5.1)
SODIUM: 136 meq/L (ref 136–145)
Total Bilirubin: <0.2 mg/dL (ref 0.20–1.20)
Total Protein: 4.4 g/dL — ABNORMAL LOW (ref 6.4–8.3)

## 2015-02-23 MED ORDER — BORTEZOMIB CHEMO SQ INJECTION 3.5 MG (2.5MG/ML)
1.5000 mg/m2 | Freq: Once | INTRAMUSCULAR | Status: AC
  Administered 2015-02-23: 2.25 mg via SUBCUTANEOUS
  Filled 2015-02-23: qty 2.25

## 2015-02-23 MED ORDER — ONDANSETRON HCL 8 MG PO TABS
8.0000 mg | ORAL_TABLET | Freq: Once | ORAL | Status: AC
  Administered 2015-02-23: 8 mg via ORAL

## 2015-02-23 MED ORDER — ONDANSETRON HCL 8 MG PO TABS
ORAL_TABLET | ORAL | Status: AC
  Filled 2015-02-23: qty 1

## 2015-02-23 NOTE — Patient Instructions (Signed)
[facility] Discharge Instructions for Patients Receiving Chemotherapy  Today you received the following chemotherapy agents:  Velcade  To help prevent nausea and vomiting after your treatment, we encourage you to take your nausea medication as ordered per MD.   If you develop nausea and vomiting that is not controlled by your nausea medication, call the clinic.   BELOW ARE SYMPTOMS THAT SHOULD BE REPORTED IMMEDIATELY:  *FEVER GREATER THAN 100.5 F  *CHILLS WITH OR WITHOUT FEVER  NAUSEA AND VOMITING THAT IS NOT CONTROLLED WITH YOUR NAUSEA MEDICATION  *UNUSUAL SHORTNESS OF BREATH  *UNUSUAL BRUISING OR BLEEDING  TENDERNESS IN MOUTH AND THROAT WITH OR WITHOUT PRESENCE OF ULCERS  *URINARY PROBLEMS  *BOWEL PROBLEMS  UNUSUAL RASH Items with * indicate a potential emergency and should be followed up as soon as possible.  Feel free to call the clinic you have any questions or concerns. The clinic phone number is [phone].  Please show the CHEMO ALERT CARD at check-in to the Emergency Department and triage nurse.   

## 2015-02-23 NOTE — Telephone Encounter (Signed)
per pof to sch pt appt-sent MW emai to sch trmt-pt stated will review MY CHART pt does no sok fluent ENGLISH-no need to call PLEASE

## 2015-02-23 NOTE — Progress Notes (Signed)
Hematology and Oncology Follow Up Visit  Anna Calderon [id number] 1960-08-31 54 y.o. 02/23/2015 10:36 AM Pcp Not In SystemNo ref. 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 Her disease is involving the bone marrow as well as the kidneys. She has potentially cardiac and nerve involvement.   Prior Therapy: She is status post kidney biopsy done on 08/10/2014 and showed positive staining for amyloidosis.  She is status post bone marrow biopsy on 09/20/2014 which showed 19% plasma cell infiltration suggesting plasma cell neoplasm.   Current therapy:   Velcade, Cytoxan and dexamethasone as follows:  Velcade at 1.5 mg/m subcutaneously on a weekly basis. Cyclophosphamide 300 mg/m (total dose of close to 450 mg) on a weekly basis. Dexamethasone at 20 mg by mouth (reduced from 40 mg because of her fluid retention) on a weekly basis.   Therapy started on 11/24/2014.  Interim History:   Anna Calderon presents today for a follow-up visit with her daughter. Since her last visit, she continues to be about the same. She has reported more fluid retention since the last visit. She has lower extremity edema and bulging flanks. She has tolerated this treatment without any major complications. She does report some mild fatigue and anorexia associated with treatment. She has not reported any nausea or vomiting. She does does not report any dyspnea on exertion or chest pain. She does have a very mild diarrhea that is controlled by Imodium. She is followed by cardiology and nephrology at [facility] regarding her amyloid condition.  She does not report any chest pain or shortness of breath.  She did not report any easy bruisability or petechiae.  She did not report any headaches, blurry vision, double vision, syncope or seizures. She does not report any fevers, chills, sweats, weight  loss or abdominal discomfort. Did not report any nausea, vomiting. She does not report any urgency or hesitancy. She did not report any hematuria. She did not report any skeletal pain, lymphadenopathy or easy bruisability. The remaining review of systems unremarkable  Medications: I have reviewed the patient's current medications.  Current Outpatient Prescriptions  Medication Sig Dispense Refill  . acyclovir (ZOVIRAX) 400 MG tablet TAKE 1 TABLET BY MOUTH TWICE DAILY 60 tablet 0  . cyclophosphamide (CYTOXAN) 50 MG tablet Take 9 tablets weekly on an empty stomach on the day of your chemotherapy. 72 tablet 3  . dexamethasone (DECADRON) 4 MG tablet Take 5 tablets every week with chemotherapy. 60 tablet 3  . midodrine (PROAMATINE) 5 MG tablet Take 5 mg by mouth 2 (two) times daily.    . potassium chloride (K-DUR) 10 MEQ tablet Take 20 mEq by mouth 2 (two) times daily.    . prochlorperazine (COMPAZINE) 10 MG tablet Take 1 tablet (10 mg total) by mouth every 6 (six) hours as needed for nausea or vomiting. 30 tablet 0  . torsemide (DEMADEX) 20 MG tablet Take 25 mg by mouth 2 (two) times daily.    . Vitamin D, Ergocalciferol, (DRISDOL) 50000 UNITS CAPS capsule Take 50,000 Units by mouth every 7 (seven) days.     No current facility-administered medications for this visit.     Allergies: No Known Allergies  Past Medical History, Surgical history, Social history, and Family History were reviewed and updated.   Physical Exam: Blood pressure 89/54, pulse 90, temperature 98.2 F (36.8 C), temperature source Oral, resp. rate 18, height 5'  1" (1.549 m), weight 131 lb 11.2 oz (59.739 kg), SpO2 97 %. ECOG: 1 General appearance: alert and cooperative appeared in no distress. Chronically ill-appearing. Head: Normocephalic, without obvious abnormality Neck: no adenopathy Lymph nodes: Cervical, supraclavicular, and axillary nodes normal. Heart:regular rate and rhythm, S1, S2 normal, no murmur, click, rub or  gallop Lung:chest clear, no wheezing, rales, normal symmetric air entry. Abdomin: soft, non-tender, without masses or organomegaly.  Slight ascites noted. EXT: Bilateral lower extremity edema noted.   Lab Results: Lab Results  Component Value Date   WBC 5.3 02/23/2015   HGB 11.2* 02/23/2015   HCT 31.8* 02/23/2015   MCV 84.4 02/23/2015   PLT 285 02/23/2015     Chemistry      Component Value Date/Time   NA 135* 02/17/2015 1309   NA 137 09/20/2014 0735   K 3.5 02/17/2015 1309   K 3.9 09/20/2014 0735   CL 105 09/20/2014 0735   CO2 25 02/17/2015 1309   CO2 26 09/20/2014 0735   BUN 9.2 02/17/2015 1309   BUN 9 09/20/2014 0735   CREATININE 0.6 02/17/2015 1309   CREATININE 0.56 09/20/2014 0735      Component Value Date/Time   CALCIUM 7.7* 02/17/2015 1309   CALCIUM 7.7* 09/20/2014 0735   ALKPHOS 90 02/17/2015 1309   AST 26 02/17/2015 1309   ALT 13 02/17/2015 1309   BILITOT <0.20 02/17/2015 1309      Results for Anna Calderon, Anna Calderon (MRN [id number]) as of 02/23/2015 10:18  Ref. Range 02/17/2015 13:09  Kappa free light chain Latest Ref Range: 0.33-1.94 mg/dL 0.28 (L)  Lambda Free Lght Chn Latest Ref Range: 0.57-2.63 mg/dL 0.27 (L)  Kappa:Lambda Ratio Latest Ref Range: 0.26-1.65  1.04      Impression and Plan:   54 year old woman with the following issues:  1. AL amyloidosis: Presented with a plasma cell disorder manifesting itself with bone marrow involvement with 19% plasma cells that are lambda light chain specific. Her free lambda light chain in the serum are elevated at 29 with At the lambda ratio depleted is 0.05. Staging CT scan did not show any evidence of clear-cut other organ involvement. 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   She is currently receiving VCD without any major complications. Her protein studies  from 02/17/2015 reviewed today reviewed today and continues to show normalization of her lambda free light chains. The plan  is to continue with the current dose and regimen and she will follow-up at [facility] regarding her amyloidosis. I anticipate that she will need to be on this regimen for 8-12 months depending on her remission status.  She will continue to follow-up with cardiology as well as nephrology regarding her amyloid condition.    2. Generalized anasarca: She is currently on diuretics with good response. Her potassium is pending from today and will repeated weekly.  3. Antiemetics: Continue Compazine as needed/prescribed    4. VZV prophylaxis: Continue acyclovir as prescribed.   5. Nutrition: does continue to be emphasized and visited every follow-up visit. Her appetite and weight are reasonably maintained.   6. Follow-up: Will be in one week for the next treatment.   Zola Button, MD  9/1/201610:36 AM

## 2015-02-23 NOTE — Telephone Encounter (Signed)
Per staff message and POF I have scheduled appts. Advised scheduler of appts. JMW  

## 2015-03-01 ENCOUNTER — Other Ambulatory Visit: Payer: Self-pay | Admitting: *Deleted

## 2015-03-01 MED ORDER — CYCLOPHOSPHAMIDE 50 MG PO TABS: ORAL_TABLET | ORAL | Status: DC

## 2015-03-02 ENCOUNTER — Other Ambulatory Visit (HOSPITAL_BASED_OUTPATIENT_CLINIC_OR_DEPARTMENT_OTHER): Payer: 59

## 2015-03-02 ENCOUNTER — Ambulatory Visit (HOSPITAL_BASED_OUTPATIENT_CLINIC_OR_DEPARTMENT_OTHER): Payer: 59

## 2015-03-02 VITALS — BP 98/58 | HR 82 | Temp 97.4°F | Resp 16

## 2015-03-02 DIAGNOSIS — E858 Other amyloidosis: Secondary | ICD-10-CM

## 2015-03-02 DIAGNOSIS — E8581 Light chain (AL) amyloidosis: Secondary | ICD-10-CM

## 2015-03-02 LAB — COMPREHENSIVE METABOLIC PANEL (CC13)
ALT: 15 U/L (ref 0–55)
ANION GAP: 6 meq/L (ref 3–11)
AST: 24 U/L (ref 5–34)
Albumin: 0.8 g/dL — ABNORMAL LOW (ref 3.5–5.0)
Alkaline Phosphatase: 78 U/L (ref 40–150)
BUN: 6.5 mg/dL — ABNORMAL LOW (ref 7.0–26.0)
CHLORIDE: 102 meq/L (ref 98–109)
CO2: 30 meq/L — AB (ref 22–29)
Calcium: 7.6 mg/dL — ABNORMAL LOW (ref 8.4–10.4)
Creatinine: 0.6 mg/dL (ref 0.6–1.1)
Glucose: 89 mg/dl (ref 70–140)
POTASSIUM: 3.8 meq/L (ref 3.5–5.1)
Sodium: 138 mEq/L (ref 136–145)
Total Bilirubin: 0.28 mg/dL (ref 0.20–1.20)
Total Protein: 3.9 g/dL — ABNORMAL LOW (ref 6.4–8.3)

## 2015-03-02 LAB — CBC WITH DIFFERENTIAL/PLATELET
BASO%: 0.2 % (ref 0.0–2.0)
BASOS ABS: 0 10*3/uL (ref 0.0–0.1)
EOS ABS: 0.1 10*3/uL (ref 0.0–0.5)
EOS%: 1.1 % (ref 0.0–7.0)
HEMATOCRIT: 32.4 % — AB (ref 34.8–46.6)
HEMOGLOBIN: 10.9 g/dL — AB (ref 11.6–15.9)
LYMPH#: 0.6 10*3/uL — AB (ref 0.9–3.3)
LYMPH%: 9.3 % — ABNORMAL LOW (ref 14.0–49.7)
MCH: 28.4 pg (ref 25.1–34.0)
MCHC: 33.6 g/dL (ref 31.5–36.0)
MCV: 84.4 fL (ref 79.5–101.0)
MONO#: 0.7 10*3/uL (ref 0.1–0.9)
MONO%: 10.9 % (ref 0.0–14.0)
NEUT%: 78.5 % — ABNORMAL HIGH (ref 38.4–76.8)
NEUTROS ABS: 4.9 10*3/uL (ref 1.5–6.5)
Platelets: 406 10*3/uL — ABNORMAL HIGH (ref 145–400)
RBC: 3.84 10*6/uL (ref 3.70–5.45)
RDW: 19.4 % — AB (ref 11.2–14.5)
WBC: 6.2 10*3/uL (ref 3.9–10.3)

## 2015-03-02 MED ORDER — BORTEZOMIB CHEMO SQ INJECTION 3.5 MG (2.5MG/ML)
1.5000 mg/m2 | Freq: Once | INTRAMUSCULAR | Status: AC
  Administered 2015-03-02: 2.25 mg via SUBCUTANEOUS
  Filled 2015-03-02: qty 2.25

## 2015-03-02 MED ORDER — ONDANSETRON HCL 8 MG PO TABS
8.0000 mg | ORAL_TABLET | Freq: Once | ORAL | Status: AC
  Administered 2015-03-02: 8 mg via ORAL

## 2015-03-02 MED ORDER — ONDANSETRON HCL 8 MG PO TABS
ORAL_TABLET | ORAL | Status: AC
  Filled 2015-03-02: qty 1

## 2015-03-02 NOTE — Patient Instructions (Signed)
[facility] Discharge Instructions for Patients Receiving Chemotherapy  Today you received the following chemotherapy agents: Velcade.  To help prevent nausea and vomiting after your treatment, we encourage you to take your nausea medication: Compazine 10 mg every 6 hours as needed.   If you develop nausea and vomiting that is not controlled by your nausea medication, call the clinic.   BELOW ARE SYMPTOMS THAT SHOULD BE REPORTED IMMEDIATELY:  *FEVER GREATER THAN 100.5 F  *CHILLS WITH OR WITHOUT FEVER  NAUSEA AND VOMITING THAT IS NOT CONTROLLED WITH YOUR NAUSEA MEDICATION  *UNUSUAL SHORTNESS OF BREATH  *UNUSUAL BRUISING OR BLEEDING  TENDERNESS IN MOUTH AND THROAT WITH OR WITHOUT PRESENCE OF ULCERS  *URINARY PROBLEMS  *BOWEL PROBLEMS  UNUSUAL RASH Items with * indicate a potential emergency and should be followed up as soon as possible.  Feel free to call the clinic you have any questions or concerns. The clinic phone number is [phone].  Please show the CHEMO ALERT CARD at check-in to the Emergency Department and triage nurse.   

## 2015-03-07 ENCOUNTER — Encounter (HOSPITAL_COMMUNITY): Payer: Self-pay | Admitting: *Deleted

## 2015-03-07 ENCOUNTER — Emergency Department (HOSPITAL_COMMUNITY): Payer: 59

## 2015-03-07 ENCOUNTER — Inpatient Hospital Stay (HOSPITAL_COMMUNITY)
Admission: EM | Admit: 2015-03-07 | Discharge: 2015-03-13 | DRG: 871 | Disposition: A | Discharge status: Home / Self Care | Payer: 59 | Attending: Internal Medicine | Admitting: Internal Medicine

## 2015-03-07 DIAGNOSIS — D638 Anemia in other chronic diseases classified elsewhere: Secondary | ICD-10-CM | POA: Diagnosis present

## 2015-03-07 DIAGNOSIS — R601 Generalized edema: Secondary | ICD-10-CM | POA: Diagnosis present

## 2015-03-07 DIAGNOSIS — R579 Shock, unspecified: Secondary | ICD-10-CM

## 2015-03-07 DIAGNOSIS — E440 Moderate protein-calorie malnutrition: Secondary | ICD-10-CM | POA: Diagnosis present

## 2015-03-07 DIAGNOSIS — E859 Amyloidosis, unspecified: Secondary | ICD-10-CM | POA: Diagnosis present

## 2015-03-07 DIAGNOSIS — A419 Sepsis, unspecified organism: Secondary | ICD-10-CM | POA: Diagnosis not present

## 2015-03-07 DIAGNOSIS — E871 Hypo-osmolality and hyponatremia: Secondary | ICD-10-CM | POA: Diagnosis present

## 2015-03-07 DIAGNOSIS — R6521 Severe sepsis with septic shock: Secondary | ICD-10-CM | POA: Diagnosis present

## 2015-03-07 DIAGNOSIS — J90 Pleural effusion, not elsewhere classified: Secondary | ICD-10-CM | POA: Diagnosis present

## 2015-03-07 DIAGNOSIS — Z9221 Personal history of antineoplastic chemotherapy: Secondary | ICD-10-CM

## 2015-03-07 DIAGNOSIS — E8809 Other disorders of plasma-protein metabolism, not elsewhere classified: Secondary | ICD-10-CM | POA: Diagnosis present

## 2015-03-07 DIAGNOSIS — J811 Chronic pulmonary edema: Secondary | ICD-10-CM

## 2015-03-07 DIAGNOSIS — E8581 Light chain (AL) amyloidosis: Secondary | ICD-10-CM | POA: Diagnosis present

## 2015-03-07 DIAGNOSIS — R509 Fever, unspecified: Secondary | ICD-10-CM | POA: Diagnosis not present

## 2015-03-07 DIAGNOSIS — IMO0001 Reserved for inherently not codable concepts without codable children: Secondary | ICD-10-CM | POA: Insufficient documentation

## 2015-03-07 DIAGNOSIS — E876 Hypokalemia: Secondary | ICD-10-CM | POA: Diagnosis present

## 2015-03-07 DIAGNOSIS — D630 Anemia in neoplastic disease: Secondary | ICD-10-CM | POA: Diagnosis present

## 2015-03-07 HISTORY — DX: Malignant (primary) neoplasm, unspecified: C80.1

## 2015-03-07 LAB — COMPREHENSIVE METABOLIC PANEL
ALBUMIN: 1.2 g/dL — AB (ref 3.5–5.0)
ALT: 13 U/L — ABNORMAL LOW (ref 14–54)
ANION GAP: 5 (ref 5–15)
AST: 23 U/L (ref 15–41)
Alkaline Phosphatase: 66 U/L (ref 38–126)
BILIRUBIN TOTAL: 0.8 mg/dL (ref 0.3–1.2)
BUN: 10 mg/dL (ref 6–20)
CO2: 28 mmol/L (ref 22–32)
Calcium: 7.3 mg/dL — ABNORMAL LOW (ref 8.9–10.3)
Chloride: 100 mmol/L — ABNORMAL LOW (ref 101–111)
Creatinine, Ser: 0.6 mg/dL (ref 0.44–1.00)
GFR calc non Af Amer: >60 mL/min (ref >60)
GLUCOSE: 101 mg/dL — AB (ref 65–99)
POTASSIUM: 3.3 mmol/L — AB (ref 3.5–5.1)
SODIUM: 133 mmol/L — AB (ref 135–145)
TOTAL PROTEIN: 3.3 g/dL — AB (ref 6.5–8.1)

## 2015-03-07 LAB — URINALYSIS, ROUTINE W REFLEX MICROSCOPIC
Bilirubin Urine: NEGATIVE
Glucose, UA: NEGATIVE mg/dL
Ketones, ur: NEGATIVE mg/dL
LEUKOCYTES UA: NEGATIVE
NITRITE: NEGATIVE
PH: 5.5 (ref 5.0–8.0)
Protein, ur: 100 mg/dL — AB
SPECIFIC GRAVITY, URINE: 1.007 (ref 1.005–1.030)
Urobilinogen, UA: 0.2 mg/dL (ref 0.0–1.0)

## 2015-03-07 LAB — CBC WITH DIFFERENTIAL/PLATELET
BASOS ABS: 0 10*3/uL (ref 0.0–0.1)
Basophils Relative: 0 % (ref 0–1)
Eosinophils Absolute: 0 10*3/uL (ref 0.0–0.7)
Eosinophils Relative: 0 % (ref 0–5)
HCT: 28.7 % — ABNORMAL LOW (ref 36.0–46.0)
HEMOGLOBIN: 9.9 g/dL — AB (ref 12.0–15.0)
LYMPHS ABS: 0.4 10*3/uL — AB (ref 0.7–4.0)
LYMPHS PCT: 3 % — AB (ref 12–46)
MCH: 28.9 pg (ref 26.0–34.0)
MCHC: 34.5 g/dL (ref 30.0–36.0)
MCV: 83.7 fL (ref 78.0–100.0)
Monocytes Absolute: 0.8 10*3/uL (ref 0.1–1.0)
Monocytes Relative: 6 % (ref 3–12)
NEUTROS ABS: 11.8 10*3/uL — AB (ref 1.7–7.7)
NEUTROS PCT: 91 % — AB (ref 43–77)
Platelets: 385 10*3/uL (ref 150–400)
RBC: 3.43 MIL/uL — AB (ref 3.87–5.11)
RDW: 19 % — ABNORMAL HIGH (ref 11.5–15.5)
WBC: 13 10*3/uL — AB (ref 4.0–10.5)

## 2015-03-07 LAB — URINE MICROSCOPIC-ADD ON

## 2015-03-07 LAB — I-STAT CG4 LACTIC ACID, ED: LACTIC ACID, VENOUS: 0.97 mmol/L (ref 0.5–2.0)

## 2015-03-07 MED ORDER — ACETAMINOPHEN 500 MG PO TABS
1000.0000 mg | ORAL_TABLET | Freq: Once | ORAL | Status: AC
  Administered 2015-03-07: 1000 mg via ORAL
  Filled 2015-03-07: qty 2

## 2015-03-07 MED ORDER — SODIUM CHLORIDE 0.9 % IV BOLUS (SEPSIS)
1000.0000 mL | Freq: Once | INTRAVENOUS | Status: AC
  Administered 2015-03-07: 1000 mL via INTRAVENOUS

## 2015-03-07 MED ORDER — ACETAMINOPHEN 325 MG PO TABS
650.0000 mg | ORAL_TABLET | Freq: Once | ORAL | Status: DC | PRN
  Filled 2015-03-07: qty 2

## 2015-03-07 MED ORDER — PIPERACILLIN-TAZOBACTAM 3.375 G IVPB 30 MIN
3.3750 g | Freq: Three times a day (TID) | INTRAVENOUS | Status: DC
  Administered 2015-03-07: 3.375 g via INTRAVENOUS
  Filled 2015-03-07 (×2): qty 50

## 2015-03-07 NOTE — ED Notes (Signed)
Pt reports that she is a Cancer pt and states last had Chemo in Thurs; pt states that she began to have a fever around 1pm this afternoon; pt reports mild cough and congestion but denies any other sx at present

## 2015-03-07 NOTE — ED Provider Notes (Signed)
CSN: [id number]     Arrival date & time 03/07/15  1937 History   First MD Initiated Contact with Patient 03/07/15 2008     Chief Complaint  Patient presents with  . Fever     (Consider location/radiation/quality/duration/timing/severity/associated sxs/prior Treatment) HPI Comments: The patient is a 54 year old female, she has amyloidosis with involvement of multiple organ systems including her kidneys which has given her some proteinuria and edema. She has been on chemotherapy, last dose was on Thursday, she presents today with a fever of 101.7 with a feeling of occasional mild cough and congestion but no other symptoms including diarrhea rashes sore throat and stiff neck abdominal pain or dysuria. She has been taking her medications and able to eat and drink without difficulty. Symptoms are persistent throughout the day, nothing makes this better or worse, no medications given prior to arrival.  Patient is a 54 y.o. female presenting with fever. The history is provided by the patient.  Fever   Past Medical History  Diagnosis Date  . Proteinuria   . Edema   . Cancer    Past Surgical History  Procedure Laterality Date  . Tubal ligation     No family history on file. Social History  Substance Use Topics  . Smoking status: Never Smoker   . Smokeless tobacco: None  . Alcohol Use: No   OB History    Gravida Para Term Preterm AB TAB SAB Ectopic Multiple Living   0 0 0 0 0 0 0 0       Review of Systems  Constitutional: Positive for fever.  All other systems reviewed and are negative.     Allergies  Review of patient's allergies indicates no known allergies.  Home Medications   Prior to Admission medications   Medication Sig Start Date End Date Taking? Authorizing Provider  acyclovir (ZOVIRAX) 400 MG tablet TAKE 1 TABLET BY MOUTH TWICE DAILY 02/21/15  Yes Wyatt Portela, MD  aspirin 325 MG tablet Take 325 mg by mouth once.   Yes Historical Provider, MD  Cholecalciferol  (VITAMIN D3) 1000 UNITS CAPS Take 1 tablet by mouth daily.   Yes Historical Provider, MD  cyclophosphamide (CYTOXAN) 50 MG tablet Take 9 tablets weekly on an empty stomach on the day of your chemotherapy. 03/01/15  Yes Wyatt Portela, MD  dexamethasone (DECADRON) 4 MG tablet Take 5 tablets every week with chemotherapy. 11/15/14  Yes Wyatt Portela, MD  midodrine (PROAMATINE) 5 MG tablet Take 5 mg by mouth 2 (two) times daily.   Yes Historical Provider, MD  potassium chloride (K-DUR) 10 MEQ tablet Take 20 mEq by mouth 2 (two) times daily. 10/25/14 10/25/15 Yes Historical Provider, MD  prochlorperazine (COMPAZINE) 10 MG tablet Take 1 tablet (10 mg total) by mouth every 6 (six) hours as needed for nausea or vomiting. 11/15/14  Yes Wyatt Portela, MD  torsemide (DEMADEX) 20 MG tablet Take 25 mg by mouth 2 (two) times daily. 11/04/14 11/04/15 Yes Historical Provider, MD   BP 83/45 mmHg  Pulse 93  Temp(Src) 99.2 F (37.3 C) (Oral)  Resp 23  Ht 5\' 1"  (1.549 m)  Wt 127 lb (57.607 kg)  BMI 24.01 kg/m2  SpO2 94% Physical Exam  Constitutional: She appears well-developed and well-nourished. No distress.  HENT:  Head: Normocephalic and atraumatic.  Mouth/Throat: Oropharynx is clear and moist. No oropharyngeal exudate.  Eyes: Conjunctivae and EOM are normal. Pupils are equal, round, and reactive to light. Right eye exhibits no discharge. Left  eye exhibits no discharge. No scleral icterus.  Neck: Normal range of motion. Neck supple. No JVD present. No thyromegaly present.  Cardiovascular: Regular rhythm, normal heart sounds and intact distal pulses.  Exam reveals no gallop and no friction rub.   No murmur heard. Tachycardic to 105, no murmurs, strong pulses at the radial artery  Pulmonary/Chest: Effort normal and breath sounds normal. No respiratory distress. She has no wheezes. She has no rales.  Abdominal: Soft. Bowel sounds are normal. She exhibits no distension and no mass. There is no tenderness.   Musculoskeletal: Normal range of motion. She exhibits edema ( Bilateral lower extremity pitting edema below the knees, symmetrical). She exhibits no tenderness.  Lymphadenopathy:    She has no cervical adenopathy.  Neurological: She is alert. Coordination normal.  Skin: Skin is warm and dry. No rash noted. No erythema.  Psychiatric: She has a normal mood and affect. Her behavior is normal.  Nursing note and vitals reviewed.   ED Course  Procedures (including critical care time) Labs Review Labs Reviewed  COMPREHENSIVE METABOLIC PANEL - Abnormal; Notable for the following:    Sodium 133 (*)    Potassium 3.3 (*)    Chloride 100 (*)    Glucose, Bld 101 (*)    Calcium 7.3 (*)    Total Protein 3.3 (*)    Albumin 1.2 (*)    ALT 13 (*)    All other components within normal limits  URINALYSIS, ROUTINE W REFLEX MICROSCOPIC (NOT AT [facility]) - Abnormal; Notable for the following:    Hgb urine dipstick TRACE (*)    Protein, ur 100 (*)    All other components within normal limits  CBC WITH DIFFERENTIAL/PLATELET - Abnormal; Notable for the following:    WBC 13.0 (*)    RBC 3.43 (*)    Hemoglobin 9.9 (*)    HCT 28.7 (*)    RDW 19.0 (*)    Neutrophils Relative % 91 (*)    Neutro Abs 11.8 (*)    Lymphocytes Relative 3 (*)    Lymphs Abs 0.4 (*)    All other components within normal limits  URINE MICROSCOPIC-ADD ON - Abnormal; Notable for the following:    Bacteria, UA FEW (*)    Casts GRANULAR CAST (*)    All other components within normal limits  CULTURE, BLOOD (ROUTINE X 2)  CULTURE, BLOOD (ROUTINE X 2)  URINE CULTURE  I-STAT CG4 LACTIC ACID, ED  I-STAT CG4 LACTIC ACID, ED    Imaging Review Dg Chest 2 View  03/07/2015   CLINICAL DATA:  Fever since 1300 hour today, 8 hours prior.  EXAM: CHEST  2 VIEW  COMPARISON:  Chest CT 09/13/2014  FINDINGS: Small bilateral pleural effusions with adjacent atelectasis. No confluent airspace disease to suggest pneumonia. The heart size and  mediastinal contours are normal. No pulmonary edema or pneumothorax. No acute osseous abnormalities are seen.  IMPRESSION: Small bilateral pleural effusions with adjacent atelectasis at the lung bases. No consolidation to suggest pneumonia.   Electronically Signed   By: Jeb Levering M.D.   On: 03/07/2015 21:21   I have personally reviewed and evaluated these images and lab results as part of my medical decision-making.   MDM   Final diagnoses:  Shock  Sepsis, due to unspecified organism    The patient has no focus of infection on exam however she does have a fever. Tylenol given, tachycardic, check labs, chest x-ray, septic workup. The patient is in agreement with the  plan.  Fever has defervesced with medications, unfortunately she has become more hypotensive, fluid resuscitation was initiated with IV fluid boluses and antibiotics to treat for potential neutropenic fever.  After lab work returned showing no neutropenia, Zosyn had been given. Cultures obtained, patient discussed with hospitalist who will admit.  CRITICAL CARE Performed by: Johnna Acosta Total critical care time: 35 Critical care time was exclusive of separately billable procedures and treating other patients. Critical care was necessary to treat or prevent imminent or life-threatening deterioration. Critical care was time spent personally by me on the following activities: development of treatment plan with patient and/or surrogate as well as nursing, discussions with consultants, evaluation of patient's response to treatment, examination of patient, obtaining history from patient or surrogate, ordering and performing treatments and interventions, ordering and review of laboratory studies, ordering and review of radiographic studies, pulse oximetry and re-evaluation of patient's condition.  Meds given in ED:  Medications  acetaminophen (TYLENOL) tablet 650 mg (not administered)  piperacillin-tazobactam (ZOSYN) IVPB  3.375 g (3.375 g Intravenous New Bag/Given 03/07/15 2242)  acetaminophen (TYLENOL) tablet 1,000 mg (1,000 mg Oral Given 03/07/15 2057)  sodium chloride 0.9 % bolus 1,000 mL (1,000 mLs Intravenous New Bag/Given 03/07/15 2238)  sodium chloride 0.9 % bolus 1,000 mL (1,000 mLs Intravenous New Bag/Given 03/07/15 2242)       Noemi Chapel, MD 03/07/15 2332

## 2015-03-08 ENCOUNTER — Inpatient Hospital Stay (HOSPITAL_COMMUNITY): Payer: 59

## 2015-03-08 DIAGNOSIS — I9589 Other hypotension: Secondary | ICD-10-CM | POA: Diagnosis not present

## 2015-03-08 DIAGNOSIS — E8809 Other disorders of plasma-protein metabolism, not elsewhere classified: Secondary | ICD-10-CM | POA: Diagnosis not present

## 2015-03-08 DIAGNOSIS — E858 Other amyloidosis: Secondary | ICD-10-CM | POA: Diagnosis not present

## 2015-03-08 DIAGNOSIS — E859 Amyloidosis, unspecified: Secondary | ICD-10-CM | POA: Diagnosis present

## 2015-03-08 DIAGNOSIS — R509 Fever, unspecified: Secondary | ICD-10-CM

## 2015-03-08 DIAGNOSIS — E871 Hypo-osmolality and hyponatremia: Secondary | ICD-10-CM | POA: Diagnosis present

## 2015-03-08 DIAGNOSIS — D630 Anemia in neoplastic disease: Secondary | ICD-10-CM | POA: Diagnosis present

## 2015-03-08 DIAGNOSIS — R6521 Severe sepsis with septic shock: Secondary | ICD-10-CM | POA: Diagnosis present

## 2015-03-08 DIAGNOSIS — I959 Hypotension, unspecified: Secondary | ICD-10-CM | POA: Diagnosis not present

## 2015-03-08 DIAGNOSIS — Z9221 Personal history of antineoplastic chemotherapy: Secondary | ICD-10-CM | POA: Diagnosis not present

## 2015-03-08 DIAGNOSIS — E440 Moderate protein-calorie malnutrition: Secondary | ICD-10-CM | POA: Diagnosis present

## 2015-03-08 DIAGNOSIS — A419 Sepsis, unspecified organism: Principal | ICD-10-CM

## 2015-03-08 DIAGNOSIS — E876 Hypokalemia: Secondary | ICD-10-CM | POA: Diagnosis present

## 2015-03-08 DIAGNOSIS — R601 Generalized edema: Secondary | ICD-10-CM | POA: Diagnosis not present

## 2015-03-08 DIAGNOSIS — R579 Shock, unspecified: Secondary | ICD-10-CM | POA: Diagnosis not present

## 2015-03-08 DIAGNOSIS — J90 Pleural effusion, not elsewhere classified: Secondary | ICD-10-CM | POA: Diagnosis present

## 2015-03-08 LAB — CBC
HEMATOCRIT: 27.4 % — AB (ref 36.0–46.0)
Hemoglobin: 9.6 g/dL — ABNORMAL LOW (ref 12.0–15.0)
MCH: 29.7 pg (ref 26.0–34.0)
MCHC: 35 g/dL (ref 30.0–36.0)
MCV: 84.8 fL (ref 78.0–100.0)
PLATELETS: 269 10*3/uL (ref 150–400)
RBC: 3.23 MIL/uL — AB (ref 3.87–5.11)
RDW: 19.1 % — ABNORMAL HIGH (ref 11.5–15.5)
WBC: 10.2 10*3/uL (ref 4.0–10.5)

## 2015-03-08 LAB — APTT: APTT: 57 s — AB (ref 24–37)

## 2015-03-08 LAB — BASIC METABOLIC PANEL
Anion gap: 5 (ref 5–15)
BUN: 10 mg/dL (ref 6–20)
CHLORIDE: 103 mmol/L (ref 101–111)
CO2: 29 mmol/L (ref 22–32)
CREATININE: 0.62 mg/dL (ref 0.44–1.00)
Calcium: 6.9 mg/dL — ABNORMAL LOW (ref 8.9–10.3)
GFR calc non Af Amer: >60 mL/min (ref >60)
Glucose, Bld: 92 mg/dL (ref 65–99)
POTASSIUM: 2.7 mmol/L — AB (ref 3.5–5.1)
Sodium: 137 mmol/L (ref 135–145)

## 2015-03-08 LAB — PROCALCITONIN

## 2015-03-08 LAB — PROTIME-INR
INR: 1.48 (ref 0.00–1.49)
PROTHROMBIN TIME: 18 s — AB (ref 11.6–15.2)

## 2015-03-08 LAB — CORTISOL: CORTISOL PLASMA: 6.3 ug/dL

## 2015-03-08 LAB — I-STAT CG4 LACTIC ACID, ED: LACTIC ACID, VENOUS: 0.49 mmol/L — AB (ref 0.5–2.0)

## 2015-03-08 LAB — MAGNESIUM: Magnesium: 1.5 mg/dL — ABNORMAL LOW (ref 1.7–2.4)

## 2015-03-08 LAB — LACTIC ACID, PLASMA
Lactic Acid, Venous: 0.9 mmol/L (ref 0.5–2.0)
Lactic Acid, Venous: 1.4 mmol/L (ref 0.5–2.0)

## 2015-03-08 LAB — MRSA PCR SCREENING: MRSA by PCR: NEGATIVE

## 2015-03-08 MED ORDER — SODIUM CHLORIDE 0.9 % IJ SOLN: 3.0000 mL | INTRAMUSCULAR | Status: DC | PRN

## 2015-03-08 MED ORDER — PROCHLORPERAZINE MALEATE 10 MG PO TABS
10.0000 mg | ORAL_TABLET | Freq: Four times a day (QID) | ORAL | Status: DC | PRN
Start: 2015-03-08 — End: 2015-03-13
  Filled 2015-03-08: qty 1

## 2015-03-08 MED ORDER — ACETAMINOPHEN 650 MG RE SUPP: 650.0000 mg | Freq: Four times a day (QID) | RECTAL | Status: DC | PRN

## 2015-03-08 MED ORDER — POTASSIUM CHLORIDE 10 MEQ/100ML IV SOLN
INTRAVENOUS | Status: AC
Start: 2015-03-08 — End: 2015-03-08
  Filled 2015-03-08: qty 100

## 2015-03-08 MED ORDER — SODIUM CHLORIDE 0.9 % IV BOLUS (SEPSIS)
1000.0000 mL | Freq: Once | INTRAVENOUS | Status: AC
  Administered 2015-03-08: 1000 mL via INTRAVENOUS

## 2015-03-08 MED ORDER — MIDODRINE HCL 5 MG PO TABS
5.0000 mg | ORAL_TABLET | Freq: Two times a day (BID) | ORAL | Status: DC
  Administered 2015-03-08: 5 mg via ORAL
  Filled 2015-03-08 (×3): qty 1

## 2015-03-08 MED ORDER — PIPERACILLIN-TAZOBACTAM 3.375 G IVPB
3.3750 g | Freq: Three times a day (TID) | INTRAVENOUS | Status: DC
  Administered 2015-03-08 – 2015-03-10 (×7): 3.375 g via INTRAVENOUS
  Filled 2015-03-08 (×7): qty 50

## 2015-03-08 MED ORDER — ALBUMIN HUMAN 25 % IV SOLN
12.5000 g | Freq: Once | INTRAVENOUS | Status: AC
  Administered 2015-03-08: 12.5 g via INTRAVENOUS
  Filled 2015-03-08: qty 50

## 2015-03-08 MED ORDER — LACTATED RINGERS IV SOLN
INTRAVENOUS | Status: DC
  Administered 2015-03-08: 50 mL/h via INTRAVENOUS
  Administered 2015-03-10: 07:00:00 via INTRAVENOUS

## 2015-03-08 MED ORDER — ONDANSETRON HCL 4 MG/2ML IJ SOLN: 4.0000 mg | Freq: Four times a day (QID) | INTRAMUSCULAR | Status: DC | PRN

## 2015-03-08 MED ORDER — POTASSIUM CHLORIDE 10 MEQ/100ML IV SOLN
10.0000 meq | INTRAVENOUS | Status: AC
  Administered 2015-03-08 (×2): 10 meq via INTRAVENOUS

## 2015-03-08 MED ORDER — POTASSIUM CHLORIDE 20 MEQ/15ML (10%) PO SOLN
40.0000 meq | Freq: Once | ORAL | Status: AC
  Administered 2015-03-08: 40 meq via ORAL
  Filled 2015-03-08: qty 30

## 2015-03-08 MED ORDER — SODIUM CHLORIDE 0.9 % IJ SOLN
3.0000 mL | Freq: Two times a day (BID) | INTRAMUSCULAR | Status: DC
  Administered 2015-03-08: 3 mL via INTRAVENOUS

## 2015-03-08 MED ORDER — SODIUM CHLORIDE 0.9 % IV BOLUS (SEPSIS)
500.0000 mL | Freq: Once | INTRAVENOUS | Status: AC
  Administered 2015-03-08: 500 mL via INTRAVENOUS

## 2015-03-08 MED ORDER — POTASSIUM CHLORIDE CRYS ER 20 MEQ PO TBCR
20.0000 meq | EXTENDED_RELEASE_TABLET | Freq: Once | ORAL | Status: AC
  Administered 2015-03-08: 20 meq via ORAL
  Filled 2015-03-08: qty 1

## 2015-03-08 MED ORDER — BOOST / RESOURCE BREEZE PO LIQD
1.0000 | Freq: Two times a day (BID) | ORAL | Status: DC
  Administered 2015-03-08 – 2015-03-13 (×9): 1 via ORAL

## 2015-03-08 MED ORDER — ENOXAPARIN SODIUM 40 MG/0.4ML ~~LOC~~ SOLN
40.0000 mg | Freq: Every day | SUBCUTANEOUS | Status: DC
  Administered 2015-03-08 – 2015-03-12 (×6): 40 mg via SUBCUTANEOUS
  Filled 2015-03-08 (×7): qty 0.4

## 2015-03-08 MED ORDER — MAGNESIUM SULFATE 2 GM/50ML IV SOLN
2.0000 g | Freq: Once | INTRAVENOUS | Status: AC
  Administered 2015-03-08: 2 g via INTRAVENOUS
  Filled 2015-03-08: qty 50

## 2015-03-08 MED ORDER — ACYCLOVIR 400 MG PO TABS
400.0000 mg | ORAL_TABLET | Freq: Two times a day (BID) | ORAL | Status: DC
  Administered 2015-03-08 – 2015-03-13 (×12): 400 mg via ORAL
  Filled 2015-03-08 (×16): qty 1

## 2015-03-08 MED ORDER — HYDROMORPHONE HCL 1 MG/ML IJ SOLN: 0.5000 mg | INTRAMUSCULAR | Status: DC | PRN

## 2015-03-08 MED ORDER — ALBUMIN HUMAN 25 % IV SOLN
INTRAVENOUS | Status: AC
  Filled 2015-03-08: qty 50

## 2015-03-08 MED ORDER — ASPIRIN 325 MG PO TABS
325.0000 mg | ORAL_TABLET | Freq: Once | ORAL | Status: AC
  Administered 2015-03-08: 325 mg via ORAL
  Filled 2015-03-08: qty 1

## 2015-03-08 MED ORDER — POTASSIUM CHLORIDE ER 10 MEQ PO TBCR
20.0000 meq | EXTENDED_RELEASE_TABLET | Freq: Two times a day (BID) | ORAL | Status: DC
  Administered 2015-03-08 – 2015-03-13 (×12): 20 meq via ORAL
  Filled 2015-03-08 (×18): qty 2

## 2015-03-08 MED ORDER — ONDANSETRON HCL 4 MG PO TABS
4.0000 mg | ORAL_TABLET | Freq: Four times a day (QID) | ORAL | Status: DC | PRN
Start: 2015-03-08 — End: 2015-03-13

## 2015-03-08 MED ORDER — VANCOMYCIN HCL IN DEXTROSE 750-5 MG/150ML-% IV SOLN
750.0000 mg | Freq: Two times a day (BID) | INTRAVENOUS | Status: DC
  Administered 2015-03-08 – 2015-03-10 (×5): 750 mg via INTRAVENOUS
  Filled 2015-03-08 (×6): qty 150

## 2015-03-08 MED ORDER — VITAMIN D 1000 UNITS PO TABS
1000.0000 [IU] | ORAL_TABLET | Freq: Every day | ORAL | Status: DC
  Administered 2015-03-08 – 2015-03-13 (×6): 1000 [IU] via ORAL
  Filled 2015-03-08 (×6): qty 1

## 2015-03-08 MED ORDER — HYDROCORTISONE NA SUCCINATE PF 100 MG IJ SOLR
50.0000 mg | Freq: Four times a day (QID) | INTRAMUSCULAR | Status: DC
  Administered 2015-03-08 – 2015-03-11 (×12): 50 mg via INTRAVENOUS
  Filled 2015-03-08: qty 2
  Filled 2015-03-08 (×2): qty 1
  Filled 2015-03-08 (×2): qty 2
  Filled 2015-03-08: qty 1
  Filled 2015-03-08 (×2): qty 2
  Filled 2015-03-08 (×2): qty 1
  Filled 2015-03-08 (×4): qty 2
  Filled 2015-03-08: qty 1
  Filled 2015-03-08 (×2): qty 2

## 2015-03-08 MED ORDER — SODIUM CHLORIDE 0.9 % IV SOLN: 250.0000 mL | INTRAVENOUS | Status: DC | PRN

## 2015-03-08 MED ORDER — OXYCODONE HCL 5 MG PO TABS: 5.0000 mg | ORAL_TABLET | ORAL | Status: DC | PRN

## 2015-03-08 MED ORDER — SODIUM CHLORIDE 0.9 % IV SOLN
INTRAVENOUS | Status: DC
  Administered 2015-03-08: 01:00:00 via INTRAVENOUS

## 2015-03-08 MED ORDER — TORSEMIDE 5 MG PO TABS
25.0000 mg | ORAL_TABLET | Freq: Two times a day (BID) | ORAL | Status: DC
  Administered 2015-03-08: 25 mg via ORAL
  Filled 2015-03-08 (×3): qty 1

## 2015-03-08 MED ORDER — ACETAMINOPHEN 325 MG PO TABS: 650.0000 mg | ORAL_TABLET | Freq: Four times a day (QID) | ORAL | Status: DC | PRN

## 2015-03-08 MED ORDER — MIDODRINE HCL 5 MG PO TABS
5.0000 mg | ORAL_TABLET | Freq: Three times a day (TID) | ORAL | Status: DC
  Administered 2015-03-08 – 2015-03-13 (×15): 5 mg via ORAL
  Filled 2015-03-08 (×18): qty 1

## 2015-03-08 MED ORDER — SODIUM CHLORIDE 0.9 % IV SOLN: INTRAVENOUS | Status: DC

## 2015-03-08 MED ORDER — ONDANSETRON HCL 4 MG/2ML IJ SOLN: 4.0000 mg | Freq: Three times a day (TID) | INTRAMUSCULAR | Status: DC | PRN

## 2015-03-08 MED ORDER — POTASSIUM CHLORIDE 10 MEQ/100ML IV SOLN
10.0000 meq | INTRAVENOUS | Status: DC
  Administered 2015-03-08 (×2): 10 meq via INTRAVENOUS
  Filled 2015-03-08 (×4): qty 100

## 2015-03-08 NOTE — Progress Notes (Signed)
IP PROGRESS NOTE  Subjective:   Patient well-known to me with history of amyloidosis currently receiving treatment with Velcade, dexamethasone and cyclophosphamide on a weekly basis. She was hospitalized on 03/07/2015 after presenting with symptoms of fevers with her temperature was 101.6. She was noted to have a leukocytosis and hypotension in the emergency department. She is currently in intensive care unit for monitoring.  Clinically, she feels relatively fair and she is afebrile at this time. She continues to be relatively asymptomatic at this time. She does report lower extremity swelling and some slight ascites after aggressive hydration she have been receiving.  Objective:  Vital signs in last 24 hours: Temp:  [97.3 F (36.3 C)-101.7 F (38.7 C)] 97.8 F (36.6 C) (09/14 0800) Pulse Rate:  [76-108] 93 (09/14 1100) Resp:  [17-29] 25 (09/14 1100) BP: (64-105)/(34-60) 75/46 mmHg (09/14 1100) SpO2:  [93 %-99 %] 97 % (09/14 1100) Weight:  [127 lb (57.607 kg)-136 lb 3.9 oz (61.8 kg)] 136 lb 3.9 oz (61.8 kg) (09/14 0100) Weight change:  Last BM Date: 03/08/15  Intake/Output from previous day: 09/13 0701 - 09/14 0700 In: 3000 [IV Piggyback:2000] Out: 450 [Urine:450]  Mouth: mucous membranes moist, pharynx normal without lesions Resp: clear to auscultation bilaterally Cardio: regular rate and rhythm, S1, S2 normal, no murmur, click, rub or gallop GI: soft, non-tender; bowel sounds normal; no masses,  no organomegaly Extremities: extremities normal, atraumatic, no cyanosis or edema    Lab Results:  Recent Labs  03/07/15 2008 03/08/15 0340  WBC 13.0* 10.2  HGB 9.9* 9.6*  HCT 28.7* 27.4*  PLT 385 269    BMET  Recent Labs  03/07/15 2008 03/08/15 0340  NA 133* 137  K 3.3* 2.7*  CL 100* 103  CO2 28 29  GLUCOSE 101* 92  BUN 10 10  CREATININE 0.60 0.62  CALCIUM 7.3* 6.9*    Studies/Results: Dg Chest 2 View  03/07/2015   CLINICAL DATA:  Fever since 1300 hour  today, 8 hours prior.  EXAM: CHEST  2 VIEW  COMPARISON:  Chest CT 09/13/2014  FINDINGS: Small bilateral pleural effusions with adjacent atelectasis. No confluent airspace disease to suggest pneumonia. The heart size and mediastinal contours are normal. No pulmonary edema or pneumothorax. No acute osseous abnormalities are seen.  IMPRESSION: Small bilateral pleural effusions with adjacent atelectasis at the lung bases. No consolidation to suggest pneumonia.   Electronically Signed   By: Jeb Levering M.D.   On: 03/07/2015 21:21    Medications: I have reviewed the patient's current medications.  Assessment/Plan:  54 year old woman with the following issues:  1. Sepsis presenting with fever and hypotension. Blood cultures pain and currently pending. I agree with broad-spectrum antibiotics ( Vancomycin and Zosyn) as you're doing. She is certainly immunocompromised related to her plasma cell disorder and chemotherapy. She is not neutropenic and does not require any growth factor support.  Once she is clinically stable, and her blood cultures are negative certainly antibiotics be discontinued.   2. AL amyloidosis currently under chemotherapy with Velcade, dexamethasone and Cytoxan. These are given on a weekly basis and treatment will be withheld this week.  3. 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 Aggressive hydration were certainly contribute to that. She might require gentle diuresis once her hemodynamics are stable.     [facility] 03/08/2015, 11:35 AM

## 2015-03-08 NOTE — Progress Notes (Signed)
Patient ID: Anna Calderon, female   DOB: 06/12/1961, 54 y.o.   MRN: [id number]  [facility] PROGRESS NOTE  Anna Calderon QJF:[id number] DOB: Sep 17, 1960 DOA: 03/07/2015 PCP: Pcp Not In System   Brief narrative:    54 y.o. 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  In ED, patient had T101.69F, SBP in 80s, she was given 2 L of normal saline and sepsis workup was initiated. Patient was started on IV Vancomycin and Zosyn and [facility] asked to admit for further evaluation  Assessment/Plan:    Active Problems:   Sepsis of unclear etiology/Shock  - please note that pt met sepsis criteria on admission with T 101.95F, HR up to 108, RR up to 28, SBP as low as in 80's and poorly responsive to almost now 4 L NS - pt is currently on vancomycin and zosyn and will continue the same regimen, day #2, continue stress dose steroids  - as BP still low, PCCM was consulted in case pt needs to be placed on pressors, continue Midodrine  - continue to investigate source of sepsis, please note that UA is unremarkable, CXR with no signs of PNA - will need to consider CT chest or CT abd for further work up if fever persists - follow up on blood cultures  - appreciate PCCM input     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, appreciate his input  - pt is currently under chemotherapy with Velcade, dexamethasone and Cytoxan - these are given on a weekly basis and treatment will be withheld this week due to acute illness     Acute hypokalemia  - with underlying hypomagnesemia - continue to supplement both electrolytes - repeat BMP and Mg level in AM    Acute hyponatremia - IVF have been provided and Na is now WNL     Anasarca - in the setting of severe hypoalbuminemia and PCM, amyloid kidneys and proteinuria  - holding Lasix for now and allowing only very gentle hydration with lactated ringer  solution at 50 cc/hr  - weight is 136 lbs this AM, will continue to monitor clinical status, daily weights, strict I/O    Anemia of malignancy - Hg remains ~9, no signs of bleeding - repeat CBC in AM    Moderate PCM - in the setting of chronic illness, severe hypoalbuminemia  - nutritionist consulted   DVT prophylaxis - Lovenox SQ  Code Status: Full.  Family Communication:  plan of care discussed with the patient and family at bedside  Disposition Plan: Keep in SDU due to persistent hypotension and potential need for pressors   IV access:  Peripheral IV  Procedures and diagnostic studies:    Dg Chest 2 View 03/07/2015   Small bilateral pleural effusions with adjacent atelectasis at the lung bases. No consolidation to suggest pneumonia.    Medical Consultants:  PCCM Oncology   Other Consultants:  None  IAnti-Infectives:   Zosyn 9/13 -->  Faye Ramsay, MD  [facility] Pager [phone]  If 7PM-7AM, please contact night-coverage [email or web address] Password [facility] 03/08/2015, 11:49 AM     HPI/Subjective: No events overnight. Reports feeling tightness in upper and lower extremities.   Objective: Filed Vitals:   03/08/15 1015 03/08/15 1030 03/08/15 1045 03/08/15 1100  BP:  74/46  75/46  Pulse: 94 94 92 93  Temp:      TempSrc:  Resp: '28 29 25 25  ' Height:      Weight:      SpO2: 97% 99% 97% 97%    Intake/Output Summary (Last 24 hours) at 03/08/15 1149 Last data filed at 03/08/15 1100  Gross per 24 hour  Intake 3018.33 ml  Output    450 ml  Net 2568.33 ml    Exam:   General:  Pt is alert, follows commands appropriately, not in acute distress  Cardiovascular: Regular rate and rhythm, S1/S2, no murmurs, no rubs, no gallops  Respiratory: Clear to auscultation bilaterally, no wheezing, no crackles, no rhonchi  Abdomen: Soft, non tender, slightly distended, bowel sounds present, no guarding  Extremities: +2-3 bilateral LE pitting edema, bilateral UE edema,  pulses DP and PT palpable bilaterally  Neuro: Grossly nonfocal  Data Reviewed: Basic Metabolic Panel:  Recent Labs Lab 03/02/15 0939 03/07/15 2008 03/08/15 0340  NA 138 133* 137  K 3.8 3.3* 2.7*  CL  --  100* 103  CO2 30* 28 29  GLUCOSE 89 101* 92  BUN 6.5* 10 10  CREATININE 0.6 0.60 0.62  CALCIUM 7.6* 7.3* 6.9*  MG  --   --  1.5*   Liver Function Tests:  Recent Labs Lab 03/02/15 0939 03/07/15 2008  AST 24 23  ALT 15 13*  ALKPHOS 78 66  BILITOT 0.28 0.8  PROT 3.9* 3.3*  ALBUMIN 0.8* 1.2*   CBC:  Recent Labs Lab 03/02/15 0939 03/07/15 2008 03/08/15 0340  WBC 6.2 13.0* 10.2  NEUTROABS 4.9 11.8*  --   HGB 10.9* 9.9* 9.6*  HCT 32.4* 28.7* 27.4*  MCV 84.4 83.7 84.8  PLT 406* 385 269    Recent Results (from the past 240 hour(s))  Culture, blood (routine x 2)     Status: None (Preliminary result)   Collection Time: 03/07/15  8:25 PM  Result Value Ref Range Status   Specimen Description   Final    BLOOD RIGHT ARM Performed at [facility]    Special Requests BOTTLES DRAWN AEROBIC AND ANAEROBIC 10ML  Final   Culture PENDING  Incomplete   Report Status PENDING  Incomplete  MRSA PCR Screening     Status: None   Collection Time: 03/08/15  1:40 AM  Result Value Ref Range Status   MRSA by PCR NEGATIVE NEGATIVE Final    Comment:        The GeneXpert MRSA Assay (FDA approved for NASAL specimens only), is one component of a comprehensive MRSA colonization surveillance program. It is not intended to diagnose MRSA infection nor to guide or monitor treatment for MRSA infections.      Scheduled Meds: . acyclovir  400 mg Oral BID  . cholecalciferol  1,000 Units Oral Daily  . enoxaparin (LOVENOX) injection  40 mg Subcutaneous QHS  . hydrocortisone sod succinate (SOLU-CORTEF) inj  50 mg Intravenous Q6H  . magnesium sulfate 1 - 4 g bolus IVPB  2 g Intravenous Once  . midodrine  5 mg Oral TID  . piperacillin-tazobactam (ZOSYN)  IV  3.375 g Intravenous  3 times per day  . potassium chloride  20 mEq Oral BID  . potassium chloride  10 mEq Intravenous Q1 Hr x 3  . vancomycin  750 mg Intravenous Q12H   Continuous Infusions: . lactated ringers 50 mL/hr (03/08/15 1038)

## 2015-03-08 NOTE — Progress Notes (Signed)
CRITICAL VALUE ALERT  Critical value received:  Potassium 2.7  Date of notification:  03/08/15  Time of notification:  3710  Critical value read back:Yes.    Nurse who received alert: Javier Glazier  MD notified (1st page):  Baltazar Najjar  Time of first page: [phone]  MD notified (2nd page):  Time of second page:  Responding MD:  Baltazar Najjar  Time MD responded:  [phone]

## 2015-03-08 NOTE — Progress Notes (Signed)
Pt's BP has been low since admission. She has received multiple boluses. Discussed with Dr. Arnoldo Morale, admitting MD. Advised to bolus again and try albumin. Add Vancomycin per pharm consult. Pt's normal BP is 90. She is mentating well and even walked to bathroom without incident.  KJKG, NP [facility]

## 2015-03-08 NOTE — H&P (Addendum)
[facility] Admission History and Physical       Anna Calderon UQJ:[id number] DOB: [birth year] DOA: 03/07/2015  Referring physician: EDP  PCP: Pcp Not In System  Specialists:   Chief Complaint: Fever  HPI: Anna Calderon is a 54 y.o. 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  She denies any Cough, or SOB, Nausea,Vomiting or Diarrhea or Dysuria.   She had a fever to 101.5.  She was foound to have a Systolic blood pressure in the 80's, and she was administered 2 liters of NSS for fluid resuscitation.   A Sepsis workup was initiated and she was placed on IV Zosyn and referred for admission.    Of note She reports that her blood pressures run low  And are normlaly anywhere for 80 to 389 systolic.      Review of Systems:  Constitutional: No Weight Loss, No Weight Gain, Night Sweats, +Fevers, +Chills, Dizziness, Light Headedness, Fatigue, + Generalized Weakness HEENT: No Headaches, Difficulty Swallowing,Tooth/Dental Problems,Sore Throat,  No Sneezing, Rhinitis, Ear Ache, Nasal Congestion, or Post Nasal Drip,  Cardio-vascular:  No Chest pain, Orthopnea, PND, Edema in Lower Extremities, Anasarca, Dizziness, Palpitations  Resp: No Dyspnea, No DOE, No Productive Cough, No Non-Productive Cough, No Hemoptysis, No Wheezing.    GI: No Heartburn, Indigestion, Abdominal Pain, Nausea, Vomiting, Diarrhea, Constipation, Hematemesis, Hematochezia, Melena, Change in Bowel Habits,  Loss of Appetite  GU: No Dysuria, No Change in Color of Urine, No Urgency or Urinary Frequency, No Flank pain.  Musculoskeletal: No Joint Pain or Swelling, No Decreased Range of Motion, No Back Pain.  Neurologic: No Syncope, No Seizures, Muscle Weakness, Paresthesia, Vision Disturbance or Loss, No Diplopia, No Vertigo, No Difficulty Walking,  Skin: No Rash or Lesions. Psych: No Change in  Mood or Affect, No Depression or Anxiety, No Memory loss, No Confusion, or Hallucinations   Past Medical History  Diagnosis Date  . Proteinuria   . Edema   . Cancer      Past Surgical History  Procedure Laterality Date  . Tubal ligation        Prior to Admission medications   Medication Sig Start Date End Date Taking? Authorizing Provider  acyclovir (ZOVIRAX) 400 MG tablet TAKE 1 TABLET BY MOUTH TWICE DAILY 02/21/15  Yes Wyatt Portela, MD  aspirin 325 MG tablet Take 325 mg by mouth once.   Yes Historical Provider, MD  Cholecalciferol (VITAMIN D3) 1000 UNITS CAPS Take 1 tablet by mouth daily.   Yes Historical Provider, MD  cyclophosphamide (CYTOXAN) 50 MG tablet Take 9 tablets weekly on an empty stomach on the day of your chemotherapy. 03/01/15  Yes Wyatt Portela, MD  dexamethasone (DECADRON) 4 MG tablet Take 5 tablets every week with chemotherapy. 11/15/14  Yes Wyatt Portela, MD  midodrine (PROAMATINE) 5 MG tablet Take 5 mg by mouth 2 (two) times daily.   Yes Historical Provider, MD  potassium chloride (K-DUR) 10 MEQ tablet Take 20 mEq by mouth 2 (two) times daily. 10/25/14 10/25/15 Yes Historical Provider, MD  prochlorperazine (COMPAZINE) 10 MG tablet Take 1 tablet (10 mg total) by mouth every 6 (six) hours as needed for nausea or vomiting. 11/15/14  Yes Wyatt Portela, MD  torsemide (DEMADEX) 20 MG tablet Take 25 mg by mouth 2 (two) times daily. 11/04/14 11/04/15 Yes Historical Provider, MD  No Known Allergies    Social History:  reports that she has never smoked. She does not have any smokeless tobacco history on file. She reports that she does not drink alcohol or use illicit drugs.     No family history on file.     Physical Exam:  GEN:  Pleasant  54 y.o. Asian female examined and in no acute distress; cooperative with exam Filed Vitals:   03/07/15 1949 03/07/15 2026 03/07/15 2153 03/08/15 0000  BP: 105/60 95/54 83/45  86/51  Pulse: 108 104 93 89  Temp: 101.7 F (38.7  C) 101.7 F (38.7 C) 99.2 F (37.3 C)   TempSrc: Oral Oral Oral   Resp: 20 28 23 26   Height: 5\' 1"  (1.549 m)     Weight: 57.607 kg (127 lb)     SpO2: 95% 93% 94% 93%   Blood pressure 86/51, pulse 89, temperature 99.2 F (37.3 C), temperature source Oral, resp. rate 26, height 5\' 1"  (1.549 m), weight 57.607 kg (127 lb), SpO2 93 %. PSYCH: She is alert and oriented x4; does not appear anxious does not appear depressed; affect is normal HEENT: Normocephalic and Atraumatic, Mucous membranes pink; PERRLA; EOM intact; Fundi:  Benign;  No scleral icterus, Nares: Patent, Oropharynx: Clear, Fair Dentition,    Neck:  FROM, No Cervical Lymphadenopathy nor Thyromegaly or Carotid Bruit; No JVD; Breasts:: Not examined CHEST WALL: No tenderness CHEST: Normal respiration, clear to auscultation bilaterally HEART: Regular rate and rhythm; no murmurs rubs or gallops BACK: No kyphosis or scoliosis; No CVA tenderness ABDOMEN: Positive Bowel Sounds, Soft Non-Tender, No Rebound or Guarding; No Masses, No Organomegaly. Rectal Exam: Not done EXTREMITIES: No Cyanosis, Clubbing, 3+ Edema BLEs; No Ulcerations. Genitalia: not examined PULSES: 2+ and symmetric SKIN: Normal hydration no rash or ulceration CNS:  Alert and Oriented x 4, No Focal Deficits Vascular: pulses palpable throughout    Labs on Admission:  Basic Metabolic Panel:  Recent Labs Lab 03/02/15 0939 03/07/15 2008  NA 138 133*  K 3.8 3.3*  CL  --  100*  CO2 30* 28  GLUCOSE 89 101*  BUN 6.5* 10  CREATININE 0.6 0.60  CALCIUM 7.6* 7.3*   Liver Function Tests:  Recent Labs Lab 03/02/15 0939 03/07/15 2008  AST 24 23  ALT 15 13*  ALKPHOS 78 66  BILITOT 0.28 0.8  PROT 3.9* 3.3*  ALBUMIN 0.8* 1.2*   No results for input(s): LIPASE, AMYLASE in the last 168 hours. No results for input(s): AMMONIA in the last 168 hours. CBC:  Recent Labs Lab 03/02/15 0939 03/07/15 2008  WBC 6.2 13.0*  NEUTROABS 4.9 11.8*  HGB 10.9* 9.9*  HCT  32.4* 28.7*  MCV 84.4 83.7  PLT 406* 385   Cardiac Enzymes: No results for input(s): CKTOTAL, CKMB, CKMBINDEX, TROPONINI in the last 168 hours.  BNP (last 3 results) No results for input(s): BNP in the last 8760 hours.  ProBNP (last 3 results) No results for input(s): PROBNP in the last 8760 hours.  CBG: No results for input(s): GLUCAP in the last 168 hours.  Radiological Exams on Admission: Dg Chest 2 View  03/07/2015   CLINICAL DATA:  Fever since 1300 hour today, 8 hours prior.  EXAM: CHEST  2 VIEW  COMPARISON:  Chest CT 09/13/2014  FINDINGS: Small bilateral pleural effusions with adjacent atelectasis. No confluent airspace disease to suggest pneumonia. The heart size and mediastinal contours are normal. No pulmonary edema or pneumothorax. No acute osseous abnormalities are seen.  IMPRESSION: Small  bilateral pleural effusions with adjacent atelectasis at the lung bases. No consolidation to suggest pneumonia.   Electronically Signed   By: Jeb Levering M.D.   On: 03/07/2015 21:21           Assessment/Plan:  55 y.o. female with  Active Problems:     1.    Severe sepsis with septic shock/SIRS (systemic inflammatory response syndrome)   Sepsis Protocol   IVFs   IV Vancomycin and Zosyn             2.     Hypotension- due to #1   Hold lasix   IVFs   Continue Midodrine Rx   Monitor BPs   Check Cortisol Level    3.      Hypokalemia   Replace K+ PO and IV   Check Magnesium Level  Replace PRN   4.     AL amyloidosis   Notify Dr Nelva Nay (Heme/Onc this AM)       5.    Hypoalbuminemia- due to Proteinuria         6.    Anasarca- due to #4       7    DVT Prophylaxis   Lovenox     Code Status:     FULL CODE        Family Communication:   Husband and Daughter at Bedside    Disposition Plan:    Inpatient Status        Time spent:  61 Minutes      Theressa Millard [facility] Pager [phone]   If [place] Please Contact the Day Rounding Team MD for  [facility]  If 7PM-7AM, Please Contact Night-Floor Coverage  [email or web address] Password [email or web address] 03/08/2015, 12:26 AM     ADDENDUM:   Patient was seen and examined on 03/08/2015

## 2015-03-08 NOTE — Progress Notes (Signed)
ANTIBIOTIC CONSULT NOTE - INITIAL  Pharmacy Consult for vancomycin Indication: Sepsis, fever  No Known Allergies  Patient Measurements: Height: 5\' 1"  (154.9 cm) Weight: 136 lb 3.9 oz (61.8 kg) IBW/kg (Calculated) : 47.8 Adjusted Body Weight:   Vital Signs: Temp: 97.7 F (36.5 C) (09/14 0600) Temp Source: Oral (09/14 0600) BP: 68/36 mmHg (09/14 0600) Pulse Rate: 78 (09/14 0600) Intake/Output from previous day: 09/13 0701 - 09/14 0700 In: 2950 [IV Piggyback:1950] Out: 450 [Urine:450] Intake/Output from this shift: Total I/O In: 2950 [Other:1000; IV Piggyback:1950] Out: 450 [Urine:450]  Labs:  Recent Labs  03/07/15 2008 03/08/15 0340  WBC 13.0* 10.2  HGB 9.9* 9.6*  PLT 385 269  CREATININE 0.60 0.62   Estimated Creatinine Clearance: 67 mL/min (by C-G formula based on Cr of 0.62). No results for input(s): VANCOTROUGH, VANCOPEAK, VANCORANDOM, GENTTROUGH, GENTPEAK, GENTRANDOM, TOBRATROUGH, TOBRAPEAK, TOBRARND, AMIKACINPEAK, AMIKACINTROU, AMIKACIN in the last 72 hours.   Microbiology: Recent Results (from the past 720 hour(s))  Culture, blood (routine x 2)     Status: None (Preliminary result)   Collection Time: 03/07/15  8:25 PM  Result Value Ref Range Status   Specimen Description   Final    BLOOD RIGHT ARM Performed at [facility]    Special Requests BOTTLES DRAWN AEROBIC AND ANAEROBIC 10ML  Final   Culture PENDING  Incomplete   Report Status PENDING  Incomplete  MRSA PCR Screening     Status: None   Collection Time: 03/08/15  1:40 AM  Result Value Ref Range Status   MRSA by PCR NEGATIVE NEGATIVE Final    Comment:        The GeneXpert MRSA Assay (FDA approved for NASAL specimens only), is one component of a comprehensive MRSA colonization surveillance program. It is not intended to diagnose MRSA infection nor to guide or monitor treatment for MRSA infections.     Medical History: Past Medical History  Diagnosis Date  . Proteinuria   .  Edema   . Cancer     Medications:  Anti-infectives    Start     Dose/Rate Route Frequency Ordered Stop   03/08/15 0600  piperacillin-tazobactam (ZOSYN) IVPB 3.375 g     3.375 g 12.5 mL/hr over 240 Minutes Intravenous 3 times per day 03/08/15 0108     03/08/15 0600  vancomycin (VANCOCIN) IVPB 750 mg/150 ml premix     750 mg 150 mL/hr over 60 Minutes Intravenous Every 12 hours 03/08/15 0505     03/08/15 0115  acyclovir (ZOVIRAX) tablet 400 mg     400 mg Oral 2 times daily 03/08/15 0101     03/07/15 2245  piperacillin-tazobactam (ZOSYN) IVPB 3.375 g  Status:  Discontinued     3.375 g 100 mL/hr over 30 Minutes Intravenous 3 times per day 03/07/15 2236 03/08/15 0107     Assessment: Patient with fever in ED.  Patient currently undergoing chemotheray.  Goal of Therapy:  Vancomycin trough level 15-20 mcg/ml  Plan:  Measure antibiotic drug levels at steady state Follow up culture results Vancomycin 750mg  iv q12hr  Anna Calderon 03/08/2015,6:34 AM

## 2015-03-08 NOTE — Plan of Care (Signed)
Problem: Phase I Progression Outcomes Goal: Pain controlled with appropriate interventions Outcome: Not Applicable Date Met:  89/48/34 Denies pain. Goal: OOB as tolerated unless otherwise ordered Outcome: Progressing Bedside commode Goal: Hemodynamically stable Outcome: Not Progressing BP low.

## 2015-03-08 NOTE — Consult Note (Signed)
Name: Anna Calderon MRN: [id number] DOB: Jun 09, 1961    ADMISSION DATE:  03/07/2015 CONSULTATION DATE:  9/14  REFERRING MD :  Doyle Askew   CHIEF COMPLAINT:  Hypotension   BRIEF PATIENT DESCRIPTION:  54 year old female currently on Velcade, Cytoxan and dexamethasone as treatment for Amyloidosis. Last rx was 9/8. Presents to ER on 9/13 w/ CC: 1d ho fever, chills and HA. No other symptoms. Did have sick exposure (husband had similar sxs). In ER temp >101. BP in 70s (she is chronically hypotensive w/ SBP in 90s on Midodrine at home). She was admitted w/ working dx of sepsis/septic shock. Lactic acid was 0.97. Received several liters of NS as well as 12.5 gm albumin. Her BP remained in 70-80s so PCCM was asked to eval.   SIGNIFICANT EVENTS    STUDIES:     HISTORY OF PRESENT ILLNESS:   See above   PAST MEDICAL HISTORY :   has a past medical history of Proteinuria; Edema; and Cancer.  has past surgical history that includes Tubal ligation. Prior to Admission medications   Medication Sig Start Date End Date Taking? Authorizing Provider  acyclovir (ZOVIRAX) 400 MG tablet TAKE 1 TABLET BY MOUTH TWICE DAILY 02/21/15  Yes Wyatt Portela, MD  aspirin 325 MG tablet Take 325 mg by mouth once.   Yes Historical Provider, MD  Cholecalciferol (VITAMIN D3) 1000 UNITS CAPS Take 1 tablet by mouth daily.   Yes Historical Provider, MD  cyclophosphamide (CYTOXAN) 50 MG tablet Take 9 tablets weekly on an empty stomach on the day of your chemotherapy. 03/01/15  Yes Wyatt Portela, MD  dexamethasone (DECADRON) 4 MG tablet Take 5 tablets every week with chemotherapy. 11/15/14  Yes Wyatt Portela, MD  midodrine (PROAMATINE) 5 MG tablet Take 5 mg by mouth 2 (two) times daily.   Yes Historical Provider, MD  potassium chloride (K-DUR) 10 MEQ tablet Take 20 mEq by mouth 2 (two) times daily. 10/25/14 10/25/15 Yes Historical Provider, MD  prochlorperazine (COMPAZINE) 10 MG tablet Take 1 tablet (10 mg total) by mouth every 6 (six)  hours as needed for nausea or vomiting. 11/15/14  Yes Wyatt Portela, MD  torsemide (DEMADEX) 20 MG tablet Take 25 mg by mouth 2 (two) times daily. 11/04/14 11/04/15 Yes Historical Provider, MD   No Known Allergies  FAMILY HISTORY:  family history is not on file. SOCIAL HISTORY:  reports that she has never smoked. She does not have any smokeless tobacco history on file. She reports that she does not drink alcohol or use illicit drugs.  REVIEW OF SYSTEMS:   Constitutional: Negative for fever, chills ++, weight loss, malaise/fatigue and diaphoresis.  HENT: Negative for hearing loss, ear pain, nosebleeds, congestion, sore throat, neck pain, tinnitus and ear discharge.  + HA  Eyes: Negative for blurred vision, double vision, photophobia, pain, discharge and redness.  Respiratory: Negative for cough, hemoptysis, sputum production, shortness of breath, wheezing and stridor.   Cardiovascular: Negative for chest pain, palpitations, orthopnea, claudication, leg swelling and PND.  Gastrointestinal: Negative for heartburn, nausea, vomiting, abdominal pain, diarrhea, constipation, blood in stool and melena.  Genitourinary: Negative for dysuria, urgency, frequency, hematuria and flank pain.  Musculoskeletal: Negative for myalgias, back pain, joint pain and falls.  Skin: Negative for itching and rash.  Neurological: Negative for dizziness, tingling, tremors, sensory change, speech change, focal weakness, seizures, loss of consciousness, weakness and headaches.  Endo/Heme/Allergies: Negative for environmental allergies and polydipsia. Does not bruise/bleed easily.  SUBJECTIVE:  No complaints  VITAL SIGNS: Temp:  [97.3 F (36.3 C)-101.7 F (38.7 C)] 97.8 F (36.6 C) (09/14 0800) Pulse Rate:  [76-108] 83 (09/14 0900) Resp:  [17-29] 29 (09/14 0900) BP: (68-105)/(34-60) 79/54 mmHg (09/14 0900) SpO2:  [93 %-99 %] 98 % (09/14 0900) Weight:  [57.607 kg (127 lb)-61.8 kg (136 lb 3.9 oz)] 61.8 kg (136 lb 3.9  oz) (09/14 0100)  PHYSICAL EXAMINATION: General:  54 year old female currently in no acute distress.  Neuro:  Awake, alert, no focal def  HEENT:  NCAT, no JVD  Cardiovascular:  rrr Lungs:  Clear, no accessory muscle use  Abdomen:  Soft, non-tender + bowel sounds  Musculoskeletal:  Intact  Skin:  LE trace edema    Recent Labs Lab 03/02/15 0939 03/07/15 2008 03/08/15 0340  NA 138 133* 137  K 3.8 3.3* 2.7*  CL  --  100* 103  CO2 30* 28 29  BUN 6.5* 10 10  CREATININE 0.6 0.60 0.62  GLUCOSE 89 101* 92    Recent Labs Lab 03/02/15 0939 03/07/15 2008 03/08/15 0340  HGB 10.9* 9.9* 9.6*  HCT 32.4* 28.7* 27.4*  WBC 6.2 13.0* 10.2  PLT 406* 385 269   Dg Chest 2 View  03/07/2015   CLINICAL DATA:  Fever since 1300 hour today, 8 hours prior.  EXAM: CHEST  2 VIEW  COMPARISON:  Chest CT 09/13/2014  FINDINGS: Small bilateral pleural effusions with adjacent atelectasis. No confluent airspace disease to suggest pneumonia. The heart size and mediastinal contours are normal. No pulmonary edema or pneumothorax. No acute osseous abnormalities are seen.  IMPRESSION: Small bilateral pleural effusions with adjacent atelectasis at the lung bases. No consolidation to suggest pneumonia.   Electronically Signed   By: Jeb Levering M.D.   On: 03/07/2015 21:21    ASSESSMENT / PLAN:  SIRS/Sepsis.  W/ persistent hypotension vs shock. Source is not clear. ? Viral URI. Reluctant to labile this hypotension as shock as she does not appear toxic, not ortho-static, and lactic acid was normal. Also has no evidence of end-organ injury. She is chronically on Midodrine w/ baseline BP 90s. Also on decadron so this would add the possibility of adrenal insuff to diff dx.  Plan Repeat I-stat lactate Increase Midodrine to q8 If LA has increased will place CVL to ensure adequate CVP 8-12 Consider low dose neo No Lasix today  Stress dose steroids F/u cultures Cont current abx Change MIVF to LR as she is at risk  for iatrogenic hyperchloremia   Anasarca Plan Would not treat at this point  Anemia of chronic disease Anticipate hgb will drop d/t dilutional effect Plan Cont [place] LMWH  Hypokalemia Plan Replace and recheck  Erick Colace ACNP-BC [place] Pager # [phone] OR # [phone] if no answer   03/08/2015, 9:54 AM

## 2015-03-08 NOTE — Progress Notes (Signed)
Initial Nutrition Assessment  DOCUMENTATION CODES:   Not applicable  INTERVENTION:  - Will order Boost Breeze BID, each supplement provides 250 kcal and 9 grams of protein - Encourage PO intake of meals and supplements as able - RD will continue to monitor for needs  NUTRITION DIAGNOSIS:   Inadequate oral intake related to poor appetite as evidenced by per patient/family report  GOAL:   Patient will meet greater than or equal to 90% of their needs  MONITOR:   PO intake, Supplement acceptance, Weight trends, Labs, I & O's  REASON FOR ASSESSMENT:   Malnutrition Screening Tool, Consult Assessment of nutrition requirement/status  ASSESSMENT:   54 y.o. 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 She denies any Cough, or SOB, Nausea,Vomiting or Diarrhea or Dysuria. She had a fever to 101.5. She was foound to have a Systolic blood pressure in the 80's, and she was administered 2 liters of NSS for fluid resuscitation. A Sepsis workup was initiated and she was placed on IV Zosyn and referred for admission.   Pt seen for MST and consult. BMI indicates overweight status. Pt states she ate some soup for lunch today; chart review indicates 10% completion of this item. She states she did not eat breakfast this AM.   Pt speaks broken English and short, simple sentences are easiest for her. She states she had a good appetite and was eating well PTA although she was having taste alteration consistent with chemotherapy. She is unable to give details beyond "food tasted bad" to describe this alteration.  She denies abdominal pain or nausea at this time. But states that appetite has been poor today and the day before admission. Unable to determine if she was consuming nutrition supplements PTA.   She states that PTA her weight would fluctuate often stating:  "up five pounds, down five pounds." She is unsure of a UBW. Per chart review, pt has gained 5 lbs in the past 13 days and weight was previously stable at 130-131 lbs since June 2016.  Not meeting needs. Will order Boost Breeze to supplement and monitor for tolerance/acceptance. No muscle or fat wasting noted. Medications reviewed. Labs reviewed; K: 2.7 mmol/L, Ca: 6.9 mmol/L, Mg: 1.5 mg/dL.   Diet Order:  DIET SOFT Room service appropriate?: Yes; Fluid consistency:: Thin  Skin:  Reviewed, no issues  Last BM:  9/14  Height:   Ht Readings from Last 1 Encounters:  03/08/15 5\' 1"  (1.549 m)    Weight:   Wt Readings from Last 1 Encounters:  03/08/15 136 lb 3.9 oz (61.8 kg)    Ideal Body Weight:  47.73 kg (kg)  BMI:  Body mass index is 25.76 kg/(m^2).  Estimated Nutritional Needs:   Kcal:  1850-2050  Protein:  75-85 grams  Fluid:  1.5-1.8 L/day  EDUCATION NEEDS:   No education needs identified at this time     Jarome Matin, RD, LDN Inpatient Clinical Dietitian Pager # [phone] After hours/weekend pager # [phone]

## 2015-03-09 ENCOUNTER — Other Ambulatory Visit: Payer: Self-pay

## 2015-03-09 ENCOUNTER — Ambulatory Visit: Payer: Self-pay

## 2015-03-09 DIAGNOSIS — R6521 Severe sepsis with septic shock: Secondary | ICD-10-CM

## 2015-03-09 DIAGNOSIS — I9589 Other hypotension: Secondary | ICD-10-CM

## 2015-03-09 DIAGNOSIS — E8809 Other disorders of plasma-protein metabolism, not elsewhere classified: Secondary | ICD-10-CM

## 2015-03-09 DIAGNOSIS — E876 Hypokalemia: Secondary | ICD-10-CM

## 2015-03-09 DIAGNOSIS — E858 Other amyloidosis: Secondary | ICD-10-CM

## 2015-03-09 LAB — BASIC METABOLIC PANEL
Anion gap: 5 (ref 5–15)
BUN: 12 mg/dL (ref 6–20)
CHLORIDE: 106 mmol/L (ref 101–111)
CO2: 26 mmol/L (ref 22–32)
CREATININE: 0.59 mg/dL (ref 0.44–1.00)
Calcium: 7.6 mg/dL — ABNORMAL LOW (ref 8.9–10.3)
GFR calc Af Amer: >60 mL/min (ref >60)
GFR calc non Af Amer: >60 mL/min (ref >60)
GLUCOSE: 120 mg/dL — AB (ref 65–99)
POTASSIUM: 4.4 mmol/L (ref 3.5–5.1)
Sodium: 137 mmol/L (ref 135–145)

## 2015-03-09 LAB — URINE CULTURE

## 2015-03-09 LAB — CBC
HEMATOCRIT: 29.8 % — AB (ref 36.0–46.0)
HEMOGLOBIN: 10 g/dL — AB (ref 12.0–15.0)
MCH: 28.2 pg (ref 26.0–34.0)
MCHC: 33.6 g/dL (ref 30.0–36.0)
MCV: 83.9 fL (ref 78.0–100.0)
Platelets: 327 10*3/uL (ref 150–400)
RBC: 3.55 MIL/uL — AB (ref 3.87–5.11)
RDW: 19 % — ABNORMAL HIGH (ref 11.5–15.5)
WBC: 8.4 10*3/uL (ref 4.0–10.5)

## 2015-03-09 LAB — MAGNESIUM: Magnesium: 1.9 mg/dL (ref 1.7–2.4)

## 2015-03-09 NOTE — Progress Notes (Signed)
Daughter of patient who is a Marine scientist at [employer] voiced concerns about pt receiving fluid boluses for low blood pressure on the night of admission 9/14. Daughter explained that she does not want her mother to receive fluid boluses for low blood pressure. It was explained to the daughter that this must be the decision of the patient and therefore I would need to ask her directly. When the patient was asked to confirm that if her blood pressure was low she did not want fluid blouses given, she stated "yes" that this was correct. MD aware of pt current BP and the patient's decision.  Lynnell Dike, RN

## 2015-03-09 NOTE — Progress Notes (Signed)
Patient ID: Anna Calderon, female   DOB: 1960-11-30, 54 y.o.   MRN: [id number]  [facility] PROGRESS NOTE  PRISILLA KOCSIS TOI:[id number] DOB: 1960-09-30 DOA: 03/07/2015 PCP: Pcp Not In System   Brief narrative:    54 y.o. 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  In ED, patient had T101.62F, SBP in 80s, she was given 2 L of normal saline and sepsis workup was initiated. Patient was started on IV Vancomycin and Zosyn and [facility] asked to admit for further evaluation  Assessment/Plan:    Active Problems:   Sepsis of unclear etiology/Shock  - please note that pt met sepsis criteria on admission with T 101.27F, HR up to 108, RR up to 28, SBP as low as in 80's and poorly responsive to almost now 4 L NS - pt is currently on vancomycin and zosyn and will continue the same regimen, day #4, continue stress dose steroids and taper down in next 24 hours if pt remains stable  - BP improved  - continue to investigate source of sepsis, please note that UA is unremarkable, CXR with no signs of PNA - will need to consider CT chest or CT abd for further work up if fever persists - follow up on blood cultures  - appreciate PCCM input     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, appreciate his input  - pt is currently under chemotherapy with Velcade, dexamethasone and Cytoxan - these are given on a weekly basis and treatment will be withheld this week due to acute illness     Acute hypokalemia  - with underlying hypomagnesemia - both electrolytes supplemented and WNL this AM     Acute hyponatremia - IVF have been provided and Na is now WNL     Anasarca - in the setting of severe hypoalbuminemia and PCM, amyloid kidneys and proteinuria  - holding Lasix for now, holding IVF as well  - weight is 136 lbs, will continue to monitor clinical status, daily weights, strict I/O     Anemia of malignancy - Hg remains ~9 - 10, no signs of bleeding - repeat CBC in AM    Moderate PCM - in the setting of chronic illness, severe hypoalbuminemia  - nutritionist consulted   DVT prophylaxis - Lovenox SQ  Code Status: Full.  Family Communication:  plan of care discussed with the patient and family at bedside  Disposition Plan: Keep in SDU due to persistent hypotension and potential need for pressors   IV access:  Peripheral IV  Procedures and diagnostic studies:    Dg Chest 2 View 03/07/2015   Small bilateral pleural effusions with adjacent atelectasis at the lung bases. No consolidation to suggest pneumonia.    Medical Consultants:  PCCM Oncology   Other Consultants:  None  IAnti-Infectives:   Zosyn 9/13 --> Vanc 9/13 -->  Faye Ramsay, MD  [facility] Pager [phone]  If 7PM-7AM, please contact night-coverage [email or web address] Password [facility] 03/09/2015, 6:57 AM   LOS: 1 day   HPI/Subjective: No events overnight. Reports feeling better.  Objective: Filed Vitals:   03/09/15 0105 03/09/15 0200 03/09/15 0400 03/09/15 0500  BP: 90/55 95/49    Pulse: 89 85    Temp:   97.6 F (36.4 C)   TempSrc:   Oral   Resp: 21 18    Height:      Weight:  65.2 kg (143 lb 11.8 oz)  SpO2: 98% 96%      Intake/Output Summary (Last 24 hours) at 03/09/15 8841 Last data filed at 03/09/15 0256  Gross per 24 hour  Intake 1258.33 ml  Output    175 ml  Net 1083.33 ml    Exam:   General:  Pt is alert, follows commands appropriately, not in acute distress  Cardiovascular: Regular rate and rhythm, S1/S2, no murmurs, no rubs, no gallops  Respiratory: Clear to auscultation bilaterally, no wheezing, no crackles, no rhonchi  Abdomen: Soft, non tender, slightly distended, bowel sounds present, no guarding  Extremities: +2 bilateral LE pitting edema, bilateral UE edema, pulses DP and PT palpable bilaterally  Neuro: Grossly nonfocal  Data Reviewed: Basic Metabolic  Panel:  Recent Labs Lab 03/02/15 0939 03/07/15 2008 03/08/15 0340 03/09/15 0336  NA 138 133* 137 137  K 3.8 3.3* 2.7* 4.4  CL  --  100* 103 106  CO2 30* _0 GLUCOSE 89 101* 92 120*  BUN 6.5* _1 CREATININE 0.6 0.60 0.62 0.59  CALCIUM 7.6* 7.3* 6.9* 7.6*  MG  --   --  1.5* 1.9   Liver Function Tests:  Recent Labs Lab 03/02/15 0939 03/07/15 2008  AST 24 23  ALT 15 13*  ALKPHOS 78 66  BILITOT 0.28 0.8  PROT 3.9* 3.3*  ALBUMIN 0.8* 1.2*   CBC:  Recent Labs Lab 03/02/15 0939 03/07/15 2008 03/08/15 0340 03/09/15 0336  WBC 6.2 13.0* 10.2 8.4  NEUTROABS 4.9 11.8*  --   --   HGB 10.9* 9.9* 9.6* 10.0*  HCT 32.4* 28.7* 27.4* 29.8*  MCV 84.4 83.7 84.8 83.9  PLT 406* 385 269 327    Recent Results (from the past 240 hour(s))  Culture, blood (routine x 2)     Status: None (Preliminary result)   Collection Time: 03/07/15  8:10 PM  Result Value Ref Range Status   Specimen Description BLOOD RIGHT WRIST  Final   Special Requests BOTTLES DRAWN AEROBIC AND ANAEROBIC 10ML  Final   Culture   Final    NO GROWTH < 24 HOURS Performed at [facility]    Report Status PENDING  Incomplete  Culture, blood (routine x 2)     Status: None (Preliminary result)   Collection Time: 03/07/15  8:25 PM  Result Value Ref Range Status   Specimen Description BLOOD RIGHT ARM  Final   Special Requests BOTTLES DRAWN AEROBIC AND ANAEROBIC 10ML  Final   Culture   Final    NO GROWTH < 24 HOURS Performed at [facility]    Report Status PENDING  Incomplete  MRSA PCR Screening     Status: None   Collection Time: 03/08/15  1:40 AM  Result Value Ref Range Status   MRSA by PCR NEGATIVE NEGATIVE Final    Comment:        The GeneXpert MRSA Assay (FDA approved for NASAL specimens only), is one component of a comprehensive MRSA colonization surveillance program. It is not intended to diagnose MRSA infection nor to guide or monitor treatment for MRSA infections.       Scheduled Meds: . acyclovir  400 mg Oral BID  . cholecalciferol  1,000 Units Oral Daily  . enoxaparin (LOVENOX) injection  40 mg Subcutaneous QHS  . feeding supplement  1 Container Oral BID BM  . hydrocortisone sod succinate (SOLU-CORTEF) inj  50 mg Intravenous Q6H  . midodrine  5 mg Oral TID  .  piperacillin-tazobactam (ZOSYN)  IV  3.375 g Intravenous 3 times per day  . potassium chloride  20 mEq Oral BID  . vancomycin  750 mg Intravenous Q12H   Continuous Infusions: . lactated ringers 50 mL/hr at 03/08/15 2000

## 2015-03-09 NOTE — Progress Notes (Signed)
Name: Anna Calderon MRN: [id number] DOB: 1960-09-11    ADMISSION DATE:  03/07/2015 CONSULTATION DATE:  9/14  REFERRING MD :  Doyle Askew   CHIEF COMPLAINT:  Hypotension   BRIEF PATIENT DESCRIPTION:  54 year old female currently on Velcade, Cytoxan and dexamethasone as treatment for Amyloidosis. Last rx was 9/8. Presents to ER on 9/13 w/ CC: 1d ho fever, chills and HA. No other symptoms. Did have sick exposure (husband had similar sxs). In ER temp >101. BP in 70s (she is chronically hypotensive w/ SBP in 90s on Midodrine at home). She was admitted w/ working dx of sepsis/septic shock. Lactic acid was 0.97. Received several liters of NS as well as 12.5 gm albumin. Her BP remained in 70-80s so PCCM was asked to eval.   SIGNIFICANT EVENTS    STUDIES:    SUBJECTIVE:  No complaints  VITAL SIGNS: Temp:  [97.4 F (36.3 C)-98.1 F (36.7 C)] 97.7 F (36.5 C) (09/15 0800) Pulse Rate:  [81-97] 86 (09/15 0400) Resp:  [18-31] 18 (09/15 0400) BP: (64-101)/(39-65) 93/58 mmHg (09/15 0400) SpO2:  [92 %-99 %] 95 % (09/15 0400) Weight:  [65.2 kg (143 lb 11.8 oz)] 65.2 kg (143 lb 11.8 oz) (09/15 0500) Room air  ' PHYSICAL EXAMINATION: General:  54 year old female currently in no acute distress.  Neuro:  Awake, alert, no focal def  HEENT:  NCAT, no JVD  Cardiovascular:  rrr Lungs:  Clear, no accessory muscle use, decreased posterior bases  Abdomen:  Soft, non-tender + bowel sounds  Musculoskeletal:  Intact  Skin: increased anasarca    Recent Labs Lab 03/07/15 2008 03/08/15 0340 03/09/15 0336  NA 133* 137 137  K 3.3* 2.7* 4.4  CL 100* 103 106  CO2 28 29 26   BUN 10 10 12   CREATININE 0.60 0.62 0.59  GLUCOSE 101* 92 120*    Recent Labs Lab 03/07/15 2008 03/08/15 0340 03/09/15 0336  HGB 9.9* 9.6* 10.0*  HCT 28.7* 27.4* 29.8*  WBC 13.0* 10.2 8.4  PLT 385 269 327   Dg Chest 2 View  03/07/2015   CLINICAL DATA:  Fever since 1300 hour today, 8 hours prior.  EXAM: CHEST  2 VIEW   COMPARISON:  Chest CT 09/13/2014  FINDINGS: Small bilateral pleural effusions with adjacent atelectasis. No confluent airspace disease to suggest pneumonia. The heart size and mediastinal contours are normal. No pulmonary edema or pneumothorax. No acute osseous abnormalities are seen.  IMPRESSION: Small bilateral pleural effusions with adjacent atelectasis at the lung bases. No consolidation to suggest pneumonia.   Electronically Signed   By: Jeb Levering M.D.   On: 03/07/2015 21:21   Dg Chest Port 1 View  03/08/2015   CLINICAL DATA:  Pulmonary edema.  EXAM: PORTABLE CHEST - 1 VIEW  COMPARISON:  Chest x-ray dated 03/07/2015 and chest CT dated 09/13/2014  FINDINGS: Pulmonary vascularity is slightly more prominent than on the prior study. Heart size remains normal. Bilateral pleural effusions processed, slightly more prominent. No discrete consolidated infiltrates.  IMPRESSION: Slightly increased bilateral pleural effusions with new pulmonary vascular congestion.   Electronically Signed   By: Lorriane Shire M.D.   On: 03/08/2015 15:59    ASSESSMENT / PLAN:  SIRS/Sepsis c/b adrenal insufficiency  W/ persistent hypotension vs shock. Source is not clear. ? Viral URI.  She is chronically on Midodrine w/ baseline BP 90s. She is better today. Not sure if this is d/t the midodrine adjustment OR the solucortef (possibly both) Plan Goal SBP >  80 Cont Midodrine q8 No Demadex today  Stress dose steroids; can begin taper in am 9/16, would taper over 10d to off (or what ever dose she is usually on) F/u cultures; would consider narrowing abx   Pleural effusion and mild pulmonary edema Tolerated well symptomatically  Plan Watch pulse ox O2 as needed Mobilize demadex 9/16   Anasarca: daughter concerned about this.  Plan Would not treat at this point Could consider resuming Demadex on 9/16  Anemia of chronic disease Anticipate hgb will drop d/t dilutional effect Plan Cont [place] [facility]  Looks better. We  will s/o  Erick Colace ACNP-BC [place] Pager # [phone] OR # [phone] if no answer   03/09/2015, 9:37 AM

## 2015-03-09 NOTE — Plan of Care (Signed)
Problem: Phase II Progression Outcomes Goal: Progress activity as tolerated unless otherwise ordered Outcome: Adequate for Discharge Pt ambulated 3 laps around unit (approx 1500 ft) w/ no assistive devices. VSS, no O2 required. Pt denies dizziness or SOB w/ exertion.

## 2015-03-10 DIAGNOSIS — R579 Shock, unspecified: Secondary | ICD-10-CM

## 2015-03-10 LAB — CBC
HEMATOCRIT: 29.8 % — AB (ref 36.0–46.0)
HEMOGLOBIN: 9.8 g/dL — AB (ref 12.0–15.0)
MCH: 27.8 pg (ref 26.0–34.0)
MCHC: 32.9 g/dL (ref 30.0–36.0)
MCV: 84.4 fL (ref 78.0–100.0)
Platelets: 387 10*3/uL (ref 150–400)
RBC: 3.53 MIL/uL — ABNORMAL LOW (ref 3.87–5.11)
RDW: 19.1 % — AB (ref 11.5–15.5)
WBC: 10.4 10*3/uL (ref 4.0–10.5)

## 2015-03-10 LAB — BASIC METABOLIC PANEL
Anion gap: 3 — ABNORMAL LOW (ref 5–15)
BUN: 14 mg/dL (ref 6–20)
CHLORIDE: 110 mmol/L (ref 101–111)
CO2: 28 mmol/L (ref 22–32)
Calcium: 8 mg/dL — ABNORMAL LOW (ref 8.9–10.3)
Creatinine, Ser: 0.76 mg/dL (ref 0.44–1.00)
GFR calc Af Amer: >60 mL/min (ref >60)
GLUCOSE: 128 mg/dL — AB (ref 65–99)
Potassium: 4.9 mmol/L (ref 3.5–5.1)
Sodium: 141 mmol/L (ref 135–145)

## 2015-03-10 MED ORDER — FUROSEMIDE 10 MG/ML IJ SOLN
20.0000 mg | Freq: Once | INTRAMUSCULAR | Status: AC
  Administered 2015-03-10: 20 mg via INTRAVENOUS
  Filled 2015-03-10: qty 2

## 2015-03-10 MED ORDER — LEVOFLOXACIN 500 MG PO TABS
500.0000 mg | ORAL_TABLET | Freq: Every day | ORAL | Status: DC
  Administered 2015-03-10 – 2015-03-13 (×4): 500 mg via ORAL
  Filled 2015-03-10 (×4): qty 1

## 2015-03-10 NOTE — Progress Notes (Addendum)
Patient ID: Anna Calderon, female   DOB: Sep 17, 1960, 54 y.o.   MRN: [id number]  [facility] PROGRESS NOTE  MORIYAH BYINGTON XQJ:[id number] DOB: [birth year] DOA: 03/07/2015 PCP: Pcp Not In System   Brief narrative:    54 y.o. 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  In ED, patient had T101.45F, SBP in 80s, she was given 2 L of normal saline and sepsis workup was initiated. 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, appreciate his input  - pt is currently under chemotherapy with Velcade, dexamethasone and Cytoxan - these are given on a weekly basis and treatment will be withheld this week due to acute illness     Acute hypokalemia  - with underlying hypomagnesemia - both electrolytes supplemented and WNL this AM     Acute hyponatremia - IVF have been provided and Na is now WNL     Anasarca - in the setting of severe hypoalbuminemia and PCM, amyloid kidneys and proteinuria  - holding Lasix for now, holding IVF as well  - weight up to 150 lbs from 136 on admission - stop all IVF and give Lasix 20 mg IV x 1 dose     Anemia of malignancy - Hg remains ~9 - 10, no signs of bleeding - repeat CBC in AM    Moderate PCM - in the setting of chronic illness, severe hypoalbuminemia  - nutritionist consulted    DVT prophylaxis - Lovenox SQ  Code Status: Full.  Family Communication:  plan of care discussed with the patient and family at bedside  Disposition Plan: Transfer to medical bed  IV access:  Peripheral IV  Procedures and diagnostic studies:    Dg Chest 2 View 03/07/2015   Small bilateral pleural effusions with adjacent atelectasis at the lung bases. No consolidation to suggest pneumonia.    Medical Consultants:  PCCM Oncology   Other Consultants:  None  IAnti-Infectives:   Zosyn 9/13 --> 9/16 Vanc 9/13 --> 9/16 Levaquin 9/16 -->  Faye Ramsay, MD  [facility] Pager [phone]  If 7PM-7AM, please contact night-coverage [email or web address] Password [facility] 03/10/2015, 7:46 AM   LOS: 2 days   HPI/Subjective: No events overnight. Reports feeling better.  Objective: Filed Vitals:   03/09/15 2000 03/10/15 0000 03/10/15 0400 03/10/15 0741  BP: 125/66 104/73 98/58 105/70  Pulse: 88 81 74 83  Temp: 97.7 F (36.5 C) 98 F (36.7 C) 97.6 F (36.4 C)   TempSrc: Oral Oral Oral   Resp: '24 24 18 17  ' Height:      Weight:   68.2 kg (150 lb 5.7 oz)   SpO2: 95% 95% 93% 94%    Intake/Output Summary (Last 24 hours) at 03/10/15 0746  Last data filed at 03/10/15 4315  Gross per 24 hour  Intake   2050 ml  Output    500 ml  Net   1550 ml    Exam:   General:  Pt is alert, follows commands appropriately, not in acute distress  Cardiovascular: Regular rate and rhythm, S1/S2, no murmurs, no rubs, no gallops  Respiratory: Clear to auscultation bilaterally, no wheezing, no crackles, no rhonchi  Abdomen: Soft, non tender, slightly distended, bowel sounds present, no guarding  Extremities: +2 bilateral LE pitting edema, bilateral UE edema, pulses DP and PT palpable bilaterally  Neuro: Grossly nonfocal  Data Reviewed: Basic Metabolic Panel:  Recent Labs Lab 03/07/15 2008 03/08/15 0340 03/09/15 0336 03/10/15 0405  NA 133* 137 137 141  K 3.3* 2.7* 4.4 4.9  CL 100* 103 106 110   CO2 '28 29 26 28  ' GLUCOSE 101* 92 120* 128*  BUN '10 10 12 14  ' CREATININE 0.60 0.62 0.59 0.76  CALCIUM 7.3* 6.9* 7.6* 8.0*  MG  --  1.5* 1.9  --    Liver Function Tests:  Recent Labs Lab 03/07/15 2008  AST 23  ALT 13*  ALKPHOS 66  BILITOT 0.8  PROT 3.3*  ALBUMIN 1.2*   CBC:  Recent Labs Lab 03/07/15 2008 03/08/15 0340 03/09/15 0336 03/10/15 0405  WBC 13.0* 10.2 8.4 10.4  NEUTROABS 11.8*  --   --   --   HGB 9.9* 9.6* 10.0* 9.8*  HCT 28.7* 27.4* 29.8* 29.8*  MCV 83.7 84.8 83.9 84.4  PLT 385 269 327 387    Recent Results (from the past 240 hour(s))  Culture, blood (routine x 2)     Status: None (Preliminary result)   Collection Time: 03/07/15  8:10 PM  Result Value Ref Range Status   Specimen Description BLOOD RIGHT WRIST  Final   Special Requests BOTTLES DRAWN AEROBIC AND ANAEROBIC 10ML  Final   Culture   Final    NO GROWTH 2 DAYS Performed at [facility]    Report Status PENDING  Incomplete  Culture, blood (routine x 2)     Status: None (Preliminary result)   Collection Time: 03/07/15  8:25 PM  Result Value Ref Range Status   Specimen Description BLOOD RIGHT ARM  Final   Special Requests BOTTLES DRAWN AEROBIC AND ANAEROBIC 10ML  Final   Culture   Final    NO GROWTH 2 DAYS Performed at [facility]    Report Status PENDING  Incomplete  Urine culture     Status: None   Collection Time: 03/07/15  9:12 PM  Result Value Ref Range Status   Specimen Description URINE, CLEAN CATCH  Final   Special Requests NONE  Final   Culture   Final    MULTIPLE SPECIES PRESENT, SUGGEST RECOLLECTION Performed at [facility]    Report Status 03/09/2015 FINAL  Final  MRSA PCR Screening     Status: None   Collection Time: 03/08/15  1:40 AM  Result Value Ref Range Status   MRSA by PCR NEGATIVE NEGATIVE Final    Comment:        The GeneXpert MRSA Assay (FDA approved for NASAL specimens only), is one component of a comprehensive MRSA  colonization surveillance program. It is not intended to diagnose MRSA infection nor to guide or monitor treatment for MRSA infections.      Scheduled Meds: . acyclovir  400 mg Oral BID  . cholecalciferol  1,000 Units Oral Daily  .  enoxaparin (LOVENOX) injection  40 mg Subcutaneous QHS  . feeding supplement  1 Container Oral BID BM  . hydrocortisone sod succinate (SOLU-CORTEF) inj  50 mg Intravenous Q6H  . midodrine  5 mg Oral TID  . piperacillin-tazobactam (ZOSYN)  IV  3.375 g Intravenous 3 times per day  . potassium chloride  20 mEq Oral BID  . vancomycin  750 mg Intravenous Q12H   Continuous Infusions: . lactated ringers 50 mL/hr at 03/10/15 [phone]

## 2015-03-11 LAB — CBC
HCT: 28 % — ABNORMAL LOW (ref 36.0–46.0)
HEMOGLOBIN: 9.1 g/dL — AB (ref 12.0–15.0)
MCH: 27.4 pg (ref 26.0–34.0)
MCHC: 32.5 g/dL (ref 30.0–36.0)
MCV: 84.3 fL (ref 78.0–100.0)
Platelets: 393 10*3/uL (ref 150–400)
RBC: 3.32 MIL/uL — AB (ref 3.87–5.11)
RDW: 19 % — ABNORMAL HIGH (ref 11.5–15.5)
WBC: 6.6 10*3/uL (ref 4.0–10.5)

## 2015-03-11 LAB — BASIC METABOLIC PANEL
Anion gap: 3 — ABNORMAL LOW (ref 5–15)
BUN: 18 mg/dL (ref 6–20)
CHLORIDE: 110 mmol/L (ref 101–111)
CO2: 27 mmol/L (ref 22–32)
CREATININE: 0.76 mg/dL (ref 0.44–1.00)
Calcium: 8.1 mg/dL — ABNORMAL LOW (ref 8.9–10.3)
GFR calc Af Amer: >60 mL/min (ref >60)
GFR calc non Af Amer: >60 mL/min (ref >60)
GLUCOSE: 117 mg/dL — AB (ref 65–99)
POTASSIUM: 4.4 mmol/L (ref 3.5–5.1)
SODIUM: 140 mmol/L (ref 135–145)

## 2015-03-11 MED ORDER — HYDROCORTISONE NA SUCCINATE PF 100 MG IJ SOLR
50.0000 mg | Freq: Every day | INTRAMUSCULAR | Status: DC
  Administered 2015-03-12: 50 mg via INTRAVENOUS
  Filled 2015-03-11: qty 1

## 2015-03-11 MED ORDER — FUROSEMIDE 10 MG/ML IJ SOLN
40.0000 mg | Freq: Once | INTRAMUSCULAR | Status: AC
  Administered 2015-03-11: 40 mg via INTRAVENOUS
  Filled 2015-03-11: qty 4

## 2015-03-11 NOTE — Progress Notes (Signed)
Patient ID: Anna Calderon, female   DOB: 16-Nov-1960, 54 y.o.   MRN: [id number]  [facility] PROGRESS NOTE  Anna Calderon KFM:[id number] DOB: 10-Aug-1960 DOA: 03/07/2015 PCP: Pcp Not In System   Brief narrative:    54 y.o. 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  In ED, patient had T101.16F, SBP in 80s, she was given 2 L of normal saline and sepsis workup was initiated. 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, appreciate his input  - pt is currently under chemotherapy with Velcade, dexamethasone and Cytoxan - these are given on a weekly basis and treatment will be withheld this week due to acute illness  - outpatient follow up    Acute hypokalemia  - with underlying hypomagnesemia - both electrolytes supplemented and remain WNL    Acute hyponatremia - IVF have been provided and Na remains stable     Anasarca - in the setting of severe hypoalbuminemia and PCM, amyloid kidneys and proteinuria  - weight up to 150 lbs from 136 on admission, now down to 145 lbs - give Lasix 40 mg IV now - monitor daily weights     Anemia of malignancy - Hg remains ~9 - 10, no signs of bleeding    Moderate PCM - in the setting of chronic illness, severe hypoalbuminemia  - nutritionist consulted   DVT prophylaxis - Lovenox SQ  Code Status: Full.  Family Communication:  plan of care discussed with the patient and  family at bedside  Disposition Plan: D/C home in AM  IV access:  Peripheral IV  Procedures and diagnostic studies:    Dg Chest 2 View 03/07/2015   Small bilateral pleural effusions with adjacent atelectasis at the lung bases. No consolidation to suggest pneumonia.    Medical Consultants:  [facility]   Other Consultants:  None  IAnti-Infectives:   Zosyn 9/13 --> 9/16 Vanc 9/13 --> 9/16 Levaquin 9/16 -->  Faye Ramsay, MD  [facility] Pager [phone]  If 7PM-7AM, please contact night-coverage [email or web address] Password [facility] 03/11/2015, 2:33 PM   LOS: 3 days   HPI/Subjective: No events overnight. Reports feeling better.  Objective: Filed Vitals:   03/11/15 0937 03/11/15 1000 03/11/15 1241 03/11/15 1400  BP: 104/62 110/67 117/79   Pulse:  95 94   Temp:  98.2 F (36.8 C)  98 F (36.7 C)  TempSrc:  Oral    Resp:  19    Height:      Weight:      SpO2:  99%      Intake/Output Summary (Last 24 hours) at 03/11/15 1433 Last data filed at 03/11/15 1000  Gross per 24 hour  Intake      0 ml  Output   1800 ml  Net  -1800 ml    Exam:   General:  Pt is  alert, follows commands appropriately, not in acute distress  Cardiovascular: Regular rate and rhythm, S1/S2, no murmurs, no rubs, no gallops  Respiratory: Clear to auscultation bilaterally, no wheezing, no crackles, no rhonchi  Abdomen: Soft, non tender, slightly distended, bowel sounds present, no guarding  Extremities: +2 bilateral LE pitting edema, bilateral UE edema, pulses DP and PT palpable bilaterally  Neuro: Grossly nonfocal  Data Reviewed: Basic Metabolic Panel:  Recent Labs Lab 03/07/15 2008 03/08/15 0340 03/09/15 0336 03/10/15 0405 03/11/15 0426  NA 133* 137 137 141 140  K 3.3* 2.7* 4.4 4.9 4.4  CL 100* 103 106 110 110  CO2 '28 29 26 28 27  ' GLUCOSE 101* 92 120* 128* 117*  BUN '10 10 12 14 18  ' CREATININE 0.60 0.62 0.59 0.76 0.76  CALCIUM 7.3* 6.9* 7.6* 8.0* 8.1*  MG  --  1.5* 1.9  --   --     Liver Function Tests:  Recent Labs Lab 03/07/15 2008  AST 23  ALT 13*  ALKPHOS 66  BILITOT 0.8  PROT 3.3*  ALBUMIN 1.2*   CBC:  Recent Labs Lab 03/07/15 2008 03/08/15 0340 03/09/15 0336 03/10/15 0405 03/11/15 0426  WBC 13.0* 10.2 8.4 10.4 6.6  NEUTROABS 11.8*  --   --   --   --   HGB 9.9* 9.6* 10.0* 9.8* 9.1*  HCT 28.7* 27.4* 29.8* 29.8* 28.0*  MCV 83.7 84.8 83.9 84.4 84.3  PLT 385 269 327 387 393    Recent Results (from the past 240 hour(s))  Culture, blood (routine x 2)     Status: None (Preliminary result)   Collection Time: 03/07/15  8:10 PM  Result Value Ref Range Status   Specimen Description BLOOD RIGHT WRIST  Final   Special Requests BOTTLES DRAWN AEROBIC AND ANAEROBIC 10ML  Final   Culture   Final    NO GROWTH 4 DAYS Performed at [facility]    Report Status PENDING  Incomplete  Culture, blood (routine x 2)     Status: None (Preliminary result)   Collection Time: 03/07/15  8:25 PM  Result Value Ref Range Status   Specimen Description BLOOD RIGHT ARM  Final   Special Requests BOTTLES DRAWN AEROBIC AND ANAEROBIC 10ML  Final   Culture   Final    NO GROWTH 4 DAYS Performed at [facility]    Report Status PENDING  Incomplete  Urine culture     Status: None   Collection Time: 03/07/15  9:12 PM  Result Value Ref Range Status   Specimen Description URINE, CLEAN CATCH  Final   Special Requests NONE  Final   Culture   Final    MULTIPLE SPECIES PRESENT, SUGGEST RECOLLECTION Performed at [facility]    Report Status 03/09/2015 FINAL  Final  MRSA PCR Screening     Status: None   Collection Time: 03/08/15  1:40 AM  Result Value Ref Range Status   MRSA by PCR NEGATIVE NEGATIVE Final    Comment:        The GeneXpert MRSA Assay (FDA approved for NASAL specimens only), is one component of a comprehensive MRSA colonization surveillance program. It is not intended to diagnose MRSA infection nor to guide or monitor treatment  for MRSA infections.      Scheduled Meds: . acyclovir  400 mg Oral BID  . cholecalciferol  1,000 Units Oral Daily  . enoxaparin (LOVENOX) injection  40 mg Subcutaneous QHS  . feeding supplement  1 Container Oral BID  BM  . [START ON 03/12/2015] hydrocortisone sod succinate (SOLU-CORTEF) inj  50 mg Intravenous Daily  . levofloxacin  500 mg Oral Daily  . midodrine  5 mg Oral TID  . potassium chloride  20 mEq Oral BID   Continuous Infusions:         b

## 2015-03-12 DIAGNOSIS — E871 Hypo-osmolality and hyponatremia: Secondary | ICD-10-CM

## 2015-03-12 DIAGNOSIS — R6520 Severe sepsis without septic shock: Secondary | ICD-10-CM

## 2015-03-12 DIAGNOSIS — D638 Anemia in other chronic diseases classified elsewhere: Secondary | ICD-10-CM | POA: Diagnosis present

## 2015-03-12 LAB — CBC
HEMATOCRIT: 28.6 % — AB (ref 36.0–46.0)
HEMOGLOBIN: 9.2 g/dL — AB (ref 12.0–15.0)
MCH: 27.4 pg (ref 26.0–34.0)
MCHC: 32.2 g/dL (ref 30.0–36.0)
MCV: 85.1 fL (ref 78.0–100.0)
Platelets: 380 10*3/uL (ref 150–400)
RBC: 3.36 MIL/uL — AB (ref 3.87–5.11)
RDW: 18.9 % — ABNORMAL HIGH (ref 11.5–15.5)
WBC: 6.2 10*3/uL (ref 4.0–10.5)

## 2015-03-12 LAB — CULTURE, BLOOD (ROUTINE X 2)
CULTURE: NO GROWTH
CULTURE: NO GROWTH

## 2015-03-12 LAB — BASIC METABOLIC PANEL
ANION GAP: 5 (ref 5–15)
BUN: 21 mg/dL — ABNORMAL HIGH (ref 6–20)
CHLORIDE: 109 mmol/L (ref 101–111)
CO2: 28 mmol/L (ref 22–32)
Calcium: 7.9 mg/dL — ABNORMAL LOW (ref 8.9–10.3)
Creatinine, Ser: 0.82 mg/dL (ref 0.44–1.00)
GFR calc Af Amer: >60 mL/min (ref >60)
GLUCOSE: 84 mg/dL (ref 65–99)
POTASSIUM: 3.7 mmol/L (ref 3.5–5.1)
Sodium: 142 mmol/L (ref 135–145)

## 2015-03-12 MED ORDER — FUROSEMIDE 10 MG/ML IJ SOLN
40.0000 mg | Freq: Once | INTRAMUSCULAR | Status: AC
  Administered 2015-03-12: 40 mg via INTRAVENOUS
  Filled 2015-03-12: qty 4

## 2015-03-12 MED ORDER — SENNA 8.6 MG PO TABS: 1.0000 | ORAL_TABLET | Freq: Two times a day (BID) | ORAL | Status: DC

## 2015-03-12 NOTE — Progress Notes (Signed)
Patient ID: Anna Calderon, female   DOB: Sep 11, 1960, 53 y.o.   MRN: [id number]  [facility] PROGRESS NOTE  AI SONNENFELD TDH:[id number] DOB: 01-20-1961 DOA: 03/07/2015 PCP: Pcp Not In System   Brief narrative:    54 y.o. 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  In ED, patient had T101.60F, SBP in 80s, she was given 2 L of normal saline and sepsis workup was initiated. 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, appreciate his input  - pt is currently under chemotherapy with Velcade, dexamethasone and Cytoxan - these are given on a weekly basis and treatment will be withheld this week due to acute illness  - outpatient follow up will be arranged prior to discharge     Acute hypokalemia  - with underlying hypomagnesemia - both electrolytes supplemented and remain WNL - reassess again prior to discharge     Acute hyponatremia - IVF have been provided and Na remains stable  - no further IVF to prevent volume overload - only encouraged PO intake     Anasarca - in the setting of severe hypoalbuminemia and PCM, amyloid kidneys and proteinuria  - weight up to 150 lbs from 136 on admission, now down to 144 lbs - give one more dose of Lasix 40 mg IV now - monitor daily weights     Anemia of malignancy - Hg remains ~9 - 10, no signs of bleeding    Moderate PCM - in the setting of chronic  illness, severe hypoalbuminemia  - nutritionist consulted   DVT prophylaxis - Lovenox SQ  Code Status: Full.  Family Communication:  plan of care discussed with the patient and family at bedside  Disposition Plan: D/C home in AM  IV access:  Peripheral IV  Procedures and diagnostic studies:    Dg Chest 2 View 03/07/2015   Small bilateral pleural effusions with adjacent atelectasis at the lung bases. No consolidation to suggest pneumonia.    Medical Consultants:  [facility]   Other Consultants:  None  IAnti-Infectives:   Zosyn 9/13 --> 9/16 Vanc 9/13 --> 9/16 Levaquin 9/16 -->  Faye Ramsay, MD  [facility] Pager [phone]  If 7PM-7AM, please contact night-coverage [email or web address] Password [facility] 03/12/2015, 10:37 AM   LOS: 4 days   HPI/Subjective: No events overnight. Reports feeling better.  Objective: Filed Vitals:   03/11/15 1400 03/11/15 2126 03/12/15 0631 03/12/15 0634  BP: 107/68 104/63 86/46 102/70  Pulse: 82 85 82   Temp: 98 F (36.7 C) 98.1 F (36.7 C) 98.1 F (36.7 C)   TempSrc: Oral Oral Oral   Resp: '17 16 16   ' Height:      Weight:    65.318 kg (144 lb)  SpO2: 94% 95% 95%     Intake/Output Summary (Last 24 hours) at 03/12/15 1037  Last data filed at 03/12/15 2330  Gross per 24 hour  Intake   1080 ml  Output    700 ml  Net    380 ml    Exam:   General:  Pt is alert, follows commands appropriately, not in acute distress  Cardiovascular: Regular rate and rhythm, S1/S2, no murmurs, no rubs, no gallops  Respiratory: Clear to auscultation bilaterally, no wheezing, no crackles, no rhonchi  Abdomen: Soft, non tender, slightly distended, bowel sounds present, no guarding  Extremities: +2 bilateral LE pitting edema, bilateral UE edema RUE > LUE, pulses DP and PT palpable bilaterally  Neuro: Grossly nonfocal  Data Reviewed: Basic Metabolic Panel:  Recent Labs Lab 03/08/15 0340 03/09/15 0336 03/10/15 0405 03/11/15 0426 03/12/15 0449   NA 137 137 141 140 142  K 2.7* 4.4 4.9 4.4 3.7  CL 103 106 110 110 109  CO2 '29 26 28 27 28  ' GLUCOSE 92 120* 128* 117* 84  BUN '10 12 14 18 ' 21*  CREATININE 0.62 0.59 0.76 0.76 0.82  CALCIUM 6.9* 7.6* 8.0* 8.1* 7.9*  MG 1.5* 1.9  --   --   --    Liver Function Tests:  Recent Labs Lab 03/07/15 2008  AST 23  ALT 13*  ALKPHOS 66  BILITOT 0.8  PROT 3.3*  ALBUMIN 1.2*   CBC:  Recent Labs Lab 03/07/15 2008 03/08/15 0340 03/09/15 0336 03/10/15 0405 03/11/15 0426 03/12/15 0449  WBC 13.0* 10.2 8.4 10.4 6.6 6.2  NEUTROABS 11.8*  --   --   --   --   --   HGB 9.9* 9.6* 10.0* 9.8* 9.1* 9.2*  HCT 28.7* 27.4* 29.8* 29.8* 28.0* 28.6*  MCV 83.7 84.8 83.9 84.4 84.3 85.1  PLT 385 269 327 387 393 380    Recent Results (from the past 240 hour(s))  Culture, blood (routine x 2)     Status: None (Preliminary result)   Collection Time: 03/07/15  8:10 PM  Result Value Ref Range Status   Specimen Description BLOOD RIGHT WRIST  Final   Special Requests BOTTLES DRAWN AEROBIC AND ANAEROBIC 10ML  Final   Culture   Final    NO GROWTH 4 DAYS Performed at [facility]    Report Status PENDING  Incomplete  Culture, blood (routine x 2)     Status: None (Preliminary result)   Collection Time: 03/07/15  8:25 PM  Result Value Ref Range Status   Specimen Description BLOOD RIGHT ARM  Final   Special Requests BOTTLES DRAWN AEROBIC AND ANAEROBIC 10ML  Final   Culture   Final    NO GROWTH 4 DAYS Performed at [facility]    Report Status PENDING  Incomplete  Urine culture     Status: None   Collection Time: 03/07/15  9:12 PM  Result Value Ref Range Status   Specimen Description URINE, CLEAN CATCH  Final   Special Requests NONE  Final   Culture   Final    MULTIPLE SPECIES PRESENT, SUGGEST RECOLLECTION Performed at [facility]    Report Status 03/09/2015 FINAL  Final  MRSA PCR Screening     Status: None   Collection Time: 03/08/15  1:40 AM  Result Value Ref Range  Status   MRSA by PCR NEGATIVE NEGATIVE Final    Comment:        The GeneXpert MRSA Assay (FDA approved for NASAL specimens only), is one component of a comprehensive MRSA colonization surveillance program. It is not intended  to diagnose MRSA infection nor to guide or monitor treatment for MRSA infections.      Scheduled Meds: . acyclovir  400 mg Oral BID  . cholecalciferol  1,000 Units Oral Daily  . enoxaparin (LOVENOX) injection  40 mg Subcutaneous QHS  . feeding supplement  1 Container Oral BID BM  . furosemide  40 mg Intravenous Once  . hydrocortisone sod succinate (SOLU-CORTEF) inj  50 mg Intravenous Daily  . levofloxacin  500 mg Oral Daily  . midodrine  5 mg Oral TID  . potassium chloride  20 mEq Oral BID   Continuous Infusions:         b

## 2015-03-13 ENCOUNTER — Other Ambulatory Visit: Payer: Self-pay | Admitting: *Deleted

## 2015-03-13 ENCOUNTER — Encounter: Payer: Self-pay | Admitting: *Deleted

## 2015-03-13 DIAGNOSIS — IMO0001 Reserved for inherently not codable concepts without codable children: Secondary | ICD-10-CM | POA: Insufficient documentation

## 2015-03-13 LAB — BASIC METABOLIC PANEL
Anion gap: 4 — ABNORMAL LOW (ref 5–15)
BUN: 18 mg/dL (ref 6–20)
CHLORIDE: 107 mmol/L (ref 101–111)
CO2: 29 mmol/L (ref 22–32)
Calcium: 7.8 mg/dL — ABNORMAL LOW (ref 8.9–10.3)
Creatinine, Ser: 0.68 mg/dL (ref 0.44–1.00)
GFR calc Af Amer: >60 mL/min (ref >60)
GLUCOSE: 89 mg/dL (ref 65–99)
POTASSIUM: 3.5 mmol/L (ref 3.5–5.1)
Sodium: 140 mmol/L (ref 135–145)

## 2015-03-13 LAB — CBC
HEMATOCRIT: 27.9 % — AB (ref 36.0–46.0)
Hemoglobin: 9.6 g/dL — ABNORMAL LOW (ref 12.0–15.0)
MCH: 29.4 pg (ref 26.0–34.0)
MCHC: 34.4 g/dL (ref 30.0–36.0)
MCV: 85.6 fL (ref 78.0–100.0)
PLATELETS: 366 10*3/uL (ref 150–400)
RBC: 3.26 MIL/uL — AB (ref 3.87–5.11)
RDW: 18.9 % — ABNORMAL HIGH (ref 11.5–15.5)
WBC: 8.2 10*3/uL (ref 4.0–10.5)

## 2015-03-13 MED ORDER — FUROSEMIDE 20 MG PO TABS: 20.0000 mg | ORAL_TABLET | Freq: Every day | ORAL | Status: DC | PRN

## 2015-03-13 MED ORDER — OXYCODONE HCL 5 MG PO TABS: 5.0000 mg | ORAL_TABLET | ORAL | Status: DC | PRN

## 2015-03-13 MED ORDER — LEVOFLOXACIN 500 MG PO TABS: 500.0000 mg | ORAL_TABLET | Freq: Every day | ORAL | Status: DC

## 2015-03-13 MED ORDER — CYCLOPHOSPHAMIDE 50 MG PO TABS: ORAL_TABLET | ORAL | Status: DC

## 2015-03-13 NOTE — Discharge Summary (Signed)
Physician Discharge Summary  Anna Calderon XOV:[id number] DOB: 1961/05/07 DOA: 03/07/2015  PCP: Pcp Not In System  Admit date: 03/07/2015 Discharge date: 03/13/2015  Recommendations for Outpatient Follow-up:  1. Pt will need to follow up with PCP in 2-3 weeks post discharge 2. Please obtain BMP to evaluate electrolytes and kidney function 3. Please also check CBC to evaluate Hg and Hct levels 4. Weight on d/c 141 lbs 5. Lasix 20 mg daily 6. Stop torsemide   Discharge Diagnoses:  Principal Problem:   Severe sepsis with septic shock Active Problems:   AL amyloidosis   Acute hyponatremia   Anemia of chronic disease, malignancy    Hypoalbuminemia   Anasarca   Hypokalemia  Discharge Condition: Stable  Diet recommendation: Heart healthy diet discussed in details    Brief narrative:    54 y.o. 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  In ED, patient had T101.35F, SBP in 80s, she was given 2 L of normal saline and sepsis workup was initiated. 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, appreciate his input  - pt is currently under chemotherapy with Velcade, dexamethasone and Cytoxan - these are given on a weekly basis and treatment will be withheld this week due to acute illness  - outpatient follow up arranged   Acute hypokalemia  - with underlying hypomagnesemia -  both electrolytes supplemented and remain WNL   Acute hyponatremia - IVF have been provided and Na remained stable    Anasarca - in the setting of severe hypoalbuminemia and PCM, amyloid kidneys and proteinuria  - weight up to 150 lbs from 136 on admission, now down to 141 lbs - d/c on Lasix as pt seems to be responding better to Lasix than Torsemide    Anemia of malignancy - Hg remains ~9 - 10, no signs of bleeding   Moderate PCM - in the setting of chronic illness, severe hypoalbuminemia  - nutritionist consulted  - pt tolerating diet well   Code Status: Full.  Family Communication: plan of care discussed with the patient and family at bedside  Disposition Plan: D/C home   IV access:  Peripheral IV  Procedures and diagnostic studies:   Dg Chest 2 View 03/07/2015 Small bilateral pleural effusions with adjacent atelectasis at the lung bases. No consolidation to suggest pneumonia.   Medical Consultants:  [facility]M Oncology   Other Consultants:  None  IAnti-Infectives:   Zosyn 9/13 --> 9/16 Vanc 9/13 --> 9/16 Levaquin 9/16 -->       Discharge Exam: Filed Vitals:   03/13/15 0628  BP: 90/52  Pulse: 92  Temp: 98.3 F (36.8 C)  Resp: 16   Filed Vitals:   03/12/15 0634 03/12/15 1300 03/12/15 2123 03/13/15 0628  BP: 102/70 111/70 117/69 90/52  Pulse:  97 90 92  Temp:  98.2 F (36.8 C) 98.3 F (36.8 C) 98.3 F (36.8 C)  TempSrc:  Oral Oral Oral  Resp:  _0 Height:      Weight: 65.318 kg (144 lb)   64.275 kg (141 lb 11.2 oz)  SpO2:  98% 97% 96%    General: Pt is alert, follows commands appropriately, not in acute distress Cardiovascular: Regular rate and rhythm, no rubs, no gallops Respiratory: Clear to auscultation bilaterally, no wheezing, no crackles, no rhonchi Abdominal: Soft, non tender, non distended, bowel sounds +, no guarding Extremities: +2 bilateral LE edema, no cyanosis, pulses palpable bilaterally DP and  PT  Discharge Instructions     Medication List    STOP taking these medications        torsemide 20 MG tablet  Commonly known as:  DEMADEX      TAKE these medications        acyclovir 400 MG tablet  Commonly known as:  ZOVIRAX  TAKE 1 TABLET BY MOUTH TWICE DAILY     aspirin 325 MG tablet  Take 325 mg by mouth once.     cyclophosphamide 50 MG tablet  Commonly known as:  CYTOXAN  Take 9 tablets weekly on an empty stomach on the day of your chemotherapy.     dexamethasone 4 MG tablet  Commonly known as:  DECADRON  Take 5 tablets every week with chemotherapy.     furosemide 20 MG tablet  Commonly known as:  LASIX  Take 1 tablet (20 mg total) by mouth daily as needed for fluid or edema.     levofloxacin 500 MG tablet  Commonly known as:  LEVAQUIN  Take 1 tablet (500 mg total) by mouth daily.     midodrine 5 MG tablet  Commonly known as:  PROAMATINE  Take 5 mg by mouth 2 (two) times daily.     oxyCODONE 5 MG immediate release tablet  Commonly known as:  Oxy IR/ROXICODONE  Take 1 tablet (5 mg total) by mouth every 4 (four) hours as needed for moderate pain.     potassium chloride 10 MEQ tablet  Commonly known as:  K-DUR  Take 20 mEq by mouth 2 (two) times daily.     prochlorperazine 10 MG tablet  Commonly known as:  COMPAZINE  Take 1 tablet (10 mg total) by mouth every 6 (six) hours as needed for nausea or vomiting.     Vitamin D3 1000 UNITS Caps  Take 1 tablet by mouth daily.           Follow-up Information    Call Faye Ramsay, MD.   Specialty:  Internal Medicine   Why:  As needed call my cell phone [phone]   Contact information:   [address] [place] [address] [phone]        The results of significant diagnostics from this hospitalization (including imaging, microbiology, ancillary and laboratory) are listed below for reference.     Microbiology: Recent Results (from the past 240 hour(s))  Culture,  blood (routine x 2)     Status: None   Collection Time: 03/07/15  8:10 PM  Result Value Ref Range Status   Specimen Description BLOOD RIGHT WRIST  Final   Special Requests BOTTLES DRAWN AEROBIC AND ANAEROBIC 10ML  Final   Culture   Final    NO GROWTH 5 DAYS Performed at [facility]    Report Status 03/12/2015 FINAL  Final  Culture, blood (routine x 2)  Status: None   Collection Time: 03/07/15  8:25 PM  Result Value Ref Range Status   Specimen Description BLOOD RIGHT ARM  Final   Special Requests BOTTLES DRAWN AEROBIC AND ANAEROBIC 10ML  Final   Culture   Final    NO GROWTH 5 DAYS Performed at [facility]    Report Status 03/12/2015 FINAL  Final  Urine culture     Status: None   Collection Time: 03/07/15  9:12 PM  Result Value Ref Range Status   Specimen Description URINE, CLEAN CATCH  Final   Special Requests NONE  Final   Culture   Final    MULTIPLE SPECIES PRESENT, SUGGEST RECOLLECTION Performed at [facility]    Report Status 03/09/2015 FINAL  Final  MRSA PCR Screening     Status: None   Collection Time: 03/08/15  1:40 AM  Result Value Ref Range Status   MRSA by PCR NEGATIVE NEGATIVE Final    Comment:        The GeneXpert MRSA Assay (FDA approved for NASAL specimens only), is one component of a comprehensive MRSA colonization surveillance program. It is not intended to diagnose MRSA infection nor to guide or monitor treatment for MRSA infections.      Labs: Basic Metabolic Panel:  Recent Labs Lab 03/08/15 0340 03/09/15 0336 03/10/15 0405 03/11/15 0426 03/12/15 0449 03/13/15 0531  NA 137 137 141 140 142 140  K 2.7* 4.4 4.9 4.4 3.7 3.5  CL 103 106 110 110 109 107  CO2 _0 GLUCOSE 92 120* 128* 117* 84 89  BUN _1 21* 18  CREATININE 0.62 0.59 0.76 0.76 0.82 0.68  CALCIUM 6.9* 7.6* 8.0* 8.1* 7.9* 7.8*  MG 1.5* 1.9  --   --   --   --    Liver Function Tests:  Recent Labs Lab 03/07/15 2008  AST 23   ALT 13*  ALKPHOS 66  BILITOT 0.8  PROT 3.3*  ALBUMIN 1.2*   CBC:  Recent Labs Lab 03/07/15 2008  03/09/15 0336 03/10/15 0405 03/11/15 0426 03/12/15 0449 03/13/15 0531  WBC 13.0*  < > 8.4 10.4 6.6 6.2 8.2  NEUTROABS 11.8*  --   --   --   --   --   --   HGB 9.9*  < > 10.0* 9.8* 9.1* 9.2* 9.6*  HCT 28.7*  < > 29.8* 29.8* 28.0* 28.6* 27.9*  MCV 83.7  < > 83.9 84.4 84.3 85.1 85.6  PLT 385  < > 327 387 393 380 366  < > = values in this interval not displayed.  SIGNED: Time coordinating discharge:  30 minutes  Faye Ramsay, MD  [facility] 03/13/2015, 8:34 AM Pager [phone]  If 7PM-7AM, please contact night-coverage [email or web address] Password [email or web address]

## 2015-03-13 NOTE — Discharge Instructions (Signed)

## 2015-03-14 ENCOUNTER — Encounter: Payer: Self-pay | Admitting: *Deleted

## 2015-03-16 ENCOUNTER — Ambulatory Visit (HOSPITAL_BASED_OUTPATIENT_CLINIC_OR_DEPARTMENT_OTHER): Payer: 59

## 2015-03-16 ENCOUNTER — Other Ambulatory Visit (HOSPITAL_BASED_OUTPATIENT_CLINIC_OR_DEPARTMENT_OTHER): Payer: 59

## 2015-03-16 VITALS — BP 112/65 | HR 88 | Temp 98.1°F | Resp 24

## 2015-03-16 DIAGNOSIS — E858 Other amyloidosis: Secondary | ICD-10-CM | POA: Diagnosis not present

## 2015-03-16 DIAGNOSIS — E8581 Light chain (AL) amyloidosis: Secondary | ICD-10-CM

## 2015-03-16 LAB — COMPREHENSIVE METABOLIC PANEL (CC13)
ALBUMIN: 1 g/dL — AB (ref 3.5–5.0)
ALT: 15 U/L (ref 0–55)
AST: 23 U/L (ref 5–34)
Alkaline Phosphatase: 75 U/L (ref 40–150)
Anion Gap: 3 mEq/L (ref 3–11)
BUN: 9.1 mg/dL (ref 7.0–26.0)
CO2: 29 meq/L (ref 22–29)
CREATININE: 0.6 mg/dL (ref 0.6–1.1)
Calcium: 7.6 mg/dL — ABNORMAL LOW (ref 8.4–10.4)
Chloride: 107 mEq/L (ref 98–109)
EGFR: >90 mL/min/{1.73_m2} (ref >90)
GLUCOSE: 84 mg/dL (ref 70–140)
Potassium: 3.9 mEq/L (ref 3.5–5.1)
SODIUM: 139 meq/L (ref 136–145)
TOTAL PROTEIN: 3.7 g/dL — AB (ref 6.4–8.3)

## 2015-03-16 LAB — CBC WITH DIFFERENTIAL/PLATELET
BASO%: 0.2 % (ref 0.0–2.0)
Basophils Absolute: 0 10*3/uL (ref 0.0–0.1)
EOS%: 1 % (ref 0.0–7.0)
Eosinophils Absolute: 0.1 10*3/uL (ref 0.0–0.5)
HEMATOCRIT: 28.2 % — AB (ref 34.8–46.6)
HEMOGLOBIN: 9.4 g/dL — AB (ref 11.6–15.9)
LYMPH#: 0.8 10*3/uL — AB (ref 0.9–3.3)
LYMPH%: 8.8 % — ABNORMAL LOW (ref 14.0–49.7)
MCH: 28.6 pg (ref 25.1–34.0)
MCHC: 33.3 g/dL (ref 31.5–36.0)
MCV: 85.7 fL (ref 79.5–101.0)
MONO#: 0.9 10*3/uL (ref 0.1–0.9)
MONO%: 10.2 % (ref 0.0–14.0)
NEUT#: 7 10*3/uL — ABNORMAL HIGH (ref 1.5–6.5)
NEUT%: 79.8 % — ABNORMAL HIGH (ref 38.4–76.8)
PLATELETS: 355 10*3/uL (ref 145–400)
RBC: 3.29 10*6/uL — ABNORMAL LOW (ref 3.70–5.45)
RDW: 19 % — AB (ref 11.2–14.5)
WBC: 8.7 10*3/uL (ref 3.9–10.3)

## 2015-03-16 MED ORDER — ONDANSETRON HCL 8 MG PO TABS
8.0000 mg | ORAL_TABLET | Freq: Once | ORAL | Status: AC
  Administered 2015-03-16: 8 mg via ORAL

## 2015-03-16 MED ORDER — ONDANSETRON HCL 8 MG PO TABS
ORAL_TABLET | ORAL | Status: AC
  Filled 2015-03-16: qty 1

## 2015-03-16 MED ORDER — BORTEZOMIB CHEMO SQ INJECTION 3.5 MG (2.5MG/ML)
1.5000 mg/m2 | Freq: Once | INTRAMUSCULAR | Status: AC
  Administered 2015-03-16: 2.25 mg via SUBCUTANEOUS
  Filled 2015-03-16: qty 2.25

## 2015-03-16 NOTE — Progress Notes (Signed)
Dr. Alen Blew came to chair to see pt. Per MD okay to treat.

## 2015-03-16 NOTE — Patient Instructions (Signed)
[facility] Discharge Instructions for Patients Receiving Chemotherapy  Today you received the following chemotherapy agents velcade.  To help prevent nausea and vomiting after your treatment, we encourage you to take your nausea medication .   If you develop nausea and vomiting that is not controlled by your nausea medication, call the clinic.   BELOW ARE SYMPTOMS THAT SHOULD BE REPORTED IMMEDIATELY:  *FEVER GREATER THAN 100.5 F  *CHILLS WITH OR WITHOUT FEVER  NAUSEA AND VOMITING THAT IS NOT CONTROLLED WITH YOUR NAUSEA MEDICATION  *UNUSUAL SHORTNESS OF BREATH  *UNUSUAL BRUISING OR BLEEDING  TENDERNESS IN MOUTH AND THROAT WITH OR WITHOUT PRESENCE OF ULCERS  *URINARY PROBLEMS  *BOWEL PROBLEMS  UNUSUAL RASH Items with * indicate a potential emergency and should be followed up as soon as possible.  Feel free to call the clinic you have any questions or concerns. The clinic phone number is [phone].  Please show the CHEMO ALERT CARD at check-in to the Emergency Department and triage nurse.   

## 2015-03-20 LAB — KAPPA/LAMBDA LIGHT CHAINS
KAPPA FREE LGHT CHN: 1.12 mg/dL (ref 0.33–1.94)
Kappa:Lambda Ratio: 2.55 — ABNORMAL HIGH (ref 0.26–1.65)
Lambda Free Lght Chn: 0.44 mg/dL — ABNORMAL LOW (ref 0.57–2.63)

## 2015-03-20 LAB — SPEP & IFE WITH QIG
Albumin ELP: 1.4 g/dL — ABNORMAL LOW (ref 3.8–4.8)
Alpha-1-Globulin: 0.2 g/dL (ref 0.2–0.3)
Alpha-2-Globulin: 1 g/dL — ABNORMAL HIGH (ref 0.5–0.9)
BETA GLOBULIN: 0.1 g/dL — AB (ref 0.4–0.6)
Beta 2: 0.2 g/dL (ref 0.2–0.5)
Gamma Globulin: 0.1 g/dL — ABNORMAL LOW (ref 0.8–1.7)
IGA: 35 mg/dL — AB (ref 69–380)
IGG (IMMUNOGLOBIN G), SERUM: 110 mg/dL — AB (ref 690–1700)
IgM, Serum: 14 mg/dL — ABNORMAL LOW (ref 52–322)
TOTAL PROTEIN, SERUM ELECTROPHOR: 3 g/dL — AB (ref 6.1–8.1)

## 2015-03-21 DIAGNOSIS — R579 Shock, unspecified: Secondary | ICD-10-CM | POA: Insufficient documentation

## 2015-03-23 ENCOUNTER — Ambulatory Visit (HOSPITAL_BASED_OUTPATIENT_CLINIC_OR_DEPARTMENT_OTHER): Payer: 59

## 2015-03-23 ENCOUNTER — Other Ambulatory Visit: Payer: Self-pay | Admitting: Oncology

## 2015-03-23 ENCOUNTER — Other Ambulatory Visit (HOSPITAL_BASED_OUTPATIENT_CLINIC_OR_DEPARTMENT_OTHER): Payer: 59

## 2015-03-23 ENCOUNTER — Ambulatory Visit (HOSPITAL_BASED_OUTPATIENT_CLINIC_OR_DEPARTMENT_OTHER): Payer: 59 | Admitting: Oncology

## 2015-03-23 ENCOUNTER — Telehealth: Payer: Self-pay | Admitting: Oncology

## 2015-03-23 VITALS — BP 110/66 | HR 93 | Temp 98.0°F | Resp 16 | Wt 133.4 lb

## 2015-03-23 VITALS — BP 115/71 | HR 92 | Temp 98.0°F | Resp 21

## 2015-03-23 DIAGNOSIS — E858 Other amyloidosis: Secondary | ICD-10-CM | POA: Diagnosis not present

## 2015-03-23 DIAGNOSIS — I959 Hypotension, unspecified: Secondary | ICD-10-CM

## 2015-03-23 DIAGNOSIS — E8581 Light chain (AL) amyloidosis: Secondary | ICD-10-CM

## 2015-03-23 LAB — CBC WITH DIFFERENTIAL/PLATELET
BASO%: 0.1 % (ref 0.0–2.0)
Basophils Absolute: 0 10*3/uL (ref 0.0–0.1)
EOS%: 0.7 % (ref 0.0–7.0)
Eosinophils Absolute: 0.1 10*3/uL (ref 0.0–0.5)
HEMATOCRIT: 28.8 % — AB (ref 34.8–46.6)
HGB: 9.5 g/dL — ABNORMAL LOW (ref 11.6–15.9)
LYMPH#: 0.8 10*3/uL — AB (ref 0.9–3.3)
LYMPH%: 6.6 % — ABNORMAL LOW (ref 14.0–49.7)
MCH: 28.1 pg (ref 25.1–34.0)
MCHC: 33 g/dL (ref 31.5–36.0)
MCV: 85.2 fL (ref 79.5–101.0)
MONO#: 0.8 10*3/uL (ref 0.1–0.9)
MONO%: 6.2 % (ref 0.0–14.0)
NEUT%: 86.4 % — AB (ref 38.4–76.8)
NEUTROS ABS: 10.8 10*3/uL — AB (ref 1.5–6.5)
Platelets: 383 10*3/uL (ref 145–400)
RBC: 3.38 10*6/uL — AB (ref 3.70–5.45)
RDW: 18.4 % — ABNORMAL HIGH (ref 11.2–14.5)
WBC: 12.6 10*3/uL — AB (ref 3.9–10.3)

## 2015-03-23 LAB — COMPREHENSIVE METABOLIC PANEL (CC13)
ALBUMIN: 0.9 g/dL — AB (ref 3.5–5.0)
ALK PHOS: 80 U/L (ref 40–150)
ALT: 10 U/L (ref 0–55)
ANION GAP: 5 meq/L (ref 3–11)
AST: 18 U/L (ref 5–34)
BUN: 10.3 mg/dL (ref 7.0–26.0)
CALCIUM: 7.9 mg/dL — AB (ref 8.4–10.4)
CO2: 26 mEq/L (ref 22–29)
Chloride: 108 mEq/L (ref 98–109)
Creatinine: 0.6 mg/dL (ref 0.6–1.1)
GLUCOSE: 88 mg/dL (ref 70–140)
POTASSIUM: 3.9 meq/L (ref 3.5–5.1)
SODIUM: 138 meq/L (ref 136–145)
TOTAL PROTEIN: 4 g/dL — AB (ref 6.4–8.3)

## 2015-03-23 MED ORDER — ONDANSETRON HCL 8 MG PO TABS
8.0000 mg | ORAL_TABLET | Freq: Once | ORAL | Status: AC
  Administered 2015-03-23: 8 mg via ORAL

## 2015-03-23 MED ORDER — BORTEZOMIB CHEMO SQ INJECTION 3.5 MG (2.5MG/ML)
1.5000 mg/m2 | Freq: Once | INTRAMUSCULAR | Status: AC
  Administered 2015-03-23: 2.25 mg via SUBCUTANEOUS
  Filled 2015-03-23: qty 2.25

## 2015-03-23 MED ORDER — ONDANSETRON HCL 8 MG PO TABS
ORAL_TABLET | ORAL | Status: AC
  Filled 2015-03-23: qty 1

## 2015-03-23 NOTE — Patient Instructions (Signed)
[facility] Discharge Instructions for Patients Receiving Chemotherapy  Today you received the following chemotherapy agents velcade.  To help prevent nausea and vomiting after your treatment, we encourage you to take your nausea medication .   If you develop nausea and vomiting that is not controlled by your nausea medication, call the clinic.   BELOW ARE SYMPTOMS THAT SHOULD BE REPORTED IMMEDIATELY:  *FEVER GREATER THAN 100.5 F  *CHILLS WITH OR WITHOUT FEVER  NAUSEA AND VOMITING THAT IS NOT CONTROLLED WITH YOUR NAUSEA MEDICATION  *UNUSUAL SHORTNESS OF BREATH  *UNUSUAL BRUISING OR BLEEDING  TENDERNESS IN MOUTH AND THROAT WITH OR WITHOUT PRESENCE OF ULCERS  *URINARY PROBLEMS  *BOWEL PROBLEMS  UNUSUAL RASH Items with * indicate a potential emergency and should be followed up as soon as possible.  Feel free to call the clinic you have any questions or concerns. The clinic phone number is [phone].  Please show the CHEMO ALERT CARD at check-in to the Emergency Department and triage nurse.   

## 2015-03-23 NOTE — Telephone Encounter (Signed)
Gave and printed appt sched and avs fo rpt for OCT and NOV  °

## 2015-03-23 NOTE — Progress Notes (Signed)
Hematology and Oncology Follow Up Visit  Anna Calderon [id number] 1961/04/24 54 y.o. 03/23/2015 8:49 AM Pcp Not In SystemNo ref. 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 Her disease is involving the bone marrow as well as the kidneys. She has potentially cardiac and neurological involvement.   Prior Therapy: She is status post kidney biopsy done on 08/10/2014 and showed positive staining for amyloidosis.  She is status post bone marrow biopsy on 09/20/2014 which showed 19% plasma cell infiltration suggesting plasma cell neoplasm.   Current therapy:   Velcade, Cytoxan and dexamethasone as follows:  Velcade at 1.5 mg/m subcutaneously on a weekly basis. Cyclophosphamide 300 mg/m (total dose of close to 450 mg) on a weekly basis. Dexamethasone at 20 mg by mouth (reduced from 40 mg because of her fluid retention) on a weekly basis.   Therapy started on 11/24/2014.  Interim History:   Anna Calderon presents today for a follow-up visit with her daughter. Since her last visit, she was hospitalized on 03/08/2015 for fevers, sepsis and hypotension. She was treated for presumed infection although cultures did not reveal any organism. She required ICU stay briefly and aggressive fluid resuscitation. She recovered quite nicely and was discharged on 03/13/2015. She missed her chemotherapy that week and her therapy was resumed on 03/16/2015. Since her discharge she is improving slowly. She does have generalized anasarca and currently on Lasix 40 mg daily. He is able to eat well without any issues. She has not reported any fevers or chills. Has not reported any dizziness or unsteadiness.   She has tolerated this treatment without any major complications. She does report some mild fatigue and anorexia associated with treatment. She has not reported any nausea or vomiting. She  does have a very mild diarrhea that is controlled by Imodium. She is followed by cardiology and nephrology at [facility] regarding her amyloid condition.  She does not report any headaches, blurry vision, syncope or seizures. She denied any chest pain or shortness of breath.  She did not report any easy bruisability or petechiae.  She does not report any fevers, chills, sweats, weight loss or abdominal discomfort. Did not report any nausea, vomiting. She does not report any urgency or hesitancy. She did not report any hematuria. She did not report any skeletal pain, lymphadenopathy or easy bruisability. The remaining review of systems unremarkable  Medications: I have reviewed the patient's current medications.  Current Outpatient Prescriptions  Medication Sig Dispense Refill  . acyclovir (ZOVIRAX) 400 MG tablet TAKE 1 TABLET BY MOUTH TWICE DAILY 60 tablet 0  . aspirin 325 MG tablet Take 325 mg by mouth once.    . Cholecalciferol (VITAMIN D3) 1000 UNITS CAPS Take 1 tablet by mouth daily.    . cyclophosphamide (CYTOXAN) 50 MG tablet Take 9 tablets weekly on an empty stomach on the day of your chemotherapy. 36 tablet 0  . dexamethasone (DECADRON) 4 MG tablet Take 5 tablets every week with chemotherapy. 60 tablet 3  . furosemide (LASIX) 20 MG tablet Take 1 tablet (20 mg total) by mouth daily as needed for fluid or edema. (Patient taking differently: Take 40 mg by mouth daily as needed for fluid or edema. ) 30 tablet 0  . levofloxacin (LEVAQUIN) 500 MG tablet Take 1 tablet (500 mg total) by mouth daily. 3 tablet 0  . midodrine (PROAMATINE) 5 MG tablet Take 5 mg  by mouth 2 (two) times daily.    Marland Kitchen oxyCODONE (OXY IR/ROXICODONE) 5 MG immediate release tablet Take 1 tablet (5 mg total) by mouth every 4 (four) hours as needed for moderate pain. 30 tablet 0  . potassium chloride (K-DUR) 10 MEQ tablet Take 20 mEq by mouth 2 (two) times daily.    . prochlorperazine (COMPAZINE) 10 MG tablet Take 1 tablet (10  mg total) by mouth every 6 (six) hours as needed for nausea or vomiting. 30 tablet 0   No current facility-administered medications for this visit.     Allergies: No Known Allergies  Past Medical History, Surgical history, Social history, and Family History were reviewed and updated.   Physical Exam: Her vitals personally reviewed today and showed a blood pressure of 110/66. Her heart rate is 93, respirations 16 temperature is 90.8 and saturation was 98% on room air.  ECOG: 1 General appearance: alert and cooperative pleasant woman without distress today. Head: Normocephalic, without obvious abnormality oral mucosa showed no ulcers or lesions. No thrush noted. Neck: no adenopathy Lymph nodes: Cervical, supraclavicular, and axillary nodes normal. Heart:regular rate and rhythm, S1, S2 normal, no murmur, click, rub or gallop Lung:chest clear, no wheezing, rales, normal symmetric air entry. No dullness to percussion. Abdomin: soft, non-tender, without masses or organomegaly.  Slight shifting dullness and ascites noted. EXT: Bilateral lower extremity edema noted. 2+ up to midshin.   Lab Results: Lab Results  Component Value Date   WBC 12.6* 03/23/2015   HGB 9.5* 03/23/2015   HCT 28.8* 03/23/2015   MCV 85.2 03/23/2015   PLT 383 03/23/2015     Chemistry      Component Value Date/Time   NA 139 03/16/2015 0956   NA 140 03/13/2015 0531   K 3.9 03/16/2015 0956   K 3.5 03/13/2015 0531   CL 107 03/13/2015 0531   CO2 29 03/16/2015 0956   CO2 29 03/13/2015 0531   BUN 9.1 03/16/2015 0956   BUN 18 03/13/2015 0531   CREATININE 0.6 03/16/2015 0956   CREATININE 0.68 03/13/2015 0531      Component Value Date/Time   CALCIUM 7.6* 03/16/2015 0956   CALCIUM 7.8* 03/13/2015 0531   ALKPHOS 75 03/16/2015 0956   ALKPHOS 66 03/07/2015 2008   AST 23 03/16/2015 0956   AST 23 03/07/2015 2008   ALT 15 03/16/2015 0956   ALT 13* 03/07/2015 2008   BILITOT <0.30 03/16/2015 0956   BILITOT 0.8  03/07/2015 2008       Results for Anna Calderon (MRN [id number]) as of 03/23/2015 08:40  Ref. Range 12/08/2014 13:30 01/19/2015 15:15 02/17/2015 13:09 03/16/2015 09:56  Kappa free light chain Latest Ref Range: 0.33-1.94 mg/dL 0.73 0.89 0.28 (L) 1.12  Lambda Free Lght Chn Latest Ref Range: 0.57-2.63 mg/dL 2.29 0.36 (L) 0.27 (L) 0.44 (L)  Kappa:Lambda Ratio Latest Ref Range: 0.26-1.65  0.32 2.47 (H) 1.04 2.55 (H)      Impression and Plan:   54 year old woman with the following issues:  1. AL amyloidosis: Presented with a plasma cell disorder manifesting itself with bone marrow involvement with 19% plasma cells that are lambda light chain specific. Her free lambda light chain in the serum are elevated at 29 with At the lambda ratio depleted is 0.05. Staging CT scan did not show any evidence of clear-cut other organ involvement. 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   She is currently receiving VCD without any major complications. Her protein studies from  03/16/2015 reviewed today reviewed today and continues to show normalization of her lambda free light chains. She has a inverted kappa to lambda ratio increase due to the substantial increase in her lambda light chains. She has not reported any major complications associated with this therapy.  The plan is to continue with the current dose and regimen and she will follow-up at [facility] regarding her amyloidosis. I anticipate that she will need to be on this regimen for 8-12 months depending on her remission status.   2. Generalized anasarca: She is currently on diuretics with good response. She is currently receiving Lasix 40 mg daily with potassium supplements. Her electrolytes from last week appear within normal range and her potassium will be checked again today.  3. Antiemetics: Continue Compazine as needed/prescribed. This appears to be less of an issue at this time and have not used many  antiemetics.   4. VZV prophylaxis: Continue acyclovir as prescribed. No signs or symptoms of infection.  5. Nutrition: I continue to emphasize the importance of increased protein intake which will component her generalized low albumen state.  6. Fever and sepsis: This have resolved at this time and she has finished a course of anti-biotics prophylactically.  7. Cardiac complications of amyloid: She is following up with cardiology at [facility] in the near future.  8. Follow-up: Will be in one week for the next treatment. I have scheduled her chemotherapy for the next month and a follow-up for clinical evaluation on 04/27/2015.  [facility], MD  9/29/20168:49 AM

## 2015-03-28 ENCOUNTER — Other Ambulatory Visit: Payer: Self-pay | Admitting: Oncology

## 2015-03-29 ENCOUNTER — Other Ambulatory Visit: Payer: Self-pay | Admitting: *Deleted

## 2015-03-29 ENCOUNTER — Encounter: Payer: Self-pay | Admitting: *Deleted

## 2015-03-29 MED ORDER — CYCLOPHOSPHAMIDE 50 MG PO TABS: ORAL_TABLET | ORAL | Status: DC

## 2015-03-31 ENCOUNTER — Ambulatory Visit (HOSPITAL_BASED_OUTPATIENT_CLINIC_OR_DEPARTMENT_OTHER): Payer: 59

## 2015-03-31 ENCOUNTER — Other Ambulatory Visit (HOSPITAL_BASED_OUTPATIENT_CLINIC_OR_DEPARTMENT_OTHER): Payer: 59

## 2015-03-31 VITALS — BP 110/61 | HR 91 | Temp 97.5°F | Resp 18 | Wt 128.5 lb

## 2015-03-31 DIAGNOSIS — Z5112 Encounter for antineoplastic immunotherapy: Secondary | ICD-10-CM | POA: Diagnosis not present

## 2015-03-31 DIAGNOSIS — E858 Other amyloidosis: Secondary | ICD-10-CM

## 2015-03-31 DIAGNOSIS — E8581 Light chain (AL) amyloidosis: Secondary | ICD-10-CM

## 2015-03-31 LAB — COMPREHENSIVE METABOLIC PANEL (CC13)
ALBUMIN: 0.9 g/dL — AB (ref 3.5–5.0)
ALK PHOS: 81 U/L (ref 40–150)
ALT: 10 U/L (ref 0–55)
ANION GAP: 2 meq/L — AB (ref 3–11)
AST: 16 U/L (ref 5–34)
BUN: 8 mg/dL (ref 7.0–26.0)
CO2: 27 meq/L (ref 22–29)
CREATININE: 0.6 mg/dL (ref 0.6–1.1)
Calcium: 7.9 mg/dL — ABNORMAL LOW (ref 8.4–10.4)
Chloride: 110 mEq/L — ABNORMAL HIGH (ref 98–109)
Glucose: 85 mg/dl (ref 70–140)
Potassium: 4.4 mEq/L (ref 3.5–5.1)
Sodium: 139 mEq/L (ref 136–145)
Total Protein: 3.9 g/dL — ABNORMAL LOW (ref 6.4–8.3)

## 2015-03-31 LAB — CBC WITH DIFFERENTIAL/PLATELET
BASO%: 0.5 % (ref 0.0–2.0)
BASOS ABS: 0 10*3/uL (ref 0.0–0.1)
EOS%: 1.6 % (ref 0.0–7.0)
Eosinophils Absolute: 0.1 10*3/uL (ref 0.0–0.5)
HEMATOCRIT: 27 % — AB (ref 34.8–46.6)
HEMOGLOBIN: 9 g/dL — AB (ref 11.6–15.9)
LYMPH#: 0.4 10*3/uL — AB (ref 0.9–3.3)
LYMPH%: 4.8 % — ABNORMAL LOW (ref 14.0–49.7)
MCH: 28.3 pg (ref 25.1–34.0)
MCHC: 33.1 g/dL (ref 31.5–36.0)
MCV: 85.5 fL (ref 79.5–101.0)
MONO#: 0.7 10*3/uL (ref 0.1–0.9)
MONO%: 8.3 % (ref 0.0–14.0)
NEUT#: 6.7 10*3/uL — ABNORMAL HIGH (ref 1.5–6.5)
NEUT%: 84.8 % — AB (ref 38.4–76.8)
Platelets: 563 10*3/uL — ABNORMAL HIGH (ref 145–400)
RBC: 3.16 10*6/uL — ABNORMAL LOW (ref 3.70–5.45)
RDW: 20 % — AB (ref 11.2–14.5)
WBC: 7.9 10*3/uL (ref 3.9–10.3)

## 2015-03-31 MED ORDER — BORTEZOMIB CHEMO SQ INJECTION 3.5 MG (2.5MG/ML)
1.5000 mg/m2 | Freq: Once | INTRAMUSCULAR | Status: AC
  Administered 2015-03-31: 2.25 mg via SUBCUTANEOUS
  Filled 2015-03-31: qty 2.25

## 2015-03-31 MED ORDER — ONDANSETRON HCL 8 MG PO TABS
8.0000 mg | ORAL_TABLET | Freq: Once | ORAL | Status: AC
  Administered 2015-03-31: 8 mg via ORAL

## 2015-03-31 NOTE — Patient Instructions (Signed)
[facility] Discharge Instructions for Patients Receiving Chemotherapy  Today you received the following chemotherapy agents: Velcade  To help prevent nausea and vomiting after your treatment, we encourage you to take your nausea medication as prescribed by your physician.   If you develop nausea and vomiting that is not controlled by your nausea medication, call the clinic.   BELOW ARE SYMPTOMS THAT SHOULD BE REPORTED IMMEDIATELY:  *FEVER GREATER THAN 100.5 F  *CHILLS WITH OR WITHOUT FEVER  NAUSEA AND VOMITING THAT IS NOT CONTROLLED WITH YOUR NAUSEA MEDICATION  *UNUSUAL SHORTNESS OF BREATH  *UNUSUAL BRUISING OR BLEEDING  TENDERNESS IN MOUTH AND THROAT WITH OR WITHOUT PRESENCE OF ULCERS  *URINARY PROBLEMS  *BOWEL PROBLEMS  UNUSUAL RASH Items with * indicate a potential emergency and should be followed up as soon as possible.  Feel free to call the clinic you have any questions or concerns. The clinic phone number is [phone].  Please show the CHEMO ALERT CARD at check-in to the Emergency Department and triage nurse.   

## 2015-04-06 ENCOUNTER — Ambulatory Visit (HOSPITAL_BASED_OUTPATIENT_CLINIC_OR_DEPARTMENT_OTHER): Payer: 59

## 2015-04-06 ENCOUNTER — Other Ambulatory Visit (HOSPITAL_BASED_OUTPATIENT_CLINIC_OR_DEPARTMENT_OTHER): Payer: 59

## 2015-04-06 VITALS — BP 110/69 | HR 92 | Temp 97.9°F | Resp 20

## 2015-04-06 DIAGNOSIS — E858 Other amyloidosis: Secondary | ICD-10-CM | POA: Diagnosis not present

## 2015-04-06 DIAGNOSIS — E8581 Light chain (AL) amyloidosis: Secondary | ICD-10-CM

## 2015-04-06 DIAGNOSIS — Z5112 Encounter for antineoplastic immunotherapy: Secondary | ICD-10-CM | POA: Diagnosis not present

## 2015-04-06 LAB — CBC WITH DIFFERENTIAL/PLATELET
BASO%: 0.5 % (ref 0.0–2.0)
BASOS ABS: 0 10*3/uL (ref 0.0–0.1)
EOS ABS: 0.2 10*3/uL (ref 0.0–0.5)
EOS%: 2 % (ref 0.0–7.0)
HCT: 29.6 % — ABNORMAL LOW (ref 34.8–46.6)
HEMOGLOBIN: 9.7 g/dL — AB (ref 11.6–15.9)
LYMPH%: 5.2 % — AB (ref 14.0–49.7)
MCH: 28 pg (ref 25.1–34.0)
MCHC: 32.8 g/dL (ref 31.5–36.0)
MCV: 85.6 fL (ref 79.5–101.0)
MONO#: 0.7 10*3/uL (ref 0.1–0.9)
MONO%: 8.3 % (ref 0.0–14.0)
NEUT%: 84 % — ABNORMAL HIGH (ref 38.4–76.8)
NEUTROS ABS: 6.7 10*3/uL — AB (ref 1.5–6.5)
PLATELETS: 668 10*3/uL — AB (ref 145–400)
RBC: 3.45 10*6/uL — ABNORMAL LOW (ref 3.70–5.45)
RDW: 20.8 % — AB (ref 11.2–14.5)
WBC: 8 10*3/uL (ref 3.9–10.3)
lymph#: 0.4 10*3/uL — ABNORMAL LOW (ref 0.9–3.3)

## 2015-04-06 LAB — COMPREHENSIVE METABOLIC PANEL (CC13)
ALT: 12 U/L (ref 0–55)
AST: 16 U/L (ref 5–34)
Albumin: 1 g/dL — ABNORMAL LOW (ref 3.5–5.0)
Alkaline Phosphatase: 88 U/L (ref 40–150)
Anion Gap: 3 mEq/L (ref 3–11)
BUN: 7.5 mg/dL (ref 7.0–26.0)
CHLORIDE: 111 meq/L — AB (ref 98–109)
CO2: 25 meq/L (ref 22–29)
CREATININE: 0.6 mg/dL (ref 0.6–1.1)
Calcium: 7.9 mg/dL — ABNORMAL LOW (ref 8.4–10.4)
EGFR: >90 mL/min/{1.73_m2} (ref >90)
Glucose: 87 mg/dl (ref 70–140)
POTASSIUM: 4 meq/L (ref 3.5–5.1)
Sodium: 140 mEq/L (ref 136–145)
Total Bilirubin: <0.3 mg/dL (ref 0.20–1.20)
Total Protein: 4.1 g/dL — ABNORMAL LOW (ref 6.4–8.3)

## 2015-04-06 MED ORDER — BORTEZOMIB CHEMO SQ INJECTION 3.5 MG (2.5MG/ML)
1.5000 mg/m2 | Freq: Once | INTRAMUSCULAR | Status: AC
  Administered 2015-04-06: 2.25 mg via SUBCUTANEOUS
  Filled 2015-04-06: qty 0.9

## 2015-04-06 MED ORDER — ONDANSETRON HCL 8 MG PO TABS
8.0000 mg | ORAL_TABLET | Freq: Once | ORAL | Status: AC
  Administered 2015-04-06: 8 mg via ORAL

## 2015-04-06 MED ORDER — ONDANSETRON HCL 8 MG PO TABS
ORAL_TABLET | ORAL | Status: AC
  Filled 2015-04-06: qty 1

## 2015-04-06 NOTE — Patient Instructions (Signed)
[facility] Discharge Instructions for Patients Receiving Chemotherapy  Today you received the following chemotherapy agents:  Velcade  To help prevent nausea and vomiting after your treatment, we encourage you to take your nausea medication as ordered per MD.   If you develop nausea and vomiting that is not controlled by your nausea medication, call the clinic.   BELOW ARE SYMPTOMS THAT SHOULD BE REPORTED IMMEDIATELY:  *FEVER GREATER THAN 100.5 F  *CHILLS WITH OR WITHOUT FEVER  NAUSEA AND VOMITING THAT IS NOT CONTROLLED WITH YOUR NAUSEA MEDICATION  *UNUSUAL SHORTNESS OF BREATH  *UNUSUAL BRUISING OR BLEEDING  TENDERNESS IN MOUTH AND THROAT WITH OR WITHOUT PRESENCE OF ULCERS  *URINARY PROBLEMS  *BOWEL PROBLEMS  UNUSUAL RASH Items with * indicate a potential emergency and should be followed up as soon as possible.  Feel free to call the clinic you have any questions or concerns. The clinic phone number is [phone].  Please show the CHEMO ALERT CARD at check-in to the Emergency Department and triage nurse.   

## 2015-04-13 ENCOUNTER — Other Ambulatory Visit (HOSPITAL_BASED_OUTPATIENT_CLINIC_OR_DEPARTMENT_OTHER): Payer: 59

## 2015-04-13 ENCOUNTER — Ambulatory Visit (HOSPITAL_BASED_OUTPATIENT_CLINIC_OR_DEPARTMENT_OTHER): Payer: 59

## 2015-04-13 VITALS — BP 109/66 | HR 80 | Temp 97.8°F | Resp 16

## 2015-04-13 DIAGNOSIS — E858 Other amyloidosis: Secondary | ICD-10-CM

## 2015-04-13 DIAGNOSIS — E8581 Light chain (AL) amyloidosis: Secondary | ICD-10-CM

## 2015-04-13 DIAGNOSIS — Z5112 Encounter for antineoplastic immunotherapy: Secondary | ICD-10-CM

## 2015-04-13 LAB — CBC WITH DIFFERENTIAL/PLATELET
BASO%: 0.4 % (ref 0.0–2.0)
Basophils Absolute: 0 10*3/uL (ref 0.0–0.1)
EOS%: 1.5 % (ref 0.0–7.0)
Eosinophils Absolute: 0.1 10*3/uL (ref 0.0–0.5)
HCT: 28.8 % — ABNORMAL LOW (ref 34.8–46.6)
HGB: 9.6 g/dL — ABNORMAL LOW (ref 11.6–15.9)
LYMPH%: 5.1 % — ABNORMAL LOW (ref 14.0–49.7)
MCH: 28.3 pg (ref 25.1–34.0)
MCHC: 33.4 g/dL (ref 31.5–36.0)
MCV: 84.6 fL (ref 79.5–101.0)
MONO#: 0.7 10*3/uL (ref 0.1–0.9)
MONO%: 7.9 % (ref 0.0–14.0)
NEUT#: 7.5 10*3/uL — ABNORMAL HIGH (ref 1.5–6.5)
NEUT%: 85.1 % — ABNORMAL HIGH (ref 38.4–76.8)
Platelets: 468 10*3/uL — ABNORMAL HIGH (ref 145–400)
RBC: 3.4 10*6/uL — ABNORMAL LOW (ref 3.70–5.45)
RDW: 20.6 % — ABNORMAL HIGH (ref 11.2–14.5)
WBC: 8.8 10*3/uL (ref 3.9–10.3)
lymph#: 0.5 10*3/uL — ABNORMAL LOW (ref 0.9–3.3)

## 2015-04-13 LAB — COMPREHENSIVE METABOLIC PANEL (CC13)
ALT: 15 U/L (ref 0–55)
AST: 20 U/L (ref 5–34)
Albumin: 1 g/dL — ABNORMAL LOW (ref 3.5–5.0)
Alkaline Phosphatase: 93 U/L (ref 40–150)
Anion Gap: 3 mEq/L (ref 3–11)
BUN: 6.9 mg/dL — ABNORMAL LOW (ref 7.0–26.0)
CO2: 27 mEq/L (ref 22–29)
Calcium: 8 mg/dL — ABNORMAL LOW (ref 8.4–10.4)
Chloride: 109 mEq/L (ref 98–109)
Creatinine: 0.6 mg/dL (ref 0.6–1.1)
EGFR: >90 mL/min/{1.73_m2} (ref >90)
Glucose: 98 mg/dl (ref 70–140)
Potassium: 4.1 mEq/L (ref 3.5–5.1)
Sodium: 140 mEq/L (ref 136–145)
Total Bilirubin: <0.3 mg/dL (ref 0.20–1.20)
Total Protein: 4 g/dL — ABNORMAL LOW (ref 6.4–8.3)

## 2015-04-13 MED ORDER — ONDANSETRON HCL 8 MG PO TABS
ORAL_TABLET | ORAL | Status: AC
  Filled 2015-04-13: qty 1

## 2015-04-13 MED ORDER — ONDANSETRON HCL 8 MG PO TABS
8.0000 mg | ORAL_TABLET | Freq: Once | ORAL | Status: AC
  Administered 2015-04-13: 8 mg via ORAL

## 2015-04-13 MED ORDER — BORTEZOMIB CHEMO SQ INJECTION 3.5 MG (2.5MG/ML)
1.5000 mg/m2 | Freq: Once | INTRAMUSCULAR | Status: AC
  Administered 2015-04-13: 2.25 mg via SUBCUTANEOUS
  Filled 2015-04-13: qty 2.25

## 2015-04-13 NOTE — Patient Instructions (Signed)
[facility] Discharge Instructions for Patients Receiving Chemotherapy  Today you received the following chemotherapy agents velcade.  To help prevent nausea and vomiting after your treatment, we encourage you to take your nausea medication .   If you develop nausea and vomiting that is not controlled by your nausea medication, call the clinic.   BELOW ARE SYMPTOMS THAT SHOULD BE REPORTED IMMEDIATELY:  *FEVER GREATER THAN 100.5 F  *CHILLS WITH OR WITHOUT FEVER  NAUSEA AND VOMITING THAT IS NOT CONTROLLED WITH YOUR NAUSEA MEDICATION  *UNUSUAL SHORTNESS OF BREATH  *UNUSUAL BRUISING OR BLEEDING  TENDERNESS IN MOUTH AND THROAT WITH OR WITHOUT PRESENCE OF ULCERS  *URINARY PROBLEMS  *BOWEL PROBLEMS  UNUSUAL RASH Items with * indicate a potential emergency and should be followed up as soon as possible.  Feel free to call the clinic you have any questions or concerns. The clinic phone number is [phone].  Please show the CHEMO ALERT CARD at check-in to the Emergency Department and triage nurse.   

## 2015-04-20 ENCOUNTER — Other Ambulatory Visit (HOSPITAL_BASED_OUTPATIENT_CLINIC_OR_DEPARTMENT_OTHER): Payer: 59

## 2015-04-20 ENCOUNTER — Ambulatory Visit (HOSPITAL_BASED_OUTPATIENT_CLINIC_OR_DEPARTMENT_OTHER): Payer: 59

## 2015-04-20 VITALS — BP 108/71 | HR 78 | Temp 98.2°F | Resp 16

## 2015-04-20 DIAGNOSIS — Z5112 Encounter for antineoplastic immunotherapy: Secondary | ICD-10-CM | POA: Diagnosis not present

## 2015-04-20 DIAGNOSIS — E8581 Light chain (AL) amyloidosis: Secondary | ICD-10-CM

## 2015-04-20 DIAGNOSIS — E858 Other amyloidosis: Secondary | ICD-10-CM | POA: Diagnosis not present

## 2015-04-20 LAB — CBC WITH DIFFERENTIAL/PLATELET
BASO%: 0.3 % (ref 0.0–2.0)
BASOS ABS: 0 10*3/uL (ref 0.0–0.1)
EOS%: 1.7 % (ref 0.0–7.0)
Eosinophils Absolute: 0.1 10*3/uL (ref 0.0–0.5)
HCT: 30.1 % — ABNORMAL LOW (ref 34.8–46.6)
HEMOGLOBIN: 9.8 g/dL — AB (ref 11.6–15.9)
LYMPH%: 8.6 % — AB (ref 14.0–49.7)
MCH: 27.9 pg (ref 25.1–34.0)
MCHC: 32.6 g/dL (ref 31.5–36.0)
MCV: 85.8 fL (ref 79.5–101.0)
MONO#: 0.6 10*3/uL (ref 0.1–0.9)
MONO%: 8.3 % (ref 0.0–14.0)
NEUT#: 5.6 10*3/uL (ref 1.5–6.5)
NEUT%: 81.1 % — ABNORMAL HIGH (ref 38.4–76.8)
Platelets: 421 10*3/uL — ABNORMAL HIGH (ref 145–400)
RBC: 3.51 10*6/uL — ABNORMAL LOW (ref 3.70–5.45)
RDW: 19.3 % — ABNORMAL HIGH (ref 11.2–14.5)
WBC: 7 10*3/uL (ref 3.9–10.3)
lymph#: 0.6 10*3/uL — ABNORMAL LOW (ref 0.9–3.3)

## 2015-04-20 LAB — COMPREHENSIVE METABOLIC PANEL (CC13)
ALBUMIN: 1 g/dL — AB (ref 3.5–5.0)
ALK PHOS: 83 U/L (ref 40–150)
ALT: 13 U/L (ref 0–55)
AST: 17 U/L (ref 5–34)
Anion Gap: 4 mEq/L (ref 3–11)
BUN: 8.7 mg/dL (ref 7.0–26.0)
CALCIUM: 8.4 mg/dL (ref 8.4–10.4)
CO2: 27 meq/L (ref 22–29)
CREATININE: 0.6 mg/dL (ref 0.6–1.1)
Chloride: 110 mEq/L — ABNORMAL HIGH (ref 98–109)
GLUCOSE: 87 mg/dL (ref 70–140)
Potassium: 3.8 mEq/L (ref 3.5–5.1)
SODIUM: 141 meq/L (ref 136–145)
Total Protein: 4 g/dL — ABNORMAL LOW (ref 6.4–8.3)

## 2015-04-20 MED ORDER — BORTEZOMIB CHEMO SQ INJECTION 3.5 MG (2.5MG/ML)
1.5000 mg/m2 | Freq: Once | INTRAMUSCULAR | Status: AC
  Administered 2015-04-20: 2.25 mg via SUBCUTANEOUS
  Filled 2015-04-20: qty 2.25

## 2015-04-20 MED ORDER — ONDANSETRON HCL 8 MG PO TABS
ORAL_TABLET | ORAL | Status: AC
  Filled 2015-04-20: qty 1

## 2015-04-20 MED ORDER — ONDANSETRON HCL 8 MG PO TABS
8.0000 mg | ORAL_TABLET | Freq: Once | ORAL | Status: AC
  Administered 2015-04-20: 8 mg via ORAL

## 2015-04-20 NOTE — Patient Instructions (Signed)
[facility] Discharge Instructions for Patients Receiving Chemotherapy  Today you received the following chemotherapy agents Velcade. To help prevent nausea and vomiting after your treatment, we encourage you to take your nausea medication as directed.  If you develop nausea and vomiting that is not controlled by your nausea medication, call the clinic.   BELOW ARE SYMPTOMS THAT SHOULD BE REPORTED IMMEDIATELY:  *FEVER GREATER THAN 100.5 F  *CHILLS WITH OR WITHOUT FEVER  NAUSEA AND VOMITING THAT IS NOT CONTROLLED WITH YOUR NAUSEA MEDICATION  *UNUSUAL SHORTNESS OF BREATH  *UNUSUAL BRUISING OR BLEEDING  TENDERNESS IN MOUTH AND THROAT WITH OR WITHOUT PRESENCE OF ULCERS  *URINARY PROBLEMS  *BOWEL PROBLEMS  UNUSUAL RASH Items with * indicate a potential emergency and should be followed up as soon as possible.  Feel free to call the clinic you have any questions or concerns. The clinic phone number is [phone].  Please show the CHEMO ALERT CARD at check-in to the Emergency Department and triage nurse.    

## 2015-04-21 LAB — KAPPA/LAMBDA LIGHT CHAINS
Kappa free light chain: 0.65 mg/dL (ref 0.33–1.94)
Kappa:Lambda Ratio: 1.2 (ref 0.26–1.65)
Lambda Free Lght Chn: 0.54 mg/dL — ABNORMAL LOW (ref 0.57–2.63)

## 2015-04-27 ENCOUNTER — Other Ambulatory Visit (HOSPITAL_BASED_OUTPATIENT_CLINIC_OR_DEPARTMENT_OTHER): Payer: 59

## 2015-04-27 ENCOUNTER — Ambulatory Visit (HOSPITAL_BASED_OUTPATIENT_CLINIC_OR_DEPARTMENT_OTHER): Payer: 59 | Admitting: Oncology

## 2015-04-27 ENCOUNTER — Telehealth: Payer: Self-pay | Admitting: *Deleted

## 2015-04-27 ENCOUNTER — Telehealth: Payer: Self-pay | Admitting: Oncology

## 2015-04-27 ENCOUNTER — Ambulatory Visit (HOSPITAL_BASED_OUTPATIENT_CLINIC_OR_DEPARTMENT_OTHER): Payer: 59

## 2015-04-27 ENCOUNTER — Other Ambulatory Visit: Payer: Self-pay | Admitting: Oncology

## 2015-04-27 ENCOUNTER — Other Ambulatory Visit: Payer: Self-pay | Admitting: *Deleted

## 2015-04-27 VITALS — BP 99/62 | HR 82 | Temp 97.8°F | Resp 18 | Ht 61.0 in | Wt 121.7 lb

## 2015-04-27 DIAGNOSIS — Z5112 Encounter for antineoplastic immunotherapy: Secondary | ICD-10-CM

## 2015-04-27 DIAGNOSIS — E8581 Light chain (AL) amyloidosis: Secondary | ICD-10-CM

## 2015-04-27 DIAGNOSIS — R5383 Other fatigue: Secondary | ICD-10-CM

## 2015-04-27 DIAGNOSIS — R630 Anorexia: Secondary | ICD-10-CM

## 2015-04-27 DIAGNOSIS — E858 Other amyloidosis: Secondary | ICD-10-CM

## 2015-04-27 DIAGNOSIS — R601 Generalized edema: Secondary | ICD-10-CM

## 2015-04-27 LAB — COMPREHENSIVE METABOLIC PANEL (CC13)
ALT: 17 U/L (ref 0–55)
ANION GAP: 4 meq/L (ref 3–11)
AST: 22 U/L (ref 5–34)
Albumin: 1.1 g/dL — ABNORMAL LOW (ref 3.5–5.0)
Alkaline Phosphatase: 90 U/L (ref 40–150)
BUN: 12.2 mg/dL (ref 7.0–26.0)
CO2: 25 meq/L (ref 22–29)
Calcium: 8 mg/dL — ABNORMAL LOW (ref 8.4–10.4)
Chloride: 109 mEq/L (ref 98–109)
Creatinine: 0.5 mg/dL — ABNORMAL LOW (ref 0.6–1.1)
GLUCOSE: 95 mg/dL (ref 70–140)
Potassium: 3.9 mEq/L (ref 3.5–5.1)
SODIUM: 138 meq/L (ref 136–145)
TOTAL PROTEIN: 4.1 g/dL — AB (ref 6.4–8.3)

## 2015-04-27 LAB — CBC WITH DIFFERENTIAL/PLATELET
BASO%: 1 % (ref 0.0–2.0)
BASOS ABS: 0.1 10*3/uL (ref 0.0–0.1)
EOS%: 3.1 % (ref 0.0–7.0)
Eosinophils Absolute: 0.2 10*3/uL (ref 0.0–0.5)
HEMATOCRIT: 29.9 % — AB (ref 34.8–46.6)
HEMOGLOBIN: 10.3 g/dL — AB (ref 11.6–15.9)
LYMPH#: 0.4 10*3/uL — AB (ref 0.9–3.3)
LYMPH%: 5.9 % — ABNORMAL LOW (ref 14.0–49.7)
MCH: 29.4 pg (ref 25.1–34.0)
MCHC: 34.3 g/dL (ref 31.5–36.0)
MCV: 85.7 fL (ref 79.5–101.0)
MONO#: 0.5 10*3/uL (ref 0.1–0.9)
MONO%: 8.6 % (ref 0.0–14.0)
NEUT#: 5.1 10*3/uL (ref 1.5–6.5)
NEUT%: 81.4 % — ABNORMAL HIGH (ref 38.4–76.8)
PLATELETS: 426 10*3/uL — AB (ref 145–400)
RBC: 3.49 10*6/uL — ABNORMAL LOW (ref 3.70–5.45)
RDW: 21.1 % — AB (ref 11.2–14.5)
WBC: 6.3 10*3/uL (ref 3.9–10.3)

## 2015-04-27 MED ORDER — ONDANSETRON HCL 8 MG PO TABS
ORAL_TABLET | ORAL | Status: AC
  Filled 2015-04-27: qty 1

## 2015-04-27 MED ORDER — BORTEZOMIB CHEMO SQ INJECTION 3.5 MG (2.5MG/ML)
1.5000 mg/m2 | Freq: Once | INTRAMUSCULAR | Status: AC
  Administered 2015-04-27: 2.25 mg via SUBCUTANEOUS
  Filled 2015-04-27: qty 2.25

## 2015-04-27 MED ORDER — MIDODRINE HCL 5 MG PO TABS: 5.0000 mg | ORAL_TABLET | Freq: Two times a day (BID) | ORAL | Status: DC

## 2015-04-27 MED ORDER — ONDANSETRON HCL 8 MG PO TABS
8.0000 mg | ORAL_TABLET | Freq: Once | ORAL | Status: AC
  Administered 2015-04-27: 8 mg via ORAL

## 2015-04-27 NOTE — Progress Notes (Signed)
Hematology and Oncology Follow Up Visit  Anna Calderon [id number] Jun 14, 1961 54 y.o. 04/27/2015 9:39 AM Pcp Not In SystemNo ref. 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 Her disease is involving the bone marrow as well as the kidneys. She has potentially cardiac and neurological involvement.   Prior Therapy: She is status post kidney biopsy done on 08/10/2014 and showed positive staining for amyloidosis.  She is status post bone marrow biopsy on 09/20/2014 which showed 55% plasma cell infiltration suggesting plasma cell neoplasm.   Current therapy:   Velcade, Cytoxan and dexamethasone as follows:  Velcade at 1.5 mg/m subcutaneously on a weekly basis. Cyclophosphamide 300 mg/m (total dose of close to 450 mg) on a weekly basis. Dexamethasone at 20 mg by mouth (reduced from 40 mg because of her fluid retention) on a weekly basis.   Therapy started on 11/24/2014.  Interim History:   Anna Calderon presents today for a follow-up visit with her daughter. Since her last visit, she continues to tolerate this therapy without any complications. She is recovering from her recent hospitalization in September 2016. No signs or symptoms of recurrent sepsis. He is able to eat well without any issues. She has not reported any fevers or chills. Has not reported any dizziness or unsteadiness.   She has tolerated this treatment without any major complications. She does report some mild fatigue and anorexia associated with treatment. These have improved since the last visit. She has not reported any nausea or vomiting. She does have a very mild diarrhea which also have resolved. She has not reported any neuropathy or Velcade related injection complications.  She does not report any headaches, blurry vision, syncope or seizures. She denied any chest pain or shortness of breath.   She did not report any easy bruisability or petechiae.  She does not report any fevers, chills, sweats, weight loss or abdominal discomfort. Did not report any nausea, vomiting. She does not report any urgency or hesitancy. She did not report any hematuria. She did not report any skeletal pain, lymphadenopathy or easy bruisability. The remaining review of systems unremarkable  Medications: I have reviewed the patient's current medications.  Current Outpatient Prescriptions  Medication Sig Dispense Refill  . acyclovir (ZOVIRAX) 400 MG tablet TAKE 1 TABLET BY MOUTH TWICE DAILY 60 tablet 0  . aspirin 325 MG tablet Take 325 mg by mouth once.    . Cholecalciferol (VITAMIN D3) 1000 UNITS CAPS Take 1 tablet by mouth daily.    . cyclophosphamide (CYTOXAN) 50 MG tablet Take 9 tablets weekly on an empty stomach on the day of your chemotherapy. 36 tablet 0  . dexamethasone (DECADRON) 4 MG tablet Take 5 tablets every week with chemotherapy. 60 tablet 3  . furosemide (LASIX) 20 MG tablet Take 1 tablet (20 mg total) by mouth daily as needed for fluid or edema. (Patient taking differently: Take 40 mg by mouth daily as needed for fluid or edema. ) 30 tablet 0  . midodrine (PROAMATINE) 5 MG tablet Take 5 mg by mouth 2 (two) times daily.    Marland Kitchen oxyCODONE (OXY IR/ROXICODONE) 5 MG immediate release tablet Take 1 tablet (5 mg total) by mouth every 4 (four) hours as needed for moderate pain. 30 tablet 0  . potassium chloride (K-DUR) 10 MEQ tablet Take 20 mEq by mouth 2 (two) times daily.    . prochlorperazine (COMPAZINE) 10 MG tablet  Take 1 tablet (10 mg total) by mouth every 6 (six) hours as needed for nausea or vomiting. 30 tablet 0   No current facility-administered medications for this visit.     Allergies: No Known Allergies  Past Medical History, Surgical history, Social history, and Family History were reviewed and updated.   Physical Exam: Blood pressure 99/62, pulse 82, temperature 97.8 F (36.6 C),  temperature source Oral, resp. rate 18, height '5\' 1"'  (1.549 m), weight 121 lb 11.2 oz (55.203 kg), SpO2 100 %.  ECOG: 1 General appearance: alert and cooperative not in any distress. Head: Normocephalic, without obvious abnormality  No thrush noted. Neck: no adenopathy Lymph nodes: Cervical, supraclavicular, and axillary nodes normal. Heart:regular rate and rhythm, S1, S2 normal, no murmur, click, rub or gallop Lung:chest clear, no wheezing, rales, normal symmetric air entry. No dullness to percussion. Abdomin: soft, non-tender, without masses or organomegaly.  No shifting dullness or ascites. EXT: Bilateral lower extremity edema still noted. Skin: No ecchymosis or petechiae.  Lab Results: Lab Results  Component Value Date   WBC 6.3 04/27/2015   HGB 10.3* 04/27/2015   HCT 29.9* 04/27/2015   MCV 85.7 04/27/2015   PLT 426* 04/27/2015     Chemistry      Component Value Date/Time   NA 141 04/20/2015 0950   NA 140 03/13/2015 0531   K 3.8 04/20/2015 0950   K 3.5 03/13/2015 0531   CL 107 03/13/2015 0531   CO2 27 04/20/2015 0950   CO2 29 03/13/2015 0531   BUN 8.7 04/20/2015 0950   BUN 18 03/13/2015 0531   CREATININE 0.6 04/20/2015 0950   CREATININE 0.68 03/13/2015 0531      Component Value Date/Time   CALCIUM 8.4 04/20/2015 0950   CALCIUM 7.8* 03/13/2015 0531   ALKPHOS 83 04/20/2015 0950   ALKPHOS 66 03/07/2015 2008   AST 17 04/20/2015 0950   AST 23 03/07/2015 2008   ALT 13 04/20/2015 0950   ALT 13* 03/07/2015 2008   BILITOT <0.30 04/20/2015 0950   BILITOT 0.8 03/07/2015 2008       Results for LINELL, MELDRUM (MRN [id number]) as of 04/27/2015 09:22  Ref. Range 04/20/2015 09:50  Kappa free light chain Latest Ref Range: 0.33-1.94 mg/dL 0.65  Lambda Free Lght Chn Latest Ref Range: 0.57-2.63 mg/dL 0.54 (L)  Kappa:Lambda Ratio Latest Ref Range: 0.26-1.65  1.20       Impression and Plan:   54 year old woman with the following issues:  1. AL amyloidosis: Presented with  a plasma cell disorder manifesting itself with bone marrow involvement with 19% plasma cells that are lambda light chain specific. Her free lambda light chain in the serum are elevated at 29 with At the lambda ratio depleted is 0.05. Staging CT scan did not show any evidence of clear-cut other organ involvement. 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   She is currently receiving VCD without any major complications. Her protein studies from 04/20/2015 reviewed today reviewed today and continues to show normalization of her lambda free light chains. She has a normal ratio of kappa to lambda. This confirms a hematological response at this time.  The plan is to continue with the current dose and regimen and she will follow-up at [facility] regarding her amyloidosis. I anticipate that she will need to be on this regimen through January 2017. Further therapy beyond that we'll be per the discretion of the Amyloidosis team that she has a [place].   2. Generalized anasarca: She is currently on diuretics with good response. She is currently receiving Lasix 40 mg daily with potassium supplements. Her electrolytes from last week appear within normal range and her potassium and electrolytes will be repeated today.  3. Antiemetics: Continue Compazine as needed. No issues described by the patient this time.  4. VZV prophylaxis: Continue acyclovir as prescribed. No signs or symptoms of infection.  5. Nutrition: Nutrition status has improved at this time.  6. Fever and sepsis: This have resolved at this time and she has finished a course of anti-biotics prophylactically.  7. Cardiac complications of amyloid: She is following up with cardiology at [facility] regarding this issue. Her cardiac status. Stable.  8. Follow-up: Will be in one week for the next treatment. I have scheduled her chemotherapy for the next month and a follow-up for clinical  evaluation on on 06/01/2015.   [facility], MD  11/3/20169:39 AM

## 2015-04-27 NOTE — Telephone Encounter (Signed)
per reply from MW-cld pt and adv of appt time & date on 11/11

## 2015-04-27 NOTE — Telephone Encounter (Signed)
per pof to schpt appt-sent MW email to sch pt trmt-gave pt copy of avs-willc all after reply

## 2015-04-27 NOTE — Telephone Encounter (Signed)
Per staff message and POF I have scheduled appts. Advised scheduler of appts. Also to move lab appts and no available on 11/10 moved to 11/11  JMW

## 2015-04-27 NOTE — Patient Instructions (Signed)
[facility] Discharge Instructions for Patients Receiving Chemotherapy  Today you received the following chemotherapy agents velcade To help prevent nausea and vomiting after your treatment, we encourage you to take your nausea medication as prescribed.   If you develop nausea and vomiting that is not controlled by your nausea medication, call the clinic.   BELOW ARE SYMPTOMS THAT SHOULD BE REPORTED IMMEDIATELY:  *FEVER GREATER THAN 100.5 F  *CHILLS WITH OR WITHOUT FEVER  NAUSEA AND VOMITING THAT IS NOT CONTROLLED WITH YOUR NAUSEA MEDICATION  *UNUSUAL SHORTNESS OF BREATH  *UNUSUAL BRUISING OR BLEEDING  TENDERNESS IN MOUTH AND THROAT WITH OR WITHOUT PRESENCE OF ULCERS  *URINARY PROBLEMS  *BOWEL PROBLEMS  UNUSUAL RASH Items with * indicate a potential emergency and should be followed up as soon as possible.  Feel free to call the clinic you have any questions or concerns. The clinic phone number is [phone].  Please show the CHEMO ALERT CARD at check-in to the Emergency Department and triage nurse.   

## 2015-05-04 ENCOUNTER — Other Ambulatory Visit: Payer: Self-pay

## 2015-05-05 ENCOUNTER — Other Ambulatory Visit (HOSPITAL_BASED_OUTPATIENT_CLINIC_OR_DEPARTMENT_OTHER): Payer: 59

## 2015-05-05 ENCOUNTER — Ambulatory Visit (HOSPITAL_BASED_OUTPATIENT_CLINIC_OR_DEPARTMENT_OTHER): Payer: 59

## 2015-05-05 ENCOUNTER — Telehealth: Payer: Self-pay | Admitting: Oncology

## 2015-05-05 VITALS — BP 107/58 | HR 79 | Temp 98.2°F | Resp 20

## 2015-05-05 DIAGNOSIS — Z5112 Encounter for antineoplastic immunotherapy: Secondary | ICD-10-CM

## 2015-05-05 DIAGNOSIS — E858 Other amyloidosis: Secondary | ICD-10-CM | POA: Diagnosis not present

## 2015-05-05 DIAGNOSIS — E8581 Light chain (AL) amyloidosis: Secondary | ICD-10-CM

## 2015-05-05 LAB — CBC WITH DIFFERENTIAL/PLATELET
BASO%: 0.2 % (ref 0.0–2.0)
BASOS ABS: 0 10*3/uL (ref 0.0–0.1)
EOS%: 2.6 % (ref 0.0–7.0)
Eosinophils Absolute: 0.2 10*3/uL (ref 0.0–0.5)
HEMATOCRIT: 30.3 % — AB (ref 34.8–46.6)
HEMOGLOBIN: 9.9 g/dL — AB (ref 11.6–15.9)
LYMPH#: 0.4 10*3/uL — AB (ref 0.9–3.3)
LYMPH%: 7 % — ABNORMAL LOW (ref 14.0–49.7)
MCH: 28.1 pg (ref 25.1–34.0)
MCHC: 32.7 g/dL (ref 31.5–36.0)
MCV: 86.1 fL (ref 79.5–101.0)
MONO#: 0.5 10*3/uL (ref 0.1–0.9)
MONO%: 9.4 % (ref 0.0–14.0)
NEUT#: 4.6 10*3/uL (ref 1.5–6.5)
NEUT%: 80.8 % — AB (ref 38.4–76.8)
PLATELETS: 288 10*3/uL (ref 145–400)
RBC: 3.52 10*6/uL — ABNORMAL LOW (ref 3.70–5.45)
RDW: 19.8 % — AB (ref 11.2–14.5)
WBC: 5.7 10*3/uL (ref 3.9–10.3)

## 2015-05-05 LAB — COMPREHENSIVE METABOLIC PANEL (CC13)
ALT: 17 U/L (ref 0–55)
AST: 21 U/L (ref 5–34)
Albumin: 1.1 g/dL — ABNORMAL LOW (ref 3.5–5.0)
Alkaline Phosphatase: 84 U/L (ref 40–150)
Anion Gap: 8 meq/L (ref 3–11)
BUN: 12.9 mg/dL (ref 7.0–26.0)
CO2: 24 meq/L (ref 22–29)
Calcium: 7.9 mg/dL — ABNORMAL LOW (ref 8.4–10.4)
Chloride: 109 meq/L (ref 98–109)
Creatinine: 0.6 mg/dL (ref 0.6–1.1)
EGFR: >90 ml/min/1.73 m2
Glucose: 97 mg/dL (ref 70–140)
Potassium: 3.8 meq/L (ref 3.5–5.1)
Sodium: 141 meq/L (ref 136–145)
Total Bilirubin: <0.3 mg/dL (ref 0.20–1.20)
Total Protein: 4.1 g/dL — ABNORMAL LOW (ref 6.4–8.3)

## 2015-05-05 MED ORDER — ONDANSETRON HCL 8 MG PO TABS
8.0000 mg | ORAL_TABLET | Freq: Once | ORAL | Status: AC
  Administered 2015-05-05: 8 mg via ORAL

## 2015-05-05 MED ORDER — ONDANSETRON HCL 8 MG PO TABS
ORAL_TABLET | ORAL | Status: AC
  Filled 2015-05-05: qty 1

## 2015-05-05 MED ORDER — BORTEZOMIB CHEMO SQ INJECTION 3.5 MG (2.5MG/ML)
1.5000 mg/m2 | Freq: Once | INTRAMUSCULAR | Status: AC
  Administered 2015-05-05: 2.25 mg via SUBCUTANEOUS
  Filled 2015-05-05: qty 2.25

## 2015-05-05 MED ORDER — SODIUM CHLORIDE 0.9 % IV SOLN: Freq: Once | INTRAVENOUS | Status: DC

## 2015-05-05 NOTE — Patient Instructions (Signed)
[facility] Discharge Instructions for Patients Receiving Chemotherapy  Today you received the following chemotherapy agents Velcade  To help prevent nausea and vomiting after your treatment, we encourage you to take your nausea medication    If you develop nausea and vomiting that is not controlled by your nausea medication, call the clinic.   BELOW ARE SYMPTOMS THAT SHOULD BE REPORTED IMMEDIATELY:  *FEVER GREATER THAN 100.5 F  *CHILLS WITH OR WITHOUT FEVER  NAUSEA AND VOMITING THAT IS NOT CONTROLLED WITH YOUR NAUSEA MEDICATION  *UNUSUAL SHORTNESS OF BREATH  *UNUSUAL BRUISING OR BLEEDING  TENDERNESS IN MOUTH AND THROAT WITH OR WITHOUT PRESENCE OF ULCERS  *URINARY PROBLEMS  *BOWEL PROBLEMS  UNUSUAL RASH Items with * indicate a potential emergency and should be followed up as soon as possible.  Feel free to call the clinic you have any questions or concerns. The clinic phone number is [phone].  Please show the CHEMO ALERT CARD at check-in to the Emergency Department and triage nurse.   

## 2015-05-05 NOTE — Telephone Encounter (Signed)
Pt came in wanting to change 12/1 appt to 12/2 because she has another appt on 12/1. Gave pt appt for 12/2 @ 10.30am.

## 2015-05-11 ENCOUNTER — Other Ambulatory Visit (HOSPITAL_BASED_OUTPATIENT_CLINIC_OR_DEPARTMENT_OTHER): Payer: 59

## 2015-05-11 ENCOUNTER — Ambulatory Visit (HOSPITAL_BASED_OUTPATIENT_CLINIC_OR_DEPARTMENT_OTHER): Payer: 59

## 2015-05-11 VITALS — BP 103/66 | HR 79 | Temp 97.0°F | Resp 18

## 2015-05-11 DIAGNOSIS — E8581 Light chain (AL) amyloidosis: Secondary | ICD-10-CM

## 2015-05-11 DIAGNOSIS — Z5112 Encounter for antineoplastic immunotherapy: Secondary | ICD-10-CM | POA: Diagnosis not present

## 2015-05-11 DIAGNOSIS — E858 Other amyloidosis: Secondary | ICD-10-CM | POA: Diagnosis not present

## 2015-05-11 LAB — CBC WITH DIFFERENTIAL/PLATELET
BASO%: 0.9 % (ref 0.0–2.0)
Basophils Absolute: 0 10*3/uL (ref 0.0–0.1)
EOS ABS: 0.1 10*3/uL (ref 0.0–0.5)
EOS%: 3 % (ref 0.0–7.0)
HCT: 32.6 % — ABNORMAL LOW (ref 34.8–46.6)
HEMOGLOBIN: 10.9 g/dL — AB (ref 11.6–15.9)
LYMPH%: 9.2 % — ABNORMAL LOW (ref 14.0–49.7)
MCH: 28.9 pg (ref 25.1–34.0)
MCHC: 33.6 g/dL (ref 31.5–36.0)
MCV: 86 fL (ref 79.5–101.0)
MONO#: 0.4 10*3/uL (ref 0.1–0.9)
MONO%: 10.8 % (ref 0.0–14.0)
NEUT%: 76.1 % (ref 38.4–76.8)
NEUTROS ABS: 3.2 10*3/uL (ref 1.5–6.5)
Platelets: 329 10*3/uL (ref 145–400)
RBC: 3.79 10*6/uL (ref 3.70–5.45)
RDW: 21.1 % — AB (ref 11.2–14.5)
WBC: 4.2 10*3/uL (ref 3.9–10.3)
lymph#: 0.4 10*3/uL — ABNORMAL LOW (ref 0.9–3.3)

## 2015-05-11 LAB — COMPREHENSIVE METABOLIC PANEL (CC13)
ALBUMIN: 1.1 g/dL — AB (ref 3.5–5.0)
ALT: 22 U/L (ref 0–55)
ANION GAP: 6 meq/L (ref 3–11)
AST: 30 U/L (ref 5–34)
Alkaline Phosphatase: 96 U/L (ref 40–150)
BUN: 8.4 mg/dL (ref 7.0–26.0)
CALCIUM: 7.7 mg/dL — AB (ref 8.4–10.4)
CO2: 24 mEq/L (ref 22–29)
CREATININE: 0.6 mg/dL (ref 0.6–1.1)
Chloride: 107 mEq/L (ref 98–109)
Glucose: 99 mg/dl (ref 70–140)
Potassium: 3.7 mEq/L (ref 3.5–5.1)
Sodium: 138 mEq/L (ref 136–145)
TOTAL PROTEIN: 4.2 g/dL — AB (ref 6.4–8.3)
Total Bilirubin: <0.3 mg/dL (ref 0.20–1.20)

## 2015-05-11 MED ORDER — ONDANSETRON HCL 8 MG PO TABS
ORAL_TABLET | ORAL | Status: AC
  Filled 2015-05-11: qty 1

## 2015-05-11 MED ORDER — BORTEZOMIB CHEMO SQ INJECTION 3.5 MG (2.5MG/ML)
1.5000 mg/m2 | Freq: Once | INTRAMUSCULAR | Status: AC
  Administered 2015-05-11: 2.25 mg via SUBCUTANEOUS
  Filled 2015-05-11: qty 2.25

## 2015-05-11 MED ORDER — ONDANSETRON HCL 8 MG PO TABS
8.0000 mg | ORAL_TABLET | Freq: Once | ORAL | Status: AC
  Administered 2015-05-11: 8 mg via ORAL

## 2015-05-11 NOTE — Patient Instructions (Signed)
[facility] Discharge Instructions for Patients Receiving Chemotherapy  Today you received the following chemotherapy agents Velcade. To help prevent nausea and vomiting after your treatment, we encourage you to take your nausea medication as directed.  If you develop nausea and vomiting that is not controlled by your nausea medication, call the clinic.   BELOW ARE SYMPTOMS THAT SHOULD BE REPORTED IMMEDIATELY:  *FEVER GREATER THAN 100.5 F  *CHILLS WITH OR WITHOUT FEVER  NAUSEA AND VOMITING THAT IS NOT CONTROLLED WITH YOUR NAUSEA MEDICATION  *UNUSUAL SHORTNESS OF BREATH  *UNUSUAL BRUISING OR BLEEDING  TENDERNESS IN MOUTH AND THROAT WITH OR WITHOUT PRESENCE OF ULCERS  *URINARY PROBLEMS  *BOWEL PROBLEMS  UNUSUAL RASH Items with * indicate a potential emergency and should be followed up as soon as possible.  Feel free to call the clinic you have any questions or concerns. The clinic phone number is [phone].  Please show the CHEMO ALERT CARD at check-in to the Emergency Department and triage nurse.    

## 2015-05-19 ENCOUNTER — Ambulatory Visit (HOSPITAL_BASED_OUTPATIENT_CLINIC_OR_DEPARTMENT_OTHER): Payer: 59

## 2015-05-19 ENCOUNTER — Other Ambulatory Visit (HOSPITAL_BASED_OUTPATIENT_CLINIC_OR_DEPARTMENT_OTHER): Payer: 59

## 2015-05-19 VITALS — BP 111/49 | HR 61 | Temp 98.7°F | Resp 18

## 2015-05-19 DIAGNOSIS — Z5112 Encounter for antineoplastic immunotherapy: Secondary | ICD-10-CM

## 2015-05-19 DIAGNOSIS — E8581 Light chain (AL) amyloidosis: Secondary | ICD-10-CM

## 2015-05-19 DIAGNOSIS — E858 Other amyloidosis: Secondary | ICD-10-CM | POA: Diagnosis not present

## 2015-05-19 LAB — COMPREHENSIVE METABOLIC PANEL (CC13)
ALK PHOS: 96 U/L (ref 40–150)
ALT: 19 U/L (ref 0–55)
ANION GAP: 7 meq/L (ref 3–11)
AST: 24 U/L (ref 5–34)
Albumin: 1.2 g/dL — ABNORMAL LOW (ref 3.5–5.0)
BUN: 13.9 mg/dL (ref 7.0–26.0)
CALCIUM: 8.1 mg/dL — AB (ref 8.4–10.4)
CO2: 25 mEq/L (ref 22–29)
Chloride: 108 mEq/L (ref 98–109)
Creatinine: 0.6 mg/dL (ref 0.6–1.1)
Glucose: 102 mg/dl (ref 70–140)
POTASSIUM: 4 meq/L (ref 3.5–5.1)
Sodium: 140 mEq/L (ref 136–145)
Total Protein: 3.9 g/dL — ABNORMAL LOW (ref 6.4–8.3)

## 2015-05-19 LAB — CBC WITH DIFFERENTIAL/PLATELET
BASO%: 0.9 % (ref 0.0–2.0)
Basophils Absolute: 0 10*3/uL (ref 0.0–0.1)
EOS%: 2.9 % (ref 0.0–7.0)
Eosinophils Absolute: 0.1 10*3/uL (ref 0.0–0.5)
HEMATOCRIT: 31.2 % — AB (ref 34.8–46.6)
HEMOGLOBIN: 10.6 g/dL — AB (ref 11.6–15.9)
LYMPH#: 0.5 10*3/uL — AB (ref 0.9–3.3)
LYMPH%: 9.9 % — ABNORMAL LOW (ref 14.0–49.7)
MCH: 28.9 pg (ref 25.1–34.0)
MCHC: 33.9 g/dL (ref 31.5–36.0)
MCV: 85.2 fL (ref 79.5–101.0)
MONO#: 0.6 10*3/uL (ref 0.1–0.9)
MONO%: 11.7 % (ref 0.0–14.0)
NEUT%: 74.6 % (ref 38.4–76.8)
NEUTROS ABS: 3.6 10*3/uL (ref 1.5–6.5)
Platelets: 288 10*3/uL (ref 145–400)
RBC: 3.67 10*6/uL — ABNORMAL LOW (ref 3.70–5.45)
RDW: 20.8 % — AB (ref 11.2–14.5)
WBC: 4.9 10*3/uL (ref 3.9–10.3)

## 2015-05-19 MED ORDER — ONDANSETRON HCL 8 MG PO TABS
8.0000 mg | ORAL_TABLET | Freq: Once | ORAL | Status: AC
  Administered 2015-05-19: 8 mg via ORAL

## 2015-05-19 MED ORDER — BORTEZOMIB CHEMO SQ INJECTION 3.5 MG (2.5MG/ML)
1.5000 mg/m2 | Freq: Once | INTRAMUSCULAR | Status: AC
  Administered 2015-05-19: 2.25 mg via SUBCUTANEOUS
  Filled 2015-05-19: qty 2.25

## 2015-05-19 MED ORDER — ONDANSETRON HCL 8 MG PO TABS
ORAL_TABLET | ORAL | Status: AC
  Filled 2015-05-19: qty 1

## 2015-05-19 NOTE — Patient Instructions (Signed)
[facility] Discharge Instructions for Patients Receiving Chemotherapy  Today you received the following chemotherapy agents:  Velcade  To help prevent nausea and vomiting after your treatment, we encourage you to take your nausea medication as ordered per MD.   If you develop nausea and vomiting that is not controlled by your nausea medication, call the clinic.   BELOW ARE SYMPTOMS THAT SHOULD BE REPORTED IMMEDIATELY:  *FEVER GREATER THAN 100.5 F  *CHILLS WITH OR WITHOUT FEVER  NAUSEA AND VOMITING THAT IS NOT CONTROLLED WITH YOUR NAUSEA MEDICATION  *UNUSUAL SHORTNESS OF BREATH  *UNUSUAL BRUISING OR BLEEDING  TENDERNESS IN MOUTH AND THROAT WITH OR WITHOUT PRESENCE OF ULCERS  *URINARY PROBLEMS  *BOWEL PROBLEMS  UNUSUAL RASH Items with * indicate a potential emergency and should be followed up as soon as possible.  Feel free to call the clinic you have any questions or concerns. The clinic phone number is [phone].  Please show the CHEMO ALERT CARD at check-in to the Emergency Department and triage nurse.   

## 2015-05-20 ENCOUNTER — Other Ambulatory Visit: Payer: Self-pay | Admitting: Oncology

## 2015-05-24 ENCOUNTER — Other Ambulatory Visit: Payer: Self-pay | Admitting: Oncology

## 2015-05-25 ENCOUNTER — Ambulatory Visit: Payer: Self-pay

## 2015-05-25 ENCOUNTER — Other Ambulatory Visit: Payer: Self-pay

## 2015-05-26 ENCOUNTER — Ambulatory Visit (HOSPITAL_BASED_OUTPATIENT_CLINIC_OR_DEPARTMENT_OTHER): Payer: 59

## 2015-05-26 ENCOUNTER — Other Ambulatory Visit (HOSPITAL_BASED_OUTPATIENT_CLINIC_OR_DEPARTMENT_OTHER): Payer: 59

## 2015-05-26 VITALS — BP 114/62 | HR 105 | Temp 98.2°F | Resp 16

## 2015-05-26 DIAGNOSIS — Z5112 Encounter for antineoplastic immunotherapy: Secondary | ICD-10-CM

## 2015-05-26 DIAGNOSIS — E858 Other amyloidosis: Secondary | ICD-10-CM | POA: Diagnosis not present

## 2015-05-26 DIAGNOSIS — E8581 Light chain (AL) amyloidosis: Secondary | ICD-10-CM

## 2015-05-26 LAB — CBC WITH DIFFERENTIAL/PLATELET
BASO%: 0.7 % (ref 0.0–2.0)
BASOS ABS: 0 10*3/uL (ref 0.0–0.1)
EOS ABS: 0.1 10*3/uL (ref 0.0–0.5)
EOS%: 2.2 % (ref 0.0–7.0)
HEMATOCRIT: 34 % — AB (ref 34.8–46.6)
HEMOGLOBIN: 10.8 g/dL — AB (ref 11.6–15.9)
LYMPH#: 0.5 10*3/uL — AB (ref 0.9–3.3)
LYMPH%: 13.2 % — ABNORMAL LOW (ref 14.0–49.7)
MCH: 27.4 pg (ref 25.1–34.0)
MCHC: 31.9 g/dL (ref 31.5–36.0)
MCV: 86 fL (ref 79.5–101.0)
MONO#: 0.6 10*3/uL (ref 0.1–0.9)
MONO%: 17.2 % — AB (ref 0.0–14.0)
NEUT%: 66.7 % (ref 38.4–76.8)
NEUTROS ABS: 2.3 10*3/uL (ref 1.5–6.5)
Platelets: 279 10*3/uL (ref 145–400)
RBC: 3.95 10*6/uL (ref 3.70–5.45)
RDW: 20.5 % — AB (ref 11.2–14.5)
WBC: 3.5 10*3/uL — AB (ref 3.9–10.3)

## 2015-05-26 LAB — COMPREHENSIVE METABOLIC PANEL
ALBUMIN: 1.3 g/dL — AB (ref 3.5–5.0)
ALK PHOS: 97 U/L (ref 40–150)
ALT: 18 U/L (ref 0–55)
AST: 25 U/L (ref 5–34)
Anion Gap: 6 mEq/L (ref 3–11)
BUN: 11.6 mg/dL (ref 7.0–26.0)
CALCIUM: 8 mg/dL — AB (ref 8.4–10.4)
CO2: 26 mEq/L (ref 22–29)
Chloride: 107 mEq/L (ref 98–109)
Creatinine: 0.6 mg/dL (ref 0.6–1.1)
EGFR: >90 mL/min/{1.73_m2} (ref >90)
GLUCOSE: 95 mg/dL (ref 70–140)
POTASSIUM: 3.9 meq/L (ref 3.5–5.1)
SODIUM: 139 meq/L (ref 136–145)
TOTAL PROTEIN: 4.1 g/dL — AB (ref 6.4–8.3)

## 2015-05-26 MED ORDER — ONDANSETRON HCL 8 MG PO TABS
8.0000 mg | ORAL_TABLET | Freq: Once | ORAL | Status: AC
  Administered 2015-05-26: 8 mg via ORAL

## 2015-05-26 MED ORDER — ONDANSETRON HCL 8 MG PO TABS
ORAL_TABLET | ORAL | Status: AC
  Filled 2015-05-26: qty 1

## 2015-05-26 MED ORDER — BORTEZOMIB CHEMO SQ INJECTION 3.5 MG (2.5MG/ML)
1.5000 mg/m2 | Freq: Once | INTRAMUSCULAR | Status: AC
  Administered 2015-05-26: 2.25 mg via SUBCUTANEOUS
  Filled 2015-05-26: qty 2.25

## 2015-05-26 NOTE — Patient Instructions (Signed)
[facility] Discharge Instructions for Patients Receiving Chemotherapy  Today you received the following chemotherapy agents Velcade. To help prevent nausea and vomiting after your treatment, we encourage you to take your nausea medication as directed.  If you develop nausea and vomiting that is not controlled by your nausea medication, call the clinic.   BELOW ARE SYMPTOMS THAT SHOULD BE REPORTED IMMEDIATELY:  *FEVER GREATER THAN 100.5 F  *CHILLS WITH OR WITHOUT FEVER  NAUSEA AND VOMITING THAT IS NOT CONTROLLED WITH YOUR NAUSEA MEDICATION  *UNUSUAL SHORTNESS OF BREATH  *UNUSUAL BRUISING OR BLEEDING  TENDERNESS IN MOUTH AND THROAT WITH OR WITHOUT PRESENCE OF ULCERS  *URINARY PROBLEMS  *BOWEL PROBLEMS  UNUSUAL RASH Items with * indicate a potential emergency and should be followed up as soon as possible.  Feel free to call the clinic you have any questions or concerns. The clinic phone number is [phone].  Please show the CHEMO ALERT CARD at check-in to the Emergency Department and triage nurse.    

## 2015-05-26 NOTE — Progress Notes (Signed)
Dr. Alen Blew came to evaluate patient in infusion room because patient arrived to fusion shaking and thought she was cold so we covered her with mulitple blankets.  The shaking escalated from head to toe despite patient stating she felt well.  She had not experienced this prior to arrival to infusion clinic today.  After approximately 30 minutes the shaking resolved completely and MD made aware we were told to go ahead with Velcade injection today as planned. Patient agreed.

## 2015-05-29 LAB — KAPPA/LAMBDA LIGHT CHAINS
Kappa free light chain: 0.7 mg/dL (ref 0.33–1.94)
Kappa:Lambda Ratio: 2.41 — ABNORMAL HIGH (ref 0.26–1.65)
Lambda Free Lght Chn: 0.29 mg/dL — ABNORMAL LOW (ref 0.57–2.63)

## 2015-05-30 ENCOUNTER — Other Ambulatory Visit: Payer: Self-pay | Admitting: *Deleted

## 2015-05-30 MED ORDER — CYCLOPHOSPHAMIDE 50 MG PO TABS: ORAL_TABLET | ORAL | Status: DC

## 2015-06-01 ENCOUNTER — Ambulatory Visit (HOSPITAL_BASED_OUTPATIENT_CLINIC_OR_DEPARTMENT_OTHER): Payer: 59

## 2015-06-01 ENCOUNTER — Telehealth: Payer: Self-pay | Admitting: *Deleted

## 2015-06-01 ENCOUNTER — Ambulatory Visit (HOSPITAL_BASED_OUTPATIENT_CLINIC_OR_DEPARTMENT_OTHER): Payer: 59 | Admitting: Oncology

## 2015-06-01 ENCOUNTER — Other Ambulatory Visit (HOSPITAL_BASED_OUTPATIENT_CLINIC_OR_DEPARTMENT_OTHER): Payer: 59

## 2015-06-01 ENCOUNTER — Telehealth: Payer: Self-pay | Admitting: Oncology

## 2015-06-01 VITALS — BP 104/63 | HR 84 | Temp 97.5°F | Resp 18 | Ht 61.0 in | Wt 114.4 lb

## 2015-06-01 DIAGNOSIS — E858 Other amyloidosis: Secondary | ICD-10-CM

## 2015-06-01 DIAGNOSIS — R601 Generalized edema: Secondary | ICD-10-CM

## 2015-06-01 DIAGNOSIS — E8581 Light chain (AL) amyloidosis: Secondary | ICD-10-CM

## 2015-06-01 DIAGNOSIS — Z5112 Encounter for antineoplastic immunotherapy: Secondary | ICD-10-CM | POA: Diagnosis not present

## 2015-06-01 LAB — CBC WITH DIFFERENTIAL/PLATELET
BASO%: 0.2 % (ref 0.0–2.0)
BASOS ABS: 0 10*3/uL (ref 0.0–0.1)
EOS ABS: 0.1 10*3/uL (ref 0.0–0.5)
EOS%: 1.4 % (ref 0.0–7.0)
HCT: 32.8 % — ABNORMAL LOW (ref 34.8–46.6)
HEMOGLOBIN: 10.6 g/dL — AB (ref 11.6–15.9)
LYMPH%: 9.8 % — AB (ref 14.0–49.7)
MCH: 27.8 pg (ref 25.1–34.0)
MCHC: 32.3 g/dL (ref 31.5–36.0)
MCV: 86.1 fL (ref 79.5–101.0)
MONO#: 0.5 10*3/uL (ref 0.1–0.9)
MONO%: 10.2 % (ref 0.0–14.0)
NEUT#: 4 10*3/uL (ref 1.5–6.5)
NEUT%: 78.4 % — AB (ref 38.4–76.8)
Platelets: 187 10*3/uL (ref 145–400)
RBC: 3.81 10*6/uL (ref 3.70–5.45)
RDW: 18.4 % — AB (ref 11.2–14.5)
WBC: 5.1 10*3/uL (ref 3.9–10.3)
lymph#: 0.5 10*3/uL — ABNORMAL LOW (ref 0.9–3.3)

## 2015-06-01 LAB — COMPREHENSIVE METABOLIC PANEL
ALK PHOS: 84 U/L (ref 40–150)
ALT: 11 U/L (ref 0–55)
AST: 23 U/L (ref 5–34)
Albumin: 1.1 g/dL — ABNORMAL LOW (ref 3.5–5.0)
Anion Gap: 8 mEq/L (ref 3–11)
BUN: 15.6 mg/dL (ref 7.0–26.0)
CHLORIDE: 107 meq/L (ref 98–109)
CO2: 24 meq/L (ref 22–29)
Calcium: 8.1 mg/dL — ABNORMAL LOW (ref 8.4–10.4)
Creatinine: 0.6 mg/dL (ref 0.6–1.1)
GLUCOSE: 99 mg/dL (ref 70–140)
POTASSIUM: 4.5 meq/L (ref 3.5–5.1)
SODIUM: 139 meq/L (ref 136–145)
Total Bilirubin: <0.3 mg/dL (ref 0.20–1.20)
Total Protein: 4.2 g/dL — ABNORMAL LOW (ref 6.4–8.3)

## 2015-06-01 MED ORDER — ONDANSETRON HCL 8 MG PO TABS
8.0000 mg | ORAL_TABLET | Freq: Once | ORAL | Status: AC
  Administered 2015-06-01: 8 mg via ORAL

## 2015-06-01 MED ORDER — ONDANSETRON HCL 8 MG PO TABS
ORAL_TABLET | ORAL | Status: AC
  Filled 2015-06-01: qty 1

## 2015-06-01 MED ORDER — BORTEZOMIB CHEMO SQ INJECTION 3.5 MG (2.5MG/ML)
1.5000 mg/m2 | Freq: Once | INTRAMUSCULAR | Status: AC
  Administered 2015-06-01: 2.25 mg via SUBCUTANEOUS
  Filled 2015-06-01: qty 2.25

## 2015-06-01 MED ORDER — CYCLOPHOSPHAMIDE 50 MG PO TABS: ORAL_TABLET | ORAL | Status: DC

## 2015-06-01 NOTE — Progress Notes (Signed)
Hematology and Oncology Follow Up Visit  Anna Calderon [id number] Nov 02, 1960 54 y.o. 06/01/2015 1:14 PM Pcp Not In SystemNo ref. 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 Her disease is involving the bone marrow as well as the kidneys. She has potentially cardiac and neurological involvement.   Prior Therapy: She is status post kidney biopsy done on 08/10/2014 and showed positive staining for amyloidosis.  She is status post bone marrow biopsy on 09/20/2014 which showed 19% plasma cell infiltration suggesting plasma cell neoplasm.   Current therapy:   Velcade, Cytoxan and dexamethasone as follows:  Velcade at 1.5 mg/m subcutaneously on a weekly basis. Cyclophosphamide 300 mg/m (total dose of close to 450 mg) on a weekly basis. Dexamethasone at 20 mg by mouth (reduced from 40 mg because of her fluid retention) on a weekly basis.   Therapy started on 11/24/2014.  Interim History:   Anna Calderon presents today for a follow-up visit with her daughter. Since her last visit, she reports no recent complaints. She reports decrease in her lower extremity edema and her generalized anasarca. She continues to be on diuretics which have helped her volume. She was evaluated by nephrology and cardiology at [facility] in the last month and appears in her condition is stable.   She has tolerated this treatment without any major complications. She did have 1 episode of chills and shakes before the treatment last week which have resolved spontaneously. She does report some mild fatigue and anorexia associated with treatment. She does not report any increase in GI complications or neuropathy.  She does not report any headaches, blurry vision, syncope or seizures. She denied any chest pain or shortness of breath.  She did not report any easy bruisability or  petechiae.  She does not report any fevers, chills, sweats, weight loss or abdominal discomfort. Did not report any nausea, vomiting. She does not report any urgency or hesitancy. She did not report any hematuria. She did not report any skeletal pain, lymphadenopathy or easy bruisability. The remaining review of systems unremarkable  Medications: I have reviewed the patient's current medications.  Current Outpatient Prescriptions  Medication Sig Dispense Refill  . acyclovir (ZOVIRAX) 400 MG tablet TAKE 1 TABLET BY MOUTH TWICE DAILY 60 tablet 0  . acyclovir (ZOVIRAX) 400 MG tablet TAKE 1 TABLET BY MOUTH TWICE DAILY 60 tablet 0  . acyclovir (ZOVIRAX) 400 MG tablet TAKE 1 TABLET BY MOUTH TWICE DAILY 60 tablet 0  . aspirin 325 MG tablet Take 325 mg by mouth once.    . Cholecalciferol (VITAMIN D3) 1000 UNITS CAPS Take 1 tablet by mouth daily.    . cyclophosphamide (CYTOXAN) 50 MG tablet Take 9 tablets weekly on an empty stomach on the day of your chemotherapy. 36 tablet 0  . dexamethasone (DECADRON) 4 MG tablet Take 5 tablets every week with chemotherapy. 60 tablet 3  . midodrine (PROAMATINE) 5 MG tablet TAKE 1 TABLET(5 MG) BY MOUTH TWICE DAILY 60 tablet 0  . oxyCODONE (OXY IR/ROXICODONE) 5 MG immediate release tablet Take 1 tablet (5 mg total) by mouth every 4 (four) hours as needed for moderate pain. 30 tablet 0  . prochlorperazine (COMPAZINE) 10 MG tablet Take 1 tablet (10 mg total) by mouth every 6 (six) hours as needed for nausea or vomiting. 30 tablet 0   No current facility-administered medications for this visit.  Allergies: No Known Allergies  Past Medical History, Surgical history, Social history, and Family History were reviewed and updated.   Physical Exam: Blood pressure 104/63, pulse 84, temperature 97.5 F (36.4 C), temperature source Oral, resp. rate 18, height _0  (1.549 m), weight 114 lb 6.4 oz (51.891 kg), SpO2 98 %.  ECOG: 1 General appearance: alert and cooperative   well-appearing woman without distress. Head: Normocephalic, without obvious abnormality  No thrush noted. Neck: no adenopathy Lymph nodes: Cervical, supraclavicular, and axillary nodes normal. Heart:regular rate and rhythm, S1, S2 normal, no murmur, click, rub or gallop Lung:chest clear, no wheezing, rales, normal symmetric air entry. No dullness to percussion. Abdomin: soft, non-tender, without masses or organomegaly.  No rebound or guarding. No ascites noted. EXT: Trace edema noted much improved from previously. Skin: No ecchymosis or petechiae.  Lab Results: Lab Results  Component Value Date   WBC 5.1 06/01/2015   HGB 10.6* 06/01/2015   HCT 32.8* 06/01/2015   MCV 86.1 06/01/2015   PLT 187 06/01/2015     Chemistry      Component Value Date/Time   NA 139 05/26/2015 1030   NA 140 03/13/2015 0531   K 3.9 05/26/2015 1030   K 3.5 03/13/2015 0531   CL 107 03/13/2015 0531   CO2 26 05/26/2015 1030   CO2 29 03/13/2015 0531   BUN 11.6 05/26/2015 1030   BUN 18 03/13/2015 0531   CREATININE 0.6 05/26/2015 1030   CREATININE 0.68 03/13/2015 0531      Component Value Date/Time   CALCIUM 8.0* 05/26/2015 1030   CALCIUM 7.8* 03/13/2015 0531   ALKPHOS 97 05/26/2015 1030   ALKPHOS 66 03/07/2015 2008   AST 25 05/26/2015 1030   AST 23 03/07/2015 2008   ALT 18 05/26/2015 1030   ALT 13* 03/07/2015 2008   BILITOT <0.30 05/26/2015 1030   BILITOT 0.8 03/07/2015 2008         Results for Anna Calderon (MRN [id number]) as of 06/01/2015 12:45  Ref. Range 03/16/2015 09:56 04/20/2015 09:50 05/26/2015 10:30  Kappa free light chain Latest Ref Range: 0.33-1.94 mg/dL 1.12 0.65 0.70  Lambda Free Lght Chn Latest Ref Range: 0.57-2.63 mg/dL 0.44 (L) 0.54 (L) 0.29 (L)  Kappa:Lambda Ratio Latest Ref Range: 0.26-1.65  2.55 (H) 1.20 2.41 (H)      Impression and Plan:   54 year old woman with the following issues:  1. AL amyloidosis: Presented with a plasma cell disorder manifesting itself with bone  marrow involvement with 19% plasma cells that are lambda light chain specific. Her free lambda light chain in the serum are elevated at 29 with At the lambda ratio depleted is 0.05. Staging CT scan did not show any evidence of clear-cut other organ involvement. 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   She is currently receiving VCD without any major complications. Her protein studies from 05/26/2015  reviewed today reviewed today and continues to show normalization of her lambda free light chains. Her total protein and albumin is also correcting. Her clinical status have improved with improved edema.  The plan is to continue with the current dose and regimen and she will follow-up at [facility] regarding her amyloidosis. I anticipate that she will need to be on this regimen through January 2017.   Further therapy beyond that we'll be per the discretion of the Amyloidosis team that she has a [facility]. Stem cell transplant would be also a consideration at this time.  2. Generalized anasarca: She is currently on diuretics with good response. This is managed by cardiology and nephrology and appears to be euvolemic at this time.  3. Antiemetics: Continue Compazine as needed. No recent nausea or vomiting.  4. VZV prophylaxis: Continue acyclovir as prescribed. No complications related to this issue.  5. Nutrition: Nutrition status has improved at this time. Total 13 and albumin has improved.  6. Fever and sepsis: This have resolved at this time and she has finished a course of anti-biotics prophylactically. No evidence of recurrence from that standpoint.  7. Cardiac complications of amyloid: She is following up with cardiology at [facility] regarding this issue. Her cardiac status is stable.  8. Follow-up: Will be in one week for the next treatment. I have scheduled her chemotherapy for the next month and a follow-up for clinical  evaluation on on 07/06/2015.   Zola Button, MD  12/8/20161:14 PM

## 2015-06-01 NOTE — Patient Instructions (Addendum)
[facility] Discharge Instructions for Patients Receiving Chemotherapy  Today you received the following chemotherapy agents Velcade.  To help prevent nausea and vomiting after your treatment, we encourage you to take your nausea medication as directed.  If you develop nausea and vomiting that is not controlled by your nausea medication, call the clinic.   BELOW ARE SYMPTOMS THAT SHOULD BE REPORTED IMMEDIATELY:  *FEVER GREATER THAN 100.5 F  *CHILLS WITH OR WITHOUT FEVER  NAUSEA AND VOMITING THAT IS NOT CONTROLLED WITH YOUR NAUSEA MEDICATION  *UNUSUAL SHORTNESS OF BREATH  *UNUSUAL BRUISING OR BLEEDING  TENDERNESS IN MOUTH AND THROAT WITH OR WITHOUT PRESENCE OF ULCERS  *URINARY PROBLEMS  *BOWEL PROBLEMS  UNUSUAL RASH Items with * indicate a potential emergency and should be followed up as soon as possible.  Feel free to call the clinic should you have any questions or concerns. The clinic phone number is [phone].  Please show the CHEMO ALERT CARD at check-in to the Emergency Department and triage nurse.   

## 2015-06-01 NOTE — Telephone Encounter (Signed)
per pof to sch pt appt-gave pt copy of avs °

## 2015-06-01 NOTE — Telephone Encounter (Signed)
Per staff message and POF I have scheduled appts. Advised scheduler of appts and to move labs. JMW  

## 2015-06-01 NOTE — Telephone Encounter (Signed)
per pof to sch pt appt-Sent MW email to sch trmt-pt to get updated copy b4 leaving

## 2015-06-08 ENCOUNTER — Ambulatory Visit (HOSPITAL_BASED_OUTPATIENT_CLINIC_OR_DEPARTMENT_OTHER): Payer: 59

## 2015-06-08 ENCOUNTER — Other Ambulatory Visit (HOSPITAL_BASED_OUTPATIENT_CLINIC_OR_DEPARTMENT_OTHER): Payer: 59

## 2015-06-08 ENCOUNTER — Other Ambulatory Visit: Payer: Self-pay

## 2015-06-08 VITALS — BP 114/65 | HR 86 | Temp 98.0°F | Resp 20

## 2015-06-08 DIAGNOSIS — Z5112 Encounter for antineoplastic immunotherapy: Secondary | ICD-10-CM | POA: Diagnosis not present

## 2015-06-08 DIAGNOSIS — E8581 Light chain (AL) amyloidosis: Secondary | ICD-10-CM

## 2015-06-08 DIAGNOSIS — E858 Other amyloidosis: Secondary | ICD-10-CM | POA: Diagnosis not present

## 2015-06-08 LAB — COMPREHENSIVE METABOLIC PANEL
ALBUMIN: 1.2 g/dL — AB (ref 3.5–5.0)
ALK PHOS: 80 U/L (ref 40–150)
ALT: 12 U/L (ref 0–55)
ANION GAP: 6 meq/L (ref 3–11)
AST: 17 U/L (ref 5–34)
BUN: 16.8 mg/dL (ref 7.0–26.0)
CALCIUM: 8.2 mg/dL — AB (ref 8.4–10.4)
CO2: 25 mEq/L (ref 22–29)
Chloride: 109 mEq/L (ref 98–109)
Creatinine: 0.6 mg/dL (ref 0.6–1.1)
Glucose: 94 mg/dl (ref 70–140)
Potassium: 4 mEq/L (ref 3.5–5.1)
Sodium: 139 mEq/L (ref 136–145)
TOTAL PROTEIN: 4.3 g/dL — AB (ref 6.4–8.3)

## 2015-06-08 LAB — CBC WITH DIFFERENTIAL/PLATELET
BASO%: 0 % (ref 0.0–2.0)
Basophils Absolute: 0 10*3/uL (ref 0.0–0.1)
EOS ABS: 0.1 10*3/uL (ref 0.0–0.5)
EOS%: 1.8 % (ref 0.0–7.0)
HCT: 31.8 % — ABNORMAL LOW (ref 34.8–46.6)
HEMOGLOBIN: 10 g/dL — AB (ref 11.6–15.9)
LYMPH%: 8 % — AB (ref 14.0–49.7)
MCH: 27 pg (ref 25.1–34.0)
MCHC: 31.4 g/dL — ABNORMAL LOW (ref 31.5–36.0)
MCV: 85.9 fL (ref 79.5–101.0)
MONO#: 0.6 10*3/uL (ref 0.1–0.9)
MONO%: 12.5 % (ref 0.0–14.0)
NEUT%: 77.7 % — ABNORMAL HIGH (ref 38.4–76.8)
NEUTROS ABS: 3.5 10*3/uL (ref 1.5–6.5)
PLATELETS: 320 10*3/uL (ref 145–400)
RBC: 3.7 10*6/uL (ref 3.70–5.45)
RDW: 18.4 % — ABNORMAL HIGH (ref 11.2–14.5)
WBC: 4.5 10*3/uL (ref 3.9–10.3)
lymph#: 0.4 10*3/uL — ABNORMAL LOW (ref 0.9–3.3)

## 2015-06-08 MED ORDER — ONDANSETRON HCL 8 MG PO TABS
8.0000 mg | ORAL_TABLET | Freq: Once | ORAL | Status: AC
Start: 2015-06-08 — End: 2015-06-08
  Administered 2015-06-08: 8 mg via ORAL

## 2015-06-08 MED ORDER — BORTEZOMIB CHEMO SQ INJECTION 3.5 MG (2.5MG/ML)
1.5000 mg/m2 | Freq: Once | INTRAMUSCULAR | Status: AC
  Administered 2015-06-08: 2.25 mg via SUBCUTANEOUS
  Filled 2015-06-08: qty 2.25

## 2015-06-08 MED ORDER — ONDANSETRON HCL 8 MG PO TABS
ORAL_TABLET | ORAL | Status: AC
  Filled 2015-06-08: qty 1

## 2015-06-08 NOTE — Patient Instructions (Signed)
[facility] Discharge Instructions for Patients Receiving Chemotherapy  Today you received the following chemotherapy agents:  Velcade  To help prevent nausea and vomiting after your treatment, we encourage you to take your nausea medication as ordered per MD.   If you develop nausea and vomiting that is not controlled by your nausea medication, call the clinic.   BELOW ARE SYMPTOMS THAT SHOULD BE REPORTED IMMEDIATELY:  *FEVER GREATER THAN 100.5 F  *CHILLS WITH OR WITHOUT FEVER  NAUSEA AND VOMITING THAT IS NOT CONTROLLED WITH YOUR NAUSEA MEDICATION  *UNUSUAL SHORTNESS OF BREATH  *UNUSUAL BRUISING OR BLEEDING  TENDERNESS IN MOUTH AND THROAT WITH OR WITHOUT PRESENCE OF ULCERS  *URINARY PROBLEMS  *BOWEL PROBLEMS  UNUSUAL RASH Items with * indicate a potential emergency and should be followed up as soon as possible.  Feel free to call the clinic you have any questions or concerns. The clinic phone number is [phone].  Please show the CHEMO ALERT CARD at check-in to the Emergency Department and triage nurse.   

## 2015-06-13 ENCOUNTER — Telehealth: Payer: Self-pay | Admitting: Oncology

## 2015-06-13 NOTE — Telephone Encounter (Signed)
cld pt and left message to adv lab time was @ 11:45

## 2015-06-15 ENCOUNTER — Ambulatory Visit (HOSPITAL_BASED_OUTPATIENT_CLINIC_OR_DEPARTMENT_OTHER): Payer: 59

## 2015-06-15 ENCOUNTER — Other Ambulatory Visit: Payer: Self-pay

## 2015-06-15 ENCOUNTER — Other Ambulatory Visit (HOSPITAL_BASED_OUTPATIENT_CLINIC_OR_DEPARTMENT_OTHER): Payer: 59

## 2015-06-15 VITALS — BP 113/75 | HR 78 | Temp 97.0°F | Resp 18

## 2015-06-15 DIAGNOSIS — E858 Other amyloidosis: Secondary | ICD-10-CM | POA: Diagnosis not present

## 2015-06-15 DIAGNOSIS — Z5112 Encounter for antineoplastic immunotherapy: Secondary | ICD-10-CM | POA: Diagnosis not present

## 2015-06-15 DIAGNOSIS — E8581 Light chain (AL) amyloidosis: Secondary | ICD-10-CM

## 2015-06-15 LAB — COMPREHENSIVE METABOLIC PANEL
ALBUMIN: 1.3 g/dL — AB (ref 3.5–5.0)
ALK PHOS: 67 U/L (ref 40–150)
ALT: 14 U/L (ref 0–55)
AST: 20 U/L (ref 5–34)
Anion Gap: 4 mEq/L (ref 3–11)
BUN: 10.8 mg/dL (ref 7.0–26.0)
CO2: 27 mEq/L (ref 22–29)
Calcium: 7.9 mg/dL — ABNORMAL LOW (ref 8.4–10.4)
Chloride: 109 mEq/L (ref 98–109)
Creatinine: 0.6 mg/dL (ref 0.6–1.1)
EGFR: >90 mL/min/{1.73_m2} (ref >90)
GLUCOSE: 91 mg/dL (ref 70–140)
POTASSIUM: 3.8 meq/L (ref 3.5–5.1)
SODIUM: 140 meq/L (ref 136–145)
TOTAL PROTEIN: 4.2 g/dL — AB (ref 6.4–8.3)
Total Bilirubin: <0.3 mg/dL (ref 0.20–1.20)

## 2015-06-15 LAB — CBC WITH DIFFERENTIAL/PLATELET
BASO%: 0.5 % (ref 0.0–2.0)
Basophils Absolute: 0 10*3/uL (ref 0.0–0.1)
EOS ABS: 0 10*3/uL (ref 0.0–0.5)
EOS%: 0.9 % (ref 0.0–7.0)
HCT: 31.8 % — ABNORMAL LOW (ref 34.8–46.6)
HEMOGLOBIN: 10.2 g/dL — AB (ref 11.6–15.9)
LYMPH%: 11.2 % — AB (ref 14.0–49.7)
MCH: 27.3 pg (ref 25.1–34.0)
MCHC: 32.1 g/dL (ref 31.5–36.0)
MCV: 85.1 fL (ref 79.5–101.0)
MONO#: 0.6 10*3/uL (ref 0.1–0.9)
MONO%: 11.8 % (ref 0.0–14.0)
NEUT%: 75.6 % (ref 38.4–76.8)
NEUTROS ABS: 3.6 10*3/uL (ref 1.5–6.5)
Platelets: 291 10*3/uL (ref 145–400)
RBC: 3.74 10*6/uL (ref 3.70–5.45)
RDW: 20.1 % — AB (ref 11.2–14.5)
WBC: 4.7 10*3/uL (ref 3.9–10.3)
lymph#: 0.5 10*3/uL — ABNORMAL LOW (ref 0.9–3.3)

## 2015-06-15 MED ORDER — ONDANSETRON HCL 8 MG PO TABS
ORAL_TABLET | ORAL | Status: AC
  Filled 2015-06-15: qty 1

## 2015-06-15 MED ORDER — BORTEZOMIB CHEMO SQ INJECTION 3.5 MG (2.5MG/ML)
1.5000 mg/m2 | Freq: Once | INTRAMUSCULAR | Status: AC
  Administered 2015-06-15: 2.25 mg via SUBCUTANEOUS
  Filled 2015-06-15: qty 2.25

## 2015-06-15 MED ORDER — ONDANSETRON HCL 8 MG PO TABS
8.0000 mg | ORAL_TABLET | Freq: Once | ORAL | Status: AC
  Administered 2015-06-15: 8 mg via ORAL

## 2015-06-15 NOTE — Progress Notes (Signed)
Pt complains of right ear "fullness". Patient reports sinus drainage for the past week. Denies pain but endorses fullness. Pt reports that the hearing in her left ear sound "muffled." Selena Lesser NP notified. Patient encouraged to see her PCP/Urgent care for issue with ear.

## 2015-06-15 NOTE — Patient Instructions (Signed)
[facility] Discharge Instructions for Patients Receiving Chemotherapy  Today you received the following chemotherapy agents:  Velcade  To help prevent nausea and vomiting after your treatment, we encourage you to take your nausea medication as ordered per MD.   If you develop nausea and vomiting that is not controlled by your nausea medication, call the clinic.   BELOW ARE SYMPTOMS THAT SHOULD BE REPORTED IMMEDIATELY:  *FEVER GREATER THAN 100.5 F  *CHILLS WITH OR WITHOUT FEVER  NAUSEA AND VOMITING THAT IS NOT CONTROLLED WITH YOUR NAUSEA MEDICATION  *UNUSUAL SHORTNESS OF BREATH  *UNUSUAL BRUISING OR BLEEDING  TENDERNESS IN MOUTH AND THROAT WITH OR WITHOUT PRESENCE OF ULCERS  *URINARY PROBLEMS  *BOWEL PROBLEMS  UNUSUAL RASH Items with * indicate a potential emergency and should be followed up as soon as possible.  Feel free to call the clinic you have any questions or concerns. The clinic phone number is [phone].  Please show the CHEMO ALERT CARD at check-in to the Emergency Department and triage nurse.   

## 2015-06-21 ENCOUNTER — Other Ambulatory Visit: Payer: Self-pay | Admitting: Obstetrics and Gynecology

## 2015-06-21 ENCOUNTER — Telehealth: Payer: Self-pay | Admitting: Oncology

## 2015-06-21 NOTE — Telephone Encounter (Signed)
S/W PT'S DAUGHTER, RE 12/29 LAB APPT TIME. aDVISED HER THAT APPT TIME IS 11.45AM AND TO COLLECT AN NEW CALENDAR THEN.

## 2015-06-22 ENCOUNTER — Other Ambulatory Visit (HOSPITAL_BASED_OUTPATIENT_CLINIC_OR_DEPARTMENT_OTHER): Payer: 59

## 2015-06-22 ENCOUNTER — Ambulatory Visit (HOSPITAL_BASED_OUTPATIENT_CLINIC_OR_DEPARTMENT_OTHER): Payer: 59

## 2015-06-22 ENCOUNTER — Other Ambulatory Visit: Payer: Self-pay

## 2015-06-22 VITALS — BP 97/58 | HR 89 | Temp 98.2°F | Resp 16

## 2015-06-22 DIAGNOSIS — E8581 Light chain (AL) amyloidosis: Secondary | ICD-10-CM

## 2015-06-22 DIAGNOSIS — Z5112 Encounter for antineoplastic immunotherapy: Secondary | ICD-10-CM

## 2015-06-22 DIAGNOSIS — E858 Other amyloidosis: Secondary | ICD-10-CM | POA: Diagnosis not present

## 2015-06-22 LAB — CBC WITH DIFFERENTIAL/PLATELET
BASO%: 0.4 % (ref 0.0–2.0)
BASOS ABS: 0 10*3/uL (ref 0.0–0.1)
EOS%: 1.2 % (ref 0.0–7.0)
Eosinophils Absolute: 0.1 10*3/uL (ref 0.0–0.5)
HCT: 33.5 % — ABNORMAL LOW (ref 34.8–46.6)
HGB: 10.9 g/dL — ABNORMAL LOW (ref 11.6–15.9)
LYMPH%: 9.4 % — AB (ref 14.0–49.7)
MCH: 27.8 pg (ref 25.1–34.0)
MCHC: 32.7 g/dL (ref 31.5–36.0)
MCV: 85.1 fL (ref 79.5–101.0)
MONO#: 0.7 10*3/uL (ref 0.1–0.9)
MONO%: 11.3 % (ref 0.0–14.0)
NEUT#: 4.7 10*3/uL (ref 1.5–6.5)
NEUT%: 77.7 % — AB (ref 38.4–76.8)
PLATELETS: 252 10*3/uL (ref 145–400)
RBC: 3.94 10*6/uL (ref 3.70–5.45)
RDW: 19.8 % — ABNORMAL HIGH (ref 11.2–14.5)
WBC: 6.1 10*3/uL (ref 3.9–10.3)
lymph#: 0.6 10*3/uL — ABNORMAL LOW (ref 0.9–3.3)

## 2015-06-22 LAB — COMPREHENSIVE METABOLIC PANEL
ALT: 12 U/L (ref 0–55)
ANION GAP: 4 meq/L (ref 3–11)
AST: 18 U/L (ref 5–34)
Albumin: 1.3 g/dL — ABNORMAL LOW (ref 3.5–5.0)
Alkaline Phosphatase: 74 U/L (ref 40–150)
BUN: 12.5 mg/dL (ref 7.0–26.0)
CHLORIDE: 108 meq/L (ref 98–109)
CO2: 26 meq/L (ref 22–29)
Calcium: 8.2 mg/dL — ABNORMAL LOW (ref 8.4–10.4)
Creatinine: 0.6 mg/dL (ref 0.6–1.1)
Glucose: 85 mg/dl (ref 70–140)
POTASSIUM: 4.2 meq/L (ref 3.5–5.1)
SODIUM: 138 meq/L (ref 136–145)
TOTAL PROTEIN: 4.3 g/dL — AB (ref 6.4–8.3)

## 2015-06-22 LAB — CYTOLOGY - PAP

## 2015-06-22 MED ORDER — ONDANSETRON HCL 8 MG PO TABS
ORAL_TABLET | ORAL | Status: AC
  Filled 2015-06-22: qty 1

## 2015-06-22 MED ORDER — ONDANSETRON HCL 8 MG PO TABS
8.0000 mg | ORAL_TABLET | Freq: Once | ORAL | Status: AC
  Administered 2015-06-22: 8 mg via ORAL

## 2015-06-22 MED ORDER — BORTEZOMIB CHEMO SQ INJECTION 3.5 MG (2.5MG/ML)
1.5000 mg/m2 | Freq: Once | INTRAMUSCULAR | Status: AC
  Administered 2015-06-22: 2.25 mg via SUBCUTANEOUS
  Filled 2015-06-22: qty 2.25

## 2015-06-22 NOTE — Patient Instructions (Signed)
[facility] Discharge Instructions for Patients Receiving Chemotherapy  Today you received the following chemotherapy agents:  Velcade  To help prevent nausea and vomiting after your treatment, we encourage you to take your nausea medication as prescribed.   If you develop nausea and vomiting that is not controlled by your nausea medication, call the clinic.   BELOW ARE SYMPTOMS THAT SHOULD BE REPORTED IMMEDIATELY:  *FEVER GREATER THAN 100.5 F  *CHILLS WITH OR WITHOUT FEVER  NAUSEA AND VOMITING THAT IS NOT CONTROLLED WITH YOUR NAUSEA MEDICATION  *UNUSUAL SHORTNESS OF BREATH  *UNUSUAL BRUISING OR BLEEDING  TENDERNESS IN MOUTH AND THROAT WITH OR WITHOUT PRESENCE OF ULCERS  *URINARY PROBLEMS  *BOWEL PROBLEMS  UNUSUAL RASH Items with * indicate a potential emergency and should be followed up as soon as possible.  Feel free to call the clinic you have any questions or concerns. The clinic phone number is [phone].  Please show the CHEMO ALERT CARD at check-in to the Emergency Department and triage nurse.   

## 2015-06-27 ENCOUNTER — Encounter: Payer: Self-pay | Admitting: *Deleted

## 2015-06-27 ENCOUNTER — Other Ambulatory Visit: Payer: Self-pay | Admitting: Oncology

## 2015-06-27 DIAGNOSIS — H6690 Otitis media, unspecified, unspecified ear: Secondary | ICD-10-CM

## 2015-06-27 NOTE — Progress Notes (Signed)
Daughter Truman Hayward  Calling to request a referral to  an ENT. Patient finished z-pac and still has ear pain and drainage. pof done per dr Alen Blew. Daughter notified.

## 2015-06-29 ENCOUNTER — Other Ambulatory Visit (HOSPITAL_BASED_OUTPATIENT_CLINIC_OR_DEPARTMENT_OTHER): Payer: 59

## 2015-06-29 ENCOUNTER — Ambulatory Visit (HOSPITAL_BASED_OUTPATIENT_CLINIC_OR_DEPARTMENT_OTHER): Payer: 59

## 2015-06-29 ENCOUNTER — Other Ambulatory Visit: Payer: Self-pay

## 2015-06-29 DIAGNOSIS — Z5112 Encounter for antineoplastic immunotherapy: Secondary | ICD-10-CM | POA: Diagnosis not present

## 2015-06-29 DIAGNOSIS — E8581 Light chain (AL) amyloidosis: Secondary | ICD-10-CM

## 2015-06-29 DIAGNOSIS — E858 Other amyloidosis: Secondary | ICD-10-CM

## 2015-06-29 LAB — CBC WITH DIFFERENTIAL/PLATELET
BASO%: 0.2 % (ref 0.0–2.0)
Basophils Absolute: 0 10*3/uL (ref 0.0–0.1)
EOS ABS: 0.1 10*3/uL (ref 0.0–0.5)
EOS%: 1.5 % (ref 0.0–7.0)
HCT: 35.7 % (ref 34.8–46.6)
HEMOGLOBIN: 11.5 g/dL — AB (ref 11.6–15.9)
LYMPH%: 16 % (ref 14.0–49.7)
MCH: 27.4 pg (ref 25.1–34.0)
MCHC: 32.2 g/dL (ref 31.5–36.0)
MCV: 85 fL (ref 79.5–101.0)
MONO#: 0.5 10*3/uL (ref 0.1–0.9)
MONO%: 9.8 % (ref 0.0–14.0)
NEUT%: 72.5 % (ref 38.4–76.8)
NEUTROS ABS: 3.5 10*3/uL (ref 1.5–6.5)
Platelets: 253 10*3/uL (ref 145–400)
RBC: 4.2 10*6/uL (ref 3.70–5.45)
RDW: 18.9 % — AB (ref 11.2–14.5)
WBC: 4.8 10*3/uL (ref 3.9–10.3)
lymph#: 0.8 10*3/uL — ABNORMAL LOW (ref 0.9–3.3)

## 2015-06-29 LAB — COMPREHENSIVE METABOLIC PANEL
ALBUMIN: 1.5 g/dL — AB (ref 3.5–5.0)
ALK PHOS: 81 U/L (ref 40–150)
ALT: 19 U/L (ref 0–55)
AST: 23 U/L (ref 5–34)
Anion Gap: 7 mEq/L (ref 3–11)
BUN: 12.5 mg/dL (ref 7.0–26.0)
CO2: 25 meq/L (ref 22–29)
Calcium: 8.6 mg/dL (ref 8.4–10.4)
Chloride: 106 mEq/L (ref 98–109)
Creatinine: 0.7 mg/dL (ref 0.6–1.1)
EGFR: >90 mL/min/{1.73_m2} (ref >90)
GLUCOSE: 94 mg/dL (ref 70–140)
Potassium: 4.2 mEq/L (ref 3.5–5.1)
SODIUM: 138 meq/L (ref 136–145)
TOTAL PROTEIN: 4.7 g/dL — AB (ref 6.4–8.3)

## 2015-06-29 MED ORDER — BORTEZOMIB CHEMO SQ INJECTION 3.5 MG (2.5MG/ML)
1.5000 mg/m2 | Freq: Once | INTRAMUSCULAR | Status: AC
  Administered 2015-06-29: 2.25 mg via SUBCUTANEOUS
  Filled 2015-06-29: qty 2.25

## 2015-06-29 MED ORDER — ONDANSETRON HCL 8 MG PO TABS
ORAL_TABLET | ORAL | Status: AC
  Filled 2015-06-29: qty 1

## 2015-06-29 MED ORDER — ONDANSETRON HCL 8 MG PO TABS
8.0000 mg | ORAL_TABLET | Freq: Once | ORAL | Status: AC
  Administered 2015-06-29: 8 mg via ORAL

## 2015-06-29 NOTE — Patient Instructions (Signed)
[facility] Discharge Instructions for Patients Receiving Chemotherapy  Today you received the following chemotherapy agents: Velcade.  To help prevent nausea and vomiting after your treatment, we encourage you to take your nausea medication: Compazine 10 mg every 6 hours as needed.   If you develop nausea and vomiting that is not controlled by your nausea medication, call the clinic.   BELOW ARE SYMPTOMS THAT SHOULD BE REPORTED IMMEDIATELY:  *FEVER GREATER THAN 100.5 F  *CHILLS WITH OR WITHOUT FEVER  NAUSEA AND VOMITING THAT IS NOT CONTROLLED WITH YOUR NAUSEA MEDICATION  *UNUSUAL SHORTNESS OF BREATH  *UNUSUAL BRUISING OR BLEEDING  TENDERNESS IN MOUTH AND THROAT WITH OR WITHOUT PRESENCE OF ULCERS  *URINARY PROBLEMS  *BOWEL PROBLEMS  UNUSUAL RASH Items with * indicate a potential emergency and should be followed up as soon as possible.  Feel free to call the clinic you have any questions or concerns. The clinic phone number is [phone].  Please show the CHEMO ALERT CARD at check-in to the Emergency Department and triage nurse.   

## 2015-06-30 ENCOUNTER — Telehealth: Payer: Self-pay | Admitting: Oncology

## 2015-07-03 ENCOUNTER — Telehealth: Payer: Self-pay | Admitting: Oncology

## 2015-07-03 LAB — SPEP & IFE WITH QIG
Albumin ELP: 2 g/dL — ABNORMAL LOW (ref 3.8–4.8)
Alpha-1-Globulin: 0.2 g/dL (ref 0.2–0.3)
Alpha-2-Globulin: 1 g/dL — ABNORMAL HIGH (ref 0.5–0.9)
Beta 2: 0.2 g/dL (ref 0.2–0.5)
Beta Globulin: 0.2 g/dL — ABNORMAL LOW (ref 0.4–0.6)
GAMMA GLOBULIN: 0.2 g/dL — AB (ref 0.8–1.7)
IGA: 67 mg/dL — AB (ref 69–380)
IGM, SERUM: 41 mg/dL — AB (ref 52–322)
IgG (Immunoglobin G), Serum: 239 mg/dL — ABNORMAL LOW (ref 690–1700)
Total Protein, Serum Electrophoresis: 3.8 g/dL — ABNORMAL LOW (ref 6.1–8.1)

## 2015-07-03 LAB — KAPPA/LAMBDA LIGHT CHAINS
KAPPA FREE LGHT CHN: 0.92 mg/dL (ref 0.33–1.94)
Kappa:Lambda Ratio: 1.61 (ref 0.26–1.65)
LAMBDA FREE LGHT CHN: 0.57 mg/dL (ref 0.57–2.63)

## 2015-07-03 NOTE — Telephone Encounter (Signed)
SPOKE WITH DTR RE APPOINTMJENT WITH DR Janace Hoard 07/05/15 @ 2 PM. DTR GIVEN APPOINTMENT DATE/TIME/LOCATION/PHONE NUMBER. NO OTHER ORDERS PER 1/3 POF.

## 2015-07-03 NOTE — Telephone Encounter (Signed)
Medical records faxed to Dr. Janace Hoard office @ [phone]

## 2015-07-03 NOTE — Telephone Encounter (Signed)
SENT FAX COVERSHEET WITH COPY OF REFERRAL TO HIM TO SEND NOTES TO DR Janace Hoard AT [phone].

## 2015-07-04 ENCOUNTER — Other Ambulatory Visit: Payer: Self-pay | Admitting: Oncology

## 2015-07-06 ENCOUNTER — Ambulatory Visit (HOSPITAL_BASED_OUTPATIENT_CLINIC_OR_DEPARTMENT_OTHER): Payer: 59 | Admitting: Oncology

## 2015-07-06 ENCOUNTER — Ambulatory Visit (HOSPITAL_BASED_OUTPATIENT_CLINIC_OR_DEPARTMENT_OTHER): Payer: 59

## 2015-07-06 ENCOUNTER — Other Ambulatory Visit (HOSPITAL_BASED_OUTPATIENT_CLINIC_OR_DEPARTMENT_OTHER): Payer: 59

## 2015-07-06 VITALS — BP 109/70 | HR 80 | Temp 97.8°F | Resp 18 | Ht 61.0 in | Wt 113.3 lb

## 2015-07-06 DIAGNOSIS — E8581 Light chain (AL) amyloidosis: Secondary | ICD-10-CM

## 2015-07-06 DIAGNOSIS — Z5112 Encounter for antineoplastic immunotherapy: Secondary | ICD-10-CM

## 2015-07-06 DIAGNOSIS — E858 Other amyloidosis: Secondary | ICD-10-CM

## 2015-07-06 LAB — CBC WITH DIFFERENTIAL/PLATELET
BASO%: 0.6 % (ref 0.0–2.0)
Basophils Absolute: 0 10*3/uL (ref 0.0–0.1)
EOS%: 1.8 % (ref 0.0–7.0)
Eosinophils Absolute: 0.1 10*3/uL (ref 0.0–0.5)
HEMATOCRIT: 34 % — AB (ref 34.8–46.6)
HGB: 11.2 g/dL — ABNORMAL LOW (ref 11.6–15.9)
LYMPH#: 0.9 10*3/uL (ref 0.9–3.3)
LYMPH%: 17.1 % (ref 14.0–49.7)
MCH: 28.1 pg (ref 25.1–34.0)
MCHC: 33 g/dL (ref 31.5–36.0)
MCV: 85.2 fL (ref 79.5–101.0)
MONO#: 0.7 10*3/uL (ref 0.1–0.9)
MONO%: 12.9 % (ref 0.0–14.0)
NEUT#: 3.6 10*3/uL (ref 1.5–6.5)
NEUT%: 67.6 % (ref 38.4–76.8)
Platelets: 248 10*3/uL (ref 145–400)
RBC: 4 10*6/uL (ref 3.70–5.45)
RDW: 19.8 % — ABNORMAL HIGH (ref 11.2–14.5)
WBC: 5.3 10*3/uL (ref 3.9–10.3)

## 2015-07-06 LAB — COMPREHENSIVE METABOLIC PANEL
ALBUMIN: 1.5 g/dL — AB (ref 3.5–5.0)
ALK PHOS: 76 U/L (ref 40–150)
ALT: 20 U/L (ref 0–55)
ANION GAP: 7 meq/L (ref 3–11)
AST: 24 U/L (ref 5–34)
BUN: 12.6 mg/dL (ref 7.0–26.0)
CALCIUM: 8.2 mg/dL — AB (ref 8.4–10.4)
CO2: 24 mEq/L (ref 22–29)
Chloride: 108 mEq/L (ref 98–109)
Creatinine: 0.7 mg/dL (ref 0.6–1.1)
Glucose: 101 mg/dl (ref 70–140)
Potassium: 4.4 mEq/L (ref 3.5–5.1)
Sodium: 139 mEq/L (ref 136–145)
TOTAL PROTEIN: 4.6 g/dL — AB (ref 6.4–8.3)

## 2015-07-06 MED ORDER — ONDANSETRON HCL 8 MG PO TABS
ORAL_TABLET | ORAL | Status: AC
  Filled 2015-07-06: qty 1

## 2015-07-06 MED ORDER — ONDANSETRON HCL 8 MG PO TABS
8.0000 mg | ORAL_TABLET | Freq: Once | ORAL | Status: AC
  Administered 2015-07-06: 8 mg via ORAL

## 2015-07-06 MED ORDER — BORTEZOMIB CHEMO SQ INJECTION 3.5 MG (2.5MG/ML)
1.5000 mg/m2 | Freq: Once | INTRAMUSCULAR | Status: AC
  Administered 2015-07-06: 2.25 mg via SUBCUTANEOUS
  Filled 2015-07-06: qty 2.25

## 2015-07-06 NOTE — Progress Notes (Signed)
Hematology and Oncology Follow Up Visit  Anna Calderon [id number] 10-11-1960 55 y.o. 07/06/2015 11:56 AM Pcp Not In SystemNo ref. 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 Her disease is involving the bone marrow as well as the kidneys. She has potentially cardiac and neurological involvement.   Prior Therapy: She is status post kidney biopsy done on 08/10/2014 and showed positive staining for amyloidosis.  She is status post bone marrow biopsy on 09/20/2014 which showed 19% plasma cell infiltration suggesting plasma cell neoplasm.   Current therapy:   Velcade, Cytoxan and dexamethasone as follows:  Velcade at 1.5 mg/m subcutaneously on a weekly basis. Cyclophosphamide 300 mg/m (total dose of close to 450 mg) on a weekly basis. Dexamethasone at 20 mg by mouth (reduced from 40 mg because of her fluid retention) on a weekly basis.   Therapy started on 11/24/2014.  Interim History:   Anna Calderon presents today for a follow-up visit with her daughter. Since her last visit, she continues to improve dramatically and very close to baseline. She reports her lower extremity edema and generalized anasarca have resolved completely. She is no longer taking any diuretics at this time.   She has tolerated this treatment without any major complications. She did have 1 episode of chills and shakes which have resolved spontaneously and no other episodes since that time. She does report some mild fatigue but no increase in GI complications or neuropathy.  She does not report any headaches, blurry vision, syncope or seizures. She denied any chest pain or shortness of breath.  She did not report any easy bruisability or petechiae.  She does not report any fevers, chills, sweats, weight loss or abdominal discomfort. Did not report any nausea, vomiting. She does not report any  urgency or hesitancy. She did not report any hematuria. She did not report any skeletal pain, lymphadenopathy or easy bruisability. The remaining review of systems unremarkable  Medications: I have reviewed the patient's current medications.  Current Outpatient Prescriptions  Medication Sig Dispense Refill  . acyclovir (ZOVIRAX) 400 MG tablet TAKE 1 TABLET BY MOUTH TWICE DAILY 60 tablet 0  . aspirin 325 MG tablet Take 325 mg by mouth once.    . Cholecalciferol (VITAMIN D3) 1000 UNITS CAPS Take 1 tablet by mouth daily.    . cyclophosphamide (CYTOXAN) 50 MG tablet Take 9 tablets weekly on an empty stomach on the day of your chemotherapy. 36 tablet 0  . dexamethasone (DECADRON) 4 MG tablet Take 5 tablets every week with chemotherapy. 60 tablet 3  . midodrine (PROAMATINE) 5 MG tablet TAKE 1 TABLET(5 MG) BY MOUTH TWICE DAILY 60 tablet 0  . prochlorperazine (COMPAZINE) 10 MG tablet Take 1 tablet (10 mg total) by mouth every 6 (six) hours as needed for nausea or vomiting. 30 tablet 0   No current facility-administered medications for this visit.     Allergies: No Known Allergies  Past Medical History, Surgical history, Social history, and Family History were reviewed and updated.   Physical Exam: Blood pressure 109/70, pulse 80, temperature 97.8 F (36.6 C), temperature source Oral, resp. rate 18, height '5\' 1"'  (1.549 m), weight 113 lb 4.8 oz (51.393 kg), SpO2 100 %.  ECOG: 1 General appearance: alert and cooperative  appeared without any distress. Head: Normocephalic, without obvious abnormality  No thrush noted. Neck: no adenopathy Lymph nodes: Cervical, supraclavicular, and axillary nodes normal. Heart:regular  rate and rhythm, S1, S2 normal, no murmur, click, rub or gallop Lung:chest clear, no wheezing, rales, normal symmetric air entry. No dullness to percussion. Abdomin: soft, non-tender, without masses or organomegaly.  No no shifting dullness or ascites. EXT: Trace edema which have  nearly resolved at this time. Skin: No ecchymosis or petechiae.  Lab Results: Lab Results  Component Value Date   WBC 5.3 07/06/2015   HGB 11.2* 07/06/2015   HCT 34.0* 07/06/2015   MCV 85.2 07/06/2015   PLT 248 07/06/2015     Chemistry      Component Value Date/Time   NA 138 06/29/2015 1136   NA 140 03/13/2015 0531   K 4.2 06/29/2015 1136   K 3.5 03/13/2015 0531   CL 107 03/13/2015 0531   CO2 25 06/29/2015 1136   CO2 29 03/13/2015 0531   BUN 12.5 06/29/2015 1136   BUN 18 03/13/2015 0531   CREATININE 0.7 06/29/2015 1136   CREATININE 0.68 03/13/2015 0531      Component Value Date/Time   CALCIUM 8.6 06/29/2015 1136   CALCIUM 7.8* 03/13/2015 0531   ALKPHOS 81 06/29/2015 1136   ALKPHOS 66 03/07/2015 2008   AST 23 06/29/2015 1136   AST 23 03/07/2015 2008   ALT 19 06/29/2015 1136   ALT 13* 03/07/2015 2008   BILITOT <0.30 06/29/2015 1136   BILITOT 0.8 03/07/2015 2008        Results for Anna Calderon, Anna Calderon (MRN [id number]) as of 07/06/2015 11:46  Ref. Range 04/20/2015 09:50 05/26/2015 10:30 06/29/2015 11:36  Kappa free light chain Latest Ref Range: 0.33-1.94 mg/dL 0.65 0.70 0.92  Lambda Free Lght Chn Latest Ref Range: 0.57-2.63 mg/dL 0.54 (L) 0.29 (L) 0.57  Kappa:Lambda Ratio Latest Ref Range: 0.26-1.65  1.20 2.41 (H) 1.61       Impression and Plan:   55 year old woman with the following issues:  1. AL amyloidosis: Presented with a plasma cell disorder manifesting itself with bone marrow involvement with 19% plasma cells that are lambda light chain specific. Her free lambda light chain in the serum are elevated at 29 with At the lambda ratio depleted is 0.05. Staging CT scan did not show any evidence of clear-cut other organ involvement. 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   She is currently receiving VCD without any major complications.   Her protein studies from 06/29/2015 showed complete response at this time with normalization  of her light chain studies. Her total protein is improving as well as her albumin is close to normal.  The plan is to hold treatment for the time being and proceed with observation and close monitoring. We plan to reinstitute salvage therapy if she develops any progression.  2. Generalized anasarca: This have resolved which indicates normalization of her protein studies.  3. Antiemetics: Continue Compazine as needed. No recent nausea or vomiting.  4. VZV prophylaxis: Continue acyclovir as prescribed. This will be continued for at least 1 month.  5. Nutrition: Nutrition status has improved at this time. He continues to gain weight.  6. Cardiac complications of amyloid: She is following up with cardiology at [facility] regarding this issue. Her cardiac status is stable.  7. Follow-up: Will be in 4 weeks to repeat protein studies.  [facility], MD  1/12/201711:56 AM

## 2015-07-06 NOTE — Patient Instructions (Signed)
[facility] Discharge Instructions for Patients Receiving Chemotherapy  Today you received the following chemotherapy agents:  Velcade  To help prevent nausea and vomiting after your treatment, we encourage you to take your nausea medication as prescribed.   If you develop nausea and vomiting that is not controlled by your nausea medication, call the clinic.   BELOW ARE SYMPTOMS THAT SHOULD BE REPORTED IMMEDIATELY:  *FEVER GREATER THAN 100.5 F  *CHILLS WITH OR WITHOUT FEVER  NAUSEA AND VOMITING THAT IS NOT CONTROLLED WITH YOUR NAUSEA MEDICATION  *UNUSUAL SHORTNESS OF BREATH  *UNUSUAL BRUISING OR BLEEDING  TENDERNESS IN MOUTH AND THROAT WITH OR WITHOUT PRESENCE OF ULCERS  *URINARY PROBLEMS  *BOWEL PROBLEMS  UNUSUAL RASH Items with * indicate a potential emergency and should be followed up as soon as possible.  Feel free to call the clinic you have any questions or concerns. The clinic phone number is [phone].  Please show the CHEMO ALERT CARD at check-in to the Emergency Department and triage nurse.   

## 2015-07-10 ENCOUNTER — Telehealth: Payer: Self-pay | Admitting: Oncology

## 2015-07-10 NOTE — Telephone Encounter (Signed)
Left message confirming February appointments and mailed schedule.

## 2015-08-10 ENCOUNTER — Other Ambulatory Visit (HOSPITAL_BASED_OUTPATIENT_CLINIC_OR_DEPARTMENT_OTHER): Payer: 59

## 2015-08-10 DIAGNOSIS — E858 Other amyloidosis: Secondary | ICD-10-CM

## 2015-08-10 DIAGNOSIS — E8581 Light chain (AL) amyloidosis: Secondary | ICD-10-CM

## 2015-08-10 LAB — CBC WITH DIFFERENTIAL/PLATELET
BASO%: 0.6 % (ref 0.0–2.0)
BASOS ABS: 0 10*3/uL (ref 0.0–0.1)
EOS ABS: 0.2 10*3/uL (ref 0.0–0.5)
EOS%: 3.1 % (ref 0.0–7.0)
HCT: 37.3 % (ref 34.8–46.6)
HGB: 12.2 g/dL (ref 11.6–15.9)
LYMPH%: 17.1 % (ref 14.0–49.7)
MCH: 27.5 pg (ref 25.1–34.0)
MCHC: 32.6 g/dL (ref 31.5–36.0)
MCV: 84.4 fL (ref 79.5–101.0)
MONO#: 0.6 10*3/uL (ref 0.1–0.9)
MONO%: 10.3 % (ref 0.0–14.0)
NEUT#: 4.1 10*3/uL (ref 1.5–6.5)
NEUT%: 68.9 % (ref 38.4–76.8)
PLATELETS: 240 10*3/uL (ref 145–400)
RBC: 4.42 10*6/uL (ref 3.70–5.45)
RDW: 17.6 % — ABNORMAL HIGH (ref 11.2–14.5)
WBC: 6 10*3/uL (ref 3.9–10.3)
lymph#: 1 10*3/uL (ref 0.9–3.3)

## 2015-08-10 LAB — COMPREHENSIVE METABOLIC PANEL
ALBUMIN: 1.8 g/dL — AB (ref 3.5–5.0)
ALT: 19 U/L (ref 0–55)
ANION GAP: 6 meq/L (ref 3–11)
AST: 26 U/L (ref 5–34)
Alkaline Phosphatase: 83 U/L (ref 40–150)
BUN: 14.5 mg/dL (ref 7.0–26.0)
CALCIUM: 8.7 mg/dL (ref 8.4–10.4)
CHLORIDE: 109 meq/L (ref 98–109)
CO2: 27 mEq/L (ref 22–29)
CREATININE: 0.7 mg/dL (ref 0.6–1.1)
EGFR: >90 mL/min/{1.73_m2} (ref >90)
Glucose: 78 mg/dl (ref 70–140)
Potassium: 3.8 mEq/L (ref 3.5–5.1)
Sodium: 142 mEq/L (ref 136–145)
TOTAL PROTEIN: 4.8 g/dL — AB (ref 6.4–8.3)

## 2015-08-11 LAB — KAPPA/LAMBDA LIGHT CHAINS
Ig Kappa Free Light Chain: 10.72 mg/L (ref 3.30–19.40)
Ig Lambda Free Light Chain: 6.63 mg/L (ref 5.71–26.30)
KAPPA/LAMBDA FLC RATIO: 1.62 (ref 0.26–1.65)

## 2015-08-14 LAB — MULTIPLE MYELOMA PANEL, SERUM
ALBUMIN SERPL ELPH-MCNC: 1.7 g/dL — AB (ref 2.9–4.4)
Albumin/Glob SerPl: 0.8 (ref 0.7–1.7)
Alpha 1: 0.2 g/dL (ref 0.0–0.4)
Alpha2 Glob SerPl Elph-Mcnc: 1 g/dL (ref 0.4–1.0)
B-Globulin SerPl Elph-Mcnc: 0.7 g/dL (ref 0.7–1.3)
Gamma Glob SerPl Elph-Mcnc: 0.2 g/dL — ABNORMAL LOW (ref 0.4–1.8)
Globulin, Total: 2.2 g/dL (ref 2.2–3.9)
IGA/IMMUNOGLOBULIN A, SERUM: 33 mg/dL — AB (ref 87–352)
IGM (IMMUNOGLOBIN M), SRM: 15 mg/dL — AB (ref 26–217)
IgG, Qn, Serum: 277 mg/dL — ABNORMAL LOW (ref 700–1600)
TOTAL PROTEIN: 3.9 g/dL — AB (ref 6.0–8.5)

## 2015-08-17 ENCOUNTER — Telehealth: Payer: Self-pay | Admitting: Oncology

## 2015-08-17 ENCOUNTER — Other Ambulatory Visit: Payer: Self-pay

## 2015-08-17 ENCOUNTER — Ambulatory Visit (HOSPITAL_BASED_OUTPATIENT_CLINIC_OR_DEPARTMENT_OTHER): Payer: 59 | Admitting: Oncology

## 2015-08-17 VITALS — BP 118/69 | HR 87 | Temp 97.5°F | Resp 18 | Ht 61.0 in | Wt 115.8 lb

## 2015-08-17 DIAGNOSIS — E858 Other amyloidosis: Secondary | ICD-10-CM | POA: Diagnosis not present

## 2015-08-17 DIAGNOSIS — E8581 Light chain (AL) amyloidosis: Secondary | ICD-10-CM

## 2015-08-17 MED ORDER — ACYCLOVIR 400 MG PO TABS: 400.0000 mg | ORAL_TABLET | Freq: Two times a day (BID) | ORAL | Status: DC

## 2015-08-17 NOTE — Progress Notes (Signed)
Hematology and Oncology Follow Up Visit  Anna Calderon [id number] April 28, 1961 55 y.o. 08/17/2015 12:46 PM Pcp Not In SystemNo ref. 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 Her disease involed the bone marrow as well as the kidneys. She has potentially cardiac and neurological involvement.   Prior Therapy: She is status post kidney biopsy done on 08/10/2014 and showed positive staining for amyloidosis.  She is status post bone marrow biopsy on 09/20/2014 which showed 19% plasma cell infiltration suggesting plasma cell neoplasm.  Velcade, Cytoxan and dexamethasone as follows:  Velcade at 1.5 mg/m subcutaneously on a weekly basis. Cyclophosphamide 300 mg/m (total dose of close to 450 mg) on a weekly basis. Dexamethasone at 20 mg by mouth (reduced from 40 mg because of her fluid retention) on a weekly basis.   Therapy started on 11/24/2014 till 07/06/2015. Therapy discontinued given her complete response.  Current therapy: Observation and surveillance.    Interim History:   Anna Calderon presents today for a follow-up visit with her daughter. Since her last visit, she reports no recent complaints. She is reporting improvement in her health and close to being back to her baseline level of activity. She is no longer reporting generalized anasarca or edema. She is no longer taking diuretics at this time.   She has not reported any delayed complications from chemotherapy such as neuropathy or GI issues. Her appetite is excellent and have gained more weight.  She does not report any headaches, blurry vision, syncope or seizures. She denied any chest pain or shortness of breath.  She did not report any easy bruisability or petechiae.  She does not report any fevers, chills, sweats, weight loss or abdominal discomfort. Did not report any nausea, vomiting. She does not  report any urgency or hesitancy. She did not report any hematuria. She did not report any skeletal pain, lymphadenopathy or easy bruisability. The remaining review of systems unremarkable  Medications: I have reviewed the patient's current medications.  Current Outpatient Prescriptions  Medication Sig Dispense Refill  . acyclovir (ZOVIRAX) 400 MG tablet Take 1 tablet (400 mg total) by mouth 2 (two) times daily. 60 tablet 1  . aspirin 325 MG tablet Take 325 mg by mouth once.    . Cholecalciferol (VITAMIN D3) 1000 UNITS CAPS Take 1 tablet by mouth daily.    . cyclophosphamide (CYTOXAN) 50 MG tablet Take 9 tablets weekly on an empty stomach on the day of your chemotherapy. 36 tablet 0  . dexamethasone (DECADRON) 4 MG tablet Take 5 tablets every week with chemotherapy. 60 tablet 3  . midodrine (PROAMATINE) 5 MG tablet TAKE 1 TABLET(5 MG) BY MOUTH TWICE DAILY 60 tablet 0  . prochlorperazine (COMPAZINE) 10 MG tablet Take 1 tablet (10 mg total) by mouth every 6 (six) hours as needed for nausea or vomiting. 30 tablet 0   No current facility-administered medications for this visit.     Allergies: No Known Allergies  Past Medical History, Surgical history, Social history, and Family History were reviewed and updated.   Physical Exam: Blood pressure 118/69, pulse 87, temperature 97.5 F (36.4 C), temperature source Oral, resp. rate 18, height '5\' 1"'$  (1.549 m), weight 115 lb 12.8 oz (52.527 kg), SpO2 99 %.  ECOG: 1 General appearance: alert and cooperative well-appearing woman without distress. Head: Normocephalic, without obvious abnormality  No oral ulcers or lesions. Neck: no adenopathy Lymph nodes: Cervical,  supraclavicular, and axillary nodes normal. Heart:regular rate and rhythm, S1, S2 normal, no murmur, click, rub or gallop Lung:chest clear, no wheezing, rales, normal symmetric air entry.  Abdomin: soft, non-tender, without masses or organomegaly.  No shifting dullness or ascites. EXT:  Trace edema which have nearly resolved at this time. Skin: No ecchymosis or petechiae.  Lab Results: Lab Results  Component Value Date   WBC 6.0 08/10/2015   HGB 12.2 08/10/2015   HCT 37.3 08/10/2015   MCV 84.4 08/10/2015   PLT 240 08/10/2015     Chemistry      Component Value Date/Time   NA 142 08/10/2015 0954   NA 140 03/13/2015 0531   K 3.8 08/10/2015 0954   K 3.5 03/13/2015 0531   CL 107 03/13/2015 0531   CO2 27 08/10/2015 0954   CO2 29 03/13/2015 0531   BUN 14.5 08/10/2015 0954   BUN 18 03/13/2015 0531   CREATININE 0.7 08/10/2015 0954   CREATININE 0.68 03/13/2015 0531      Component Value Date/Time   CALCIUM 8.7 08/10/2015 0954   CALCIUM 7.8* 03/13/2015 0531   ALKPHOS 83 08/10/2015 0954   ALKPHOS 66 03/07/2015 2008   AST 26 08/10/2015 0954   AST 23 03/07/2015 2008   ALT 19 08/10/2015 0954   ALT 13* 03/07/2015 2008   BILITOT <0.30 08/10/2015 0954   BILITOT 0.8 03/07/2015 2008        Results for Anna, Calderon (MRN [id number]) as of 08/17/2015 12:49  Ref. Range 06/29/2015 11:36  Kappa free light chain Latest Ref Range: 0.33-1.94 mg/dL 0.92  Lambda Free Lght Chn Latest Ref Range: 0.57-2.63 mg/dL 0.57  Kappa:Lambda Ratio Latest Ref Range: 0.26-1.65  1.61    Results for Anna, Calderon (MRN [id number]) as of 08/17/2015 12:49  Ref. Range 08/10/2015 09:54  Ig Kappa Free Light Chain Latest Ref Range: 3.30-19.40 mg/L 10.72  Ig Lambda Free Light Chain Latest Ref Range: 5.71-26.30 mg/L 6.63  Kappa/Lambda FluidC Ratio Latest Ref Range: 0.26-1.65  1.62      Impression and Plan:   55 year old woman with the following issues:  1. AL amyloidosis: Presented with a plasma cell disorder manifesting itself with bone marrow involvement with 19% plasma cells that are lambda light chain specific. Her free lambda light chain in the serum are elevated at 29 with At the lambda ratio depleted is 0.05. Staging CT scan did not show any evidence of clear-cut other organ involvement. She  does have nephrotic range proteinuria as the sole presentation at this time.   She is status post VCD therapy completed in January 2017 with a complete response.   Her protein studies from 08/10/2015 were reviewed today and discussed with the patient and her daughter. His laboratory studies showed complete response at this time with normalization of her light chain studies. Her total protein is improving as well as her albumin is close to normal.  The plan is to continue with observation and surveillance for the time being and institute salvage therapy upon relapse.  2. Generalized anasarca: This have resolved which indicates normalization of her protein studies.  3. Antiemetics: Continue Compazine as needed. No further need for antiemetics at this time.  4. VZV prophylaxis: Continue acyclovir for the time being. Plan to discontinue 3-6 months after completion of Velcade.  5. Nutrition: Nutrition status has improved at this time. He continues to gain weight.  6. Cardiac complications of amyloid: She is following up with cardiology at [facility] regarding this  issue. Her cardiac status appeared stable euvolemic state.  7. Follow-up: Will be in 6 weeks to repeat protein studies.  [facility], MD  2/23/201712:46 PM

## 2015-08-17 NOTE — Telephone Encounter (Signed)
per pof to sch pt appt-gave pt copy of avs °

## 2015-08-17 NOTE — Telephone Encounter (Signed)
per of to sch pt appt-gave pt copy of avs °

## 2015-09-21 ENCOUNTER — Other Ambulatory Visit (HOSPITAL_BASED_OUTPATIENT_CLINIC_OR_DEPARTMENT_OTHER): Payer: 59

## 2015-09-21 DIAGNOSIS — E8581 Light chain (AL) amyloidosis: Secondary | ICD-10-CM

## 2015-09-21 DIAGNOSIS — E858 Other amyloidosis: Secondary | ICD-10-CM | POA: Diagnosis not present

## 2015-09-21 LAB — COMPREHENSIVE METABOLIC PANEL
ALBUMIN: 1.7 g/dL — AB (ref 3.5–5.0)
ALK PHOS: 75 U/L (ref 40–150)
ALT: 23 U/L (ref 0–55)
ANION GAP: 5 meq/L (ref 3–11)
AST: 30 U/L (ref 5–34)
BUN: 13.2 mg/dL (ref 7.0–26.0)
CALCIUM: 8.8 mg/dL (ref 8.4–10.4)
CO2: 30 mEq/L — ABNORMAL HIGH (ref 22–29)
Chloride: 108 mEq/L (ref 98–109)
Creatinine: 0.7 mg/dL (ref 0.6–1.1)
Glucose: 90 mg/dl (ref 70–140)
POTASSIUM: 4 meq/L (ref 3.5–5.1)
Sodium: 143 mEq/L (ref 136–145)
Total Bilirubin: <0.3 mg/dL (ref 0.20–1.20)
Total Protein: 4.8 g/dL — ABNORMAL LOW (ref 6.4–8.3)

## 2015-09-21 LAB — CBC WITH DIFFERENTIAL/PLATELET
BASO%: 0.6 % (ref 0.0–2.0)
BASOS ABS: 0 10*3/uL (ref 0.0–0.1)
EOS ABS: 0.2 10*3/uL (ref 0.0–0.5)
EOS%: 3.3 % (ref 0.0–7.0)
HEMATOCRIT: 40.3 % (ref 34.8–46.6)
HEMOGLOBIN: 13.6 g/dL (ref 11.6–15.9)
LYMPH#: 1.2 10*3/uL (ref 0.9–3.3)
LYMPH%: 17.7 % (ref 14.0–49.7)
MCH: 28.5 pg (ref 25.1–34.0)
MCHC: 33.7 g/dL (ref 31.5–36.0)
MCV: 84.5 fL (ref 79.5–101.0)
MONO#: 0.5 10*3/uL (ref 0.1–0.9)
MONO%: 7.7 % (ref 0.0–14.0)
NEUT#: 4.6 10*3/uL (ref 1.5–6.5)
NEUT%: 70.7 % (ref 38.4–76.8)
PLATELETS: 198 10*3/uL (ref 145–400)
RBC: 4.77 10*6/uL (ref 3.70–5.45)
RDW: 15.8 % — AB (ref 11.2–14.5)
WBC: 6.5 10*3/uL (ref 3.9–10.3)

## 2015-09-22 LAB — KAPPA/LAMBDA LIGHT CHAINS
Ig Kappa Free Light Chain: 11.69 mg/L (ref 3.30–19.40)
Ig Lambda Free Light Chain: 6.93 mg/L (ref 5.71–26.30)
Kappa/Lambda FluidC Ratio: 1.69 — ABNORMAL HIGH (ref 0.26–1.65)

## 2015-09-27 ENCOUNTER — Ambulatory Visit (HOSPITAL_BASED_OUTPATIENT_CLINIC_OR_DEPARTMENT_OTHER): Payer: 59 | Admitting: Oncology

## 2015-09-27 ENCOUNTER — Telehealth: Payer: Self-pay | Admitting: Oncology

## 2015-09-27 VITALS — BP 110/62 | HR 82 | Temp 98.5°F | Resp 18 | Ht 61.0 in | Wt 116.6 lb

## 2015-09-27 DIAGNOSIS — E8581 Light chain (AL) amyloidosis: Secondary | ICD-10-CM

## 2015-09-27 DIAGNOSIS — E858 Other amyloidosis: Secondary | ICD-10-CM

## 2015-09-27 NOTE — Telephone Encounter (Signed)
per pof to sch pt appt-gave pt copy of avs °

## 2015-09-27 NOTE — Progress Notes (Signed)
Hematology and Oncology Follow Up Visit  Anna Calderon [id number] [birth year] 55 y.o. 09/27/2015 12:14 PM Pcp Not In SystemNo ref. 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 Her disease involed the bone marrow as well as the kidneys. She has potentially cardiac and neurological involvement.   Prior Therapy: She is status post kidney biopsy done on 08/10/2014 and showed positive staining for amyloidosis.  She is status post bone marrow biopsy on 09/20/2014 which showed 19% plasma cell infiltration suggesting plasma cell neoplasm.  Velcade, Cytoxan and dexamethasone as follows:  Velcade at 1.5 mg/m subcutaneously on a weekly basis. Cyclophosphamide 300 mg/m (total dose of close to 450 mg) on a weekly basis. Dexamethasone at 20 mg by mouth (reduced from 40 mg because of her fluid retention) on a weekly basis.   Therapy started on 11/24/2014 till 07/06/2015. Therapy discontinued given her complete response.  Current therapy: Observation and surveillance.   Interim History:   Anna Calderon presents today for a follow-up visit with her husband. Since her last visit, she reports doing very well. She is reporting improvement in her health nearly back to normal. She is no longer reporting edema and her legs or abdomen. She is no longer taking diuretics at this time.  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 It is not interfering with her function at this time. Her appetite is excellent and performs activities of daily living without any decline. She does not report any chest pain or dyspnea on exertion.  She does not report any headaches, blurry vision, syncope or seizures. She denied any chest pain or shortness of breath.  She did not report any easy bruisability or petechiae.  She does  not report any fevers, chills, sweats, weight loss or abdominal discomfort. Did not report any nausea, vomiting. She does not report any urgency or hesitancy. She did not report any hematuria. She did not report any skeletal pain, lymphadenopathy or easy bruisability. The remaining review of systems unremarkable  Medications: I have reviewed the patient's current medications.  Current Outpatient Prescriptions  Medication Sig Dispense Refill  . acyclovir (ZOVIRAX) 400 MG tablet Take 1 tablet (400 mg total) by mouth 2 (two) times daily. 60 tablet 1  . aspirin 325 MG tablet Take 325 mg by mouth once.    . Cholecalciferol (VITAMIN D3) 1000 UNITS CAPS Take 1 tablet by mouth daily.    . cyclophosphamide (CYTOXAN) 50 MG tablet Take 9 tablets weekly on an empty stomach on the day of your chemotherapy. 36 tablet 0  . dexamethasone (DECADRON) 4 MG tablet Take 5 tablets every week with chemotherapy. 60 tablet 3  . midodrine (PROAMATINE) 5 MG tablet TAKE 1 TABLET(5 MG) BY MOUTH TWICE DAILY 60 tablet 0  . prochlorperazine (COMPAZINE) 10 MG tablet Take 1 tablet (10 mg total) by mouth every 6 (six) hours as needed for nausea or vomiting. (Patient not taking: Reported on 09/27/2015) 30 tablet 0   No current facility-administered medications for this visit.     Allergies: No Known Allergies  Past Medical History, Surgical history, Social history, and Family History were reviewed and updated.   Physical Exam: Blood pressure 110/62, pulse 82, temperature 98.5 F (36.9 C), temperature source Oral, resp. rate 18, height _0  (1.549 m), weight 116 lb 9.6 oz (52.889 kg), SpO2 99 %.  ECOG: 1 General appearance: Well-appearing woman without distress. Head: Normocephalic, without obvious abnormality  No oral thrush noted. Neck: no adenopathy Lymph nodes: Cervical, supraclavicular, and axillary nodes normal. Heart:regular rate and rhythm, S1, S2 normal, no murmur, click, rub or gallop Lung:chest clear, no  wheezing, rales, normal symmetric air entry.  Abdomin: soft, non-tender, without masses or organomegaly.  No rebound or guarding. No evidence of ascites. EXT: No edema noted. Skin: No ecchymosis or petechiae.  Lab Results: Lab Results  Component Value Date   WBC 6.5 09/21/2015   HGB 13.6 09/21/2015   HCT 40.3 09/21/2015   MCV 84.5 09/21/2015   PLT 198 09/21/2015     Chemistry      Component Value Date/Time   NA 143 09/21/2015 1006   NA 140 03/13/2015 0531   K 4.0 09/21/2015 1006   K 3.5 03/13/2015 0531   CL 107 03/13/2015 0531   CO2 30* 09/21/2015 1006   CO2 29 03/13/2015 0531   BUN 13.2 09/21/2015 1006   BUN 18 03/13/2015 0531   CREATININE 0.7 09/21/2015 1006   CREATININE 0.68 03/13/2015 0531      Component Value Date/Time   CALCIUM 8.8 09/21/2015 1006   CALCIUM 7.8* 03/13/2015 0531   ALKPHOS 75 09/21/2015 1006   ALKPHOS 66 03/07/2015 2008   AST 30 09/21/2015 1006   AST 23 03/07/2015 2008   ALT 23 09/21/2015 1006   ALT 13* 03/07/2015 2008   BILITOT <0.30 09/21/2015 1006   BILITOT 0.8 03/07/2015 2008      Results for TISHA, CLINE (MRN [id number]) as of 09/27/2015 11:56  Ref. Range 04/20/2015 09:50 05/26/2015 10:30 06/29/2015 11:36  Kappa free light chain Latest Ref Range: 0.33-1.94 mg/dL 0.65 0.70 0.92  Lambda Free Lght Chn Latest Ref Range: 0.57-2.63 mg/dL 0.54 (L) 0.29 (L) 0.57  Kappa:Lambda Ratio Latest Ref Range: 0.26-1.65  1.20 2.41 (H) 1.61      Impression and Plan:   55 year old woman with the following issues:  1. AL amyloidosis: Presented with a plasma cell disorder manifesting itself with bone marrow involvement with 19% plasma cells that are lambda light chain specific. Her free lambda light chain in the serum are elevated at 29 with At the lambda ratio depleted is 0.05. Staging CT scan did not show any evidence of clear-cut other organ involvement. 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   She is status post  VCD therapy completed in January 2017 with a complete response.   Her protein studies from 09/21/2015 were reviewed today and discussed with the patient and her husband. His laboratory studies showed complete response at this time with normalization of her light chain studies. Her total protein is improving as well as her albumin is close to normal.  The plan is to continue with observation and surveillance and repeat protein studies in 3 months. Different salvage therapy will be used upon progression.  2. Generalized anasarca: This have resolved which indicates normalization of her protein studies. She is no longer taking diuretics.  3. VZV prophylaxis: Continue acyclovir for the time being. Plan to discontinue 3-6 months after completion of Velcade. This will be discontinued after July 2017.  4. Cardiac complications of amyloid: She is following up with cardiology at [facility] regarding this issue. No cardiac issues noted.  5. Follow-up: Will be in 12 weeks to repeat protein studies.  Zola Button, MD  4/5/201712:14 PM

## 2015-12-27 ENCOUNTER — Other Ambulatory Visit (HOSPITAL_BASED_OUTPATIENT_CLINIC_OR_DEPARTMENT_OTHER): Payer: 59

## 2015-12-27 DIAGNOSIS — E858 Other amyloidosis: Secondary | ICD-10-CM

## 2015-12-27 DIAGNOSIS — E8581 Light chain (AL) amyloidosis: Secondary | ICD-10-CM

## 2015-12-27 LAB — CBC WITH DIFFERENTIAL/PLATELET
BASO%: 0.2 % (ref 0.0–2.0)
Basophils Absolute: 0 10*3/uL (ref 0.0–0.1)
EOS ABS: 0.1 10*3/uL (ref 0.0–0.5)
EOS%: 2.6 % (ref 0.0–7.0)
HCT: 39.8 % (ref 34.8–46.6)
HEMOGLOBIN: 13.2 g/dL (ref 11.6–15.9)
LYMPH%: 28.4 % (ref 14.0–49.7)
MCH: 27.7 pg (ref 25.1–34.0)
MCHC: 33.2 g/dL (ref 31.5–36.0)
MCV: 83.4 fL (ref 79.5–101.0)
MONO#: 0.4 10*3/uL (ref 0.1–0.9)
MONO%: 8.7 % (ref 0.0–14.0)
NEUT%: 60.1 % (ref 38.4–76.8)
NEUTROS ABS: 2.8 10*3/uL (ref 1.5–6.5)
Platelets: 158 10*3/uL (ref 145–400)
RBC: 4.77 10*6/uL (ref 3.70–5.45)
RDW: 14.6 % — AB (ref 11.2–14.5)
WBC: 4.7 10*3/uL (ref 3.9–10.3)
lymph#: 1.3 10*3/uL (ref 0.9–3.3)

## 2015-12-27 LAB — COMPREHENSIVE METABOLIC PANEL
ALBUMIN: 1.9 g/dL — AB (ref 3.5–5.0)
ALT: 20 U/L (ref 0–55)
AST: 20 U/L (ref 5–34)
Alkaline Phosphatase: 69 U/L (ref 40–150)
Anion Gap: 6 mEq/L (ref 3–11)
BUN: 12.4 mg/dL (ref 7.0–26.0)
CO2: 26 meq/L (ref 22–29)
CREATININE: 0.7 mg/dL (ref 0.6–1.1)
Calcium: 8.4 mg/dL (ref 8.4–10.4)
Chloride: 109 mEq/L (ref 98–109)
GLUCOSE: 65 mg/dL — AB (ref 70–140)
Potassium: 4 mEq/L (ref 3.5–5.1)
SODIUM: 141 meq/L (ref 136–145)
TOTAL PROTEIN: 5 g/dL — AB (ref 6.4–8.3)

## 2015-12-28 LAB — KAPPA/LAMBDA LIGHT CHAINS
Ig Kappa Free Light Chain: 15.4 mg/L (ref 3.3–19.4)
Ig Lambda Free Light Chain: 11.2 mg/L (ref 5.7–26.3)
KAPPA/LAMBDA FLC RATIO: 1.38 (ref 0.26–1.65)

## 2016-01-01 LAB — MULTIPLE MYELOMA PANEL, SERUM
ALPHA2 GLOB SERPL ELPH-MCNC: 0.9 g/dL (ref 0.4–1.0)
Albumin SerPl Elph-Mcnc: 1.9 g/dL — ABNORMAL LOW (ref 2.9–4.4)
Albumin/Glob SerPl: 0.9 (ref 0.7–1.7)
Alpha 1: 0.2 g/dL (ref 0.0–0.4)
B-Globulin SerPl Elph-Mcnc: 0.8 g/dL (ref 0.7–1.3)
Gamma Glob SerPl Elph-Mcnc: 0.4 g/dL (ref 0.4–1.8)
Globulin, Total: 2.2 g/dL (ref 2.2–3.9)
IGG (IMMUNOGLOBIN G), SERUM: 492 mg/dL — AB (ref 700–1600)
IGM (IMMUNOGLOBIN M), SRM: 50 mg/dL (ref 26–217)
IgA, Qn, Serum: 52 mg/dL — ABNORMAL LOW (ref 87–352)
TOTAL PROTEIN: 4.1 g/dL — AB (ref 6.0–8.5)

## 2016-01-01 LAB — UPEP/UIFE/LIGHT CHAINS/TP, 24-HR UR
% BETA, Urine: 10.8 %
ALBUMIN, U: 73.4 %
ALPHA 1 URINE: 6.5 %
ALPHA-2-GLOBULIN, U: 4.6 %
FREE KAPPA LT CHAINS, UR: 62.4 mg/L — AB (ref 1.35–24.19)
FREE LAMBDA LT CHAINS, UR: 9.42 mg/L — AB (ref 0.24–6.66)
GAMMA GLOBULIN URINE: 4.6 %
KAPPA/LAMBDA RATIO, U: 6.62 (ref 2.04–10.37)
PROTEIN,TOTAL,URINE: 288.3 mg/dL

## 2016-01-03 ENCOUNTER — Telehealth: Payer: Self-pay | Admitting: Oncology

## 2016-01-03 ENCOUNTER — Ambulatory Visit (HOSPITAL_BASED_OUTPATIENT_CLINIC_OR_DEPARTMENT_OTHER): Payer: 59 | Admitting: Oncology

## 2016-01-03 VITALS — BP 116/73 | HR 76 | Temp 98.1°F | Resp 18 | Ht 61.0 in | Wt 119.3 lb

## 2016-01-03 DIAGNOSIS — E858 Other amyloidosis: Secondary | ICD-10-CM

## 2016-01-03 DIAGNOSIS — E8581 Light chain (AL) amyloidosis: Secondary | ICD-10-CM

## 2016-01-03 NOTE — Progress Notes (Signed)
Hematology and Oncology Follow Up Visit  Anna Calderon [id number] 1960-12-09 55 y.o. 01/03/2016 9:26 AM Pcp Not In SystemNo ref. 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 Her disease involed the bone marrow as well as the kidneys. She has potentially cardiac and neurological involvement.   Prior Therapy: She is status post kidney biopsy done on 08/10/2014 and showed positive staining for amyloidosis.   She is status post bone marrow biopsy on 09/20/2014 which showed 19% plasma cell infiltration suggesting plasma cell neoplasm.  Velcade, Cytoxan and dexamethasone as follows:  Velcade at 1.5 mg/m subcutaneously on a weekly basis. Cyclophosphamide 300 mg/m (total dose of close to 450 mg) on a weekly basis. Dexamethasone at 20 mg by mouth (reduced from 40 mg because of her fluid retention) on a weekly basis.   Therapy started on 11/24/2014 till 07/06/2015. Therapy discontinued given her complete response.  Current therapy: Observation and surveillance.   Interim History:   Anna Calderon presents today for a follow-up visit with her husband. Since her last visit, she reports no changes in her health. She denies any worsening lower extremity edema or early satiety. She denied fluid retention or nausea. She is reporting improvement in her health nearly back to normal.  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 It is not interfering with her function at this time. She reports no chest pain or difficulty breathing. And able to resume all activities of daily living.  She does not report any headaches, blurry vision, syncope or seizures. She denied any chest pain or shortness of breath.  She did not report any easy bruisability or petechiae.  She does not report any fevers, chills,  sweats, weight loss or abdominal discomfort. Did not report any nausea, vomiting. She does not report any urgency or hesitancy. She did not report any hematuria. She did not report any skeletal pain, lymphadenopathy or easy bruisability. The remaining review of systems unremarkable  Medications: I have reviewed the patient's current medications.  Current Outpatient Prescriptions  Medication Sig Dispense Refill  . aspirin 325 MG tablet Take 325 mg by mouth once. Reported on 01/03/2016    . Cholecalciferol (VITAMIN D3) 1000 UNITS CAPS Take 1 tablet by mouth daily. Reported on 01/03/2016    . cyclophosphamide (CYTOXAN) 50 MG tablet Take 9 tablets weekly on an empty stomach on the day of your chemotherapy. (Patient not taking: Reported on 01/03/2016) 36 tablet 0  . prochlorperazine (COMPAZINE) 10 MG tablet Take 1 tablet (10 mg total) by mouth every 6 (six) hours as needed for nausea or vomiting. (Patient not taking: Reported on 09/27/2015) 30 tablet 0   No current facility-administered medications for this visit.     Allergies: No Known Allergies  Past Medical History, Surgical history, Social history, and Family History were reviewed and updated.   Physical Exam: Blood pressure 116/73, pulse 76, temperature 98.1 F (36.7 C), temperature source Oral, resp. rate 18, height _0  (1.549 m), weight 119 lb 4.8 oz (54.114 kg), SpO2 97 %.  ECOG: 1 General appearance: Alert, awake woman without distress. Head: Normocephalic, without obvious abnormality  No oral ulcers or lesions. Neck: no adenopathy Lymph nodes: Cervical, supraclavicular, and axillary nodes normal. Heart:regular rate and rhythm, S1, S2 normal, no murmur, click, rub or gallop Lung:chest clear, no wheezing, rales, normal symmetric  air entry.  Abdomin: soft, non-tender, without masses or organomegaly.  No shifting dullness or ascites. EXT: No edema noted. Skin: No ecchymosis or petechiae.  Lab Results: Lab Results  Component Value  Date   WBC 4.7 12/27/2015   HGB 13.2 12/27/2015   HCT 39.8 12/27/2015   MCV 83.4 12/27/2015   PLT 158 12/27/2015     Chemistry      Component Value Date/Time   NA 141 12/27/2015 0819   NA 140 03/13/2015 0531   K 4.0 12/27/2015 0819   K 3.5 03/13/2015 0531   CL 107 03/13/2015 0531   CO2 26 12/27/2015 0819   CO2 29 03/13/2015 0531   BUN 12.4 12/27/2015 0819   BUN 18 03/13/2015 0531   CREATININE 0.7 12/27/2015 0819   CREATININE 0.68 03/13/2015 0531      Component Value Date/Time   CALCIUM 8.4 12/27/2015 0819   CALCIUM 7.8* 03/13/2015 0531   ALKPHOS 69 12/27/2015 0819   ALKPHOS 66 03/07/2015 2008   AST 20 12/27/2015 0819   AST 23 03/07/2015 2008   ALT 20 12/27/2015 0819   ALT 13* 03/07/2015 2008   BILITOT <0.30 12/27/2015 0819   BILITOT 0.8 03/07/2015 2008       Results for Anna, Calderon (MRN [id number]) as of 01/03/2016 07:38  Ref. Range 12/27/2015 08:19  Ig Kappa Free Light Chain Latest Ref Range: 3.3-19.4 mg/L 15.4  Ig Lambda Free Light Chain Latest Ref Range: 5.7-26.3 mg/L 11.2  Kappa/Lambda FluidC Ratio Latest Ref Range: 0.26-1.65  1.38   Results for Anna, Calderon (MRN [id number]) as of 01/03/2016 07:38  Ref. Range 12/27/2015 08:26  ALBUMIN, U Latest Units: % 73.4  ALPHA 1 URINE Latest Units: % 6.5  % Beta Latest Units: % 10.8  GAMMA GLOBULIN URINE Latest Units: % 4.6  Free Kappa Lt Chains,Ur Latest Ref Range: 1.35-24.19 mg/L 62.40 (H)  Free Lambda Lt Chains,Ur Latest Ref Range: 0.24-6.66 mg/L 9.42 (H)  Kappa/Lambda Ratio,U Latest Ref Range: 2.04-10.37  6.62  Immunofixation Result, Urine Unknown Comment  ALPHA-2-GLOBULIN, U Latest Units: % 4.6  M-SPIKE, % Latest Ref Range: Not Observed % Not Observed     Impression and Plan:   54 year old woman with the following issues:  1. AL amyloidosis: Presented with a plasma cell disorder manifesting itself with bone marrow involvement with 19% plasma cells that are lambda light chain specific. Her free lambda light  chain in the serum are elevated at 29 with At the lambda ratio depleted is 0.05. Staging CT scan did not show any evidence of clear-cut other organ involvement. 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   She is status post VCD therapy completed in January 2017 with a complete response.   Her protein studies from 12/27/2015 were reviewed today and discussed with the patient and her husband. His laboratory studies showed complete response at this time with normalization of her light chain studies. Her total protein and albumin continues to improve approaching normal range.  The plan is to continue with observation and surveillance and repeat protein studies in 3 months. I see no need to use any salvage therapy for the time being. Stem cell transplant will be deferred given her excellent response to chemotherapy alone.  2. Generalized anasarca: This have resolved which indicates normalization of her protein studies. She is no longer taking diuretics.  3. VZV prophylaxis: Continue acyclovir for the time being. Plan to discontinue at this point as she is over  6 months out from [facility] therapy.  4. Cardiac complications of amyloid: She is following up with cardiology at [facility] regarding this issue. No cardiac issues noted.  5. Follow-up: Will be in 12 weeks to repeat protein studies.  [facility], MD  7/12/20179:26 AM

## 2016-01-03 NOTE — Telephone Encounter (Signed)
GAVE PATIENT AVS REPORT AND APPOINTMENTS FOR October  °

## 2016-04-04 ENCOUNTER — Other Ambulatory Visit (HOSPITAL_BASED_OUTPATIENT_CLINIC_OR_DEPARTMENT_OTHER): Payer: 59

## 2016-04-04 DIAGNOSIS — E8581 Light chain (AL) amyloidosis: Secondary | ICD-10-CM | POA: Diagnosis not present

## 2016-04-04 LAB — CBC WITH DIFFERENTIAL/PLATELET
BASO%: 0.2 % (ref 0.0–2.0)
BASOS ABS: 0 10*3/uL (ref 0.0–0.1)
EOS%: 2.7 % (ref 0.0–7.0)
Eosinophils Absolute: 0.2 10*3/uL (ref 0.0–0.5)
HEMATOCRIT: 39.5 % (ref 34.8–46.6)
HGB: 13.4 g/dL (ref 11.6–15.9)
LYMPH#: 1.5 10*3/uL (ref 0.9–3.3)
LYMPH%: 27.4 % (ref 14.0–49.7)
MCH: 28.2 pg (ref 25.1–34.0)
MCHC: 33.9 g/dL (ref 31.5–36.0)
MCV: 83.2 fL (ref 79.5–101.0)
MONO#: 0.4 10*3/uL (ref 0.1–0.9)
MONO%: 7.5 % (ref 0.0–14.0)
NEUT#: 3.4 10*3/uL (ref 1.5–6.5)
NEUT%: 62.2 % (ref 38.4–76.8)
PLATELETS: 151 10*3/uL (ref 145–400)
RBC: 4.75 10*6/uL (ref 3.70–5.45)
RDW: 14 % (ref 11.2–14.5)
WBC: 5.5 10*3/uL (ref 3.9–10.3)

## 2016-04-04 LAB — COMPREHENSIVE METABOLIC PANEL
ALT: 24 U/L (ref 0–55)
ANION GAP: 6 meq/L (ref 3–11)
AST: 25 U/L (ref 5–34)
Albumin: 2.3 g/dL — ABNORMAL LOW (ref 3.5–5.0)
Alkaline Phosphatase: 74 U/L (ref 40–150)
BUN: 13.8 mg/dL (ref 7.0–26.0)
CALCIUM: 8.7 mg/dL (ref 8.4–10.4)
CHLORIDE: 109 meq/L (ref 98–109)
CO2: 26 mEq/L (ref 22–29)
CREATININE: 0.7 mg/dL (ref 0.6–1.1)
EGFR: >90 mL/min/{1.73_m2} (ref >90)
Glucose: 93 mg/dl (ref 70–140)
POTASSIUM: 4.1 meq/L (ref 3.5–5.1)
Sodium: 142 mEq/L (ref 136–145)
Total Bilirubin: 0.35 mg/dL (ref 0.20–1.20)
Total Protein: 5.6 g/dL — ABNORMAL LOW (ref 6.4–8.3)

## 2016-04-05 LAB — KAPPA/LAMBDA LIGHT CHAINS
Ig Kappa Free Light Chain: 22.4 mg/L — ABNORMAL HIGH (ref 3.3–19.4)
Ig Lambda Free Light Chain: 12.6 mg/L (ref 5.7–26.3)
KAPPA/LAMBDA FLC RATIO: 1.78 — AB (ref 0.26–1.65)

## 2016-04-08 LAB — MULTIPLE MYELOMA PANEL, SERUM
ALBUMIN SERPL ELPH-MCNC: 2.6 g/dL — AB (ref 2.9–4.4)
ALBUMIN/GLOB SERPL: 1 (ref 0.7–1.7)
ALPHA 1: 0.2 g/dL (ref 0.0–0.4)
ALPHA2 GLOB SERPL ELPH-MCNC: 0.9 g/dL (ref 0.4–1.0)
B-Globulin SerPl Elph-Mcnc: 1 g/dL (ref 0.7–1.3)
GAMMA GLOB SERPL ELPH-MCNC: 0.6 g/dL (ref 0.4–1.8)
GLOBULIN, TOTAL: 2.7 g/dL (ref 2.2–3.9)
IGA/IMMUNOGLOBULIN A, SERUM: 62 mg/dL — AB (ref 87–352)
IGG (IMMUNOGLOBIN G), SERUM: 705 mg/dL (ref 700–1600)
IgM, Qn, Serum: 58 mg/dL (ref 26–217)
Total Protein: 5.3 g/dL — ABNORMAL LOW (ref 6.0–8.5)

## 2016-04-11 ENCOUNTER — Telehealth: Payer: Self-pay | Admitting: Oncology

## 2016-04-11 ENCOUNTER — Ambulatory Visit (HOSPITAL_BASED_OUTPATIENT_CLINIC_OR_DEPARTMENT_OTHER): Payer: 59 | Admitting: Oncology

## 2016-04-11 VITALS — BP 106/64 | HR 73 | Temp 98.0°F | Resp 18 | Ht 61.0 in | Wt 124.5 lb

## 2016-04-11 DIAGNOSIS — E8581 Light chain (AL) amyloidosis: Secondary | ICD-10-CM

## 2016-04-11 NOTE — Telephone Encounter (Signed)
Gave patient avs report and appointments for April  °

## 2016-04-11 NOTE — Progress Notes (Signed)
Hematology and Oncology Follow Up Visit  Anna Calderon [id number] Apr 15, 1961 55 y.o. 04/11/2016 9:31 AM Pcp Not In SystemNo ref. 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 Her disease involed the bone marrow as well as the kidneys. She has potentially cardiac and neurological involvement.   Prior Therapy: She is status post kidney biopsy done on 08/10/2014 and showed positive staining for amyloidosis.   She is status post bone marrow biopsy on 09/20/2014 which showed 19% plasma cell infiltration suggesting plasma cell neoplasm.  Velcade, Cytoxan and dexamethasone as follows:  Velcade at 1.5 mg/m subcutaneously on a weekly basis. Cyclophosphamide 300 mg/m (total dose of close to 450 mg) on a weekly basis. Dexamethasone at 20 mg by mouth (reduced from 40 mg because of her fluid retention) on a weekly basis.   Therapy started on 11/24/2014 till 07/06/2015. Therapy discontinued given her complete response.  Current therapy: Observation and surveillance.   Interim History:   Mrs. Wan presents today for a follow-up visit with her husband. Since her last visit, she continues to show recovery from her illness. Her lower extremity edema have resolved completely. Her generalized anasarca has also resolved. She reports no chest pain or difficulty breathing. She is able to resume all activities of daily living. She denied any dysphagia and exertion or difficulty with urination.  She does not report any headaches, blurry vision, syncope or seizures. She denied any chest pain or shortness of breath.  She did not report any easy bruisability or petechiae.  She does not report any fevers, chills, sweats, weight loss or abdominal discomfort. Did not report any nausea, vomiting. She does not report any urgency or hesitancy. She did not report any hematuria. She did not  report any skeletal pain, lymphadenopathy or easy bruisability. The remaining review of systems unremarkable  Medications: I have reviewed the patient's current medications.  Current Outpatient Prescriptions  Medication Sig Dispense Refill  . aspirin 325 MG tablet Take 325 mg by mouth once. Reported on 01/03/2016    . Cholecalciferol (VITAMIN D3) 1000 UNITS CAPS Take 1 tablet by mouth daily. Reported on 01/03/2016    . cyclophosphamide (CYTOXAN) 50 MG tablet Take 9 tablets weekly on an empty stomach on the day of your chemotherapy. (Patient not taking: Reported on 01/03/2016) 36 tablet 0  . prochlorperazine (COMPAZINE) 10 MG tablet Take 1 tablet (10 mg total) by mouth every 6 (six) hours as needed for nausea or vomiting. (Patient not taking: Reported on 09/27/2015) 30 tablet 0   No current facility-administered medications for this visit.      Allergies: No Known Allergies  Past Medical History, Surgical history, Social history, and Family History were reviewed and updated.   Physical Exam: Blood pressure 106/64, pulse 73, temperature 98 F (36.7 C), temperature source Oral, resp. rate 18, height '5\' 1"'$  (1.549 m), weight 124 lb 8 oz (56.5 kg), SpO2 98 %.  ECOG: 1 General appearance: Well-appearing woman without distress. Head: Normocephalic, without obvious abnormality  No oral thrush noted. Neck: no adenopathy Lymph nodes: Cervical, supraclavicular, and axillary nodes normal. Heart:regular rate and rhythm, S1, S2 normal, no murmur, click, rub or gallop Lung:chest clear, no wheezing, rales, normal symmetric air entry.  Abdomin: soft, non-tender, without masses or organomegaly.  No rebound or guarding. EXT: No edema noted. Skin: No ecchymosis or petechiae.  Lab Results: Lab Results  Component Value Date  WBC 5.5 04/04/2016   HGB 13.4 04/04/2016   HCT 39.5 04/04/2016   MCV 83.2 04/04/2016   PLT 151 04/04/2016     Chemistry      Component Value Date/Time   NA 142 04/04/2016 1135    K 4.1 04/04/2016 1135   CL 107 03/13/2015 0531   CO2 26 04/04/2016 1135   BUN 13.8 04/04/2016 1135   CREATININE 0.7 04/04/2016 1135      Component Value Date/Time   CALCIUM 8.7 04/04/2016 1135   ALKPHOS 74 04/04/2016 1135   AST 25 04/04/2016 1135   ALT 24 04/04/2016 1135   BILITOT 0.35 04/04/2016 1135     Results for ZAILYNN, BRANDEL (MRN [id number]) as of 04/11/2016 09:33  Ref. Range 12/27/2015 08:19 12/27/2015 08:26 04/04/2016 11:35  Ig Kappa Free Light Chain Latest Ref Range: 3.3 - 19.4 mg/L 15.4  22.4 (H)  Ig Lambda Free Light Chain Latest Ref Range: 5.7 - 26.3 mg/L 11.2  12.6  Kappa/Lambda FluidC Ratio Latest Ref Range: 0.26 - 1.65  1.38  1.78 (H)    Impression and Plan:   55 year old woman with the following issues:  1. AL amyloidosis: Presented with a plasma cell disorder manifesting itself with bone marrow involvement with 19% plasma cells that are lambda light chain specific. Her free lambda light chain in the serum are elevated at 29 with At the lambda ratio depleted is 0.05. Staging CT scan did not show any evidence of clear-cut other organ involvement. 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   She is status post VCD therapy completed in January 2017 with a complete response.   Her protein studies from  03/25/2016 were reviewed today and discussed with the patient and her husband. She does have mild increase in her IgG kappa as well as the ratio. This has been fluctuating for the last few months without any evidence of recurrent disease. Her serum total protein is improving as well as her albumin.  The plan is to continue with observation and surveillance and repeat protein studies in 6 months. I see no need to use any salvage therapy for the time being.   2. Generalized anasarca: This have resolved which indicates normalization of her protein studies. She is no longer taking diuretics.  3. VZV prophylaxis: This has been discontinued  after 6 months of therapy.  4. Cardiac complications of amyloid: She is following up with cardiology at [facility] regarding this issue. No cardiac issues noted.  5. Follow-up: Will be in 6 months.Zola Button, MD  10/19/20179:31 AM

## 2016-04-17 LAB — UPEP/UIFE/LIGHT CHAINS/TP, 24-HR UR
% BETA, URINE: 8.6 %
ALBUMIN, U: 79.4 %
ALPHA 1 URINE: 4.8 %
ALPHA-2-GLOBULIN, U: 2.4 %
Free Kappa Lt Chains,Ur: 47 mg/L — ABNORMAL HIGH (ref 1.35–24.19)
Free Lambda Lt Chains,Ur: 17.8 mg/L — ABNORMAL HIGH (ref 0.24–6.66)
GAMMA GLOBULIN URINE: 4.7 %
Kappa/Lambda Ratio,U: 2.64 (ref 2.04–10.37)
PDF: 0
PROTEIN UR: 173.3 mg/dL
Prot,24hr calculated: 4939 mg/24 hr — ABNORMAL HIGH (ref 30–150)

## 2016-05-17 IMAGING — US US BIOPSY
1 series · 6 of 6 positions shown · non-contrast
Comparison: None.

INDICATION: Chronic renal insufficiency. Bilateral lower extremity edema. Please
perform ultrasound-guided random renal biopsy for tissue diagnostic
purposes.

EXAM:
ULTRASOUND GUIDED RENAL BIOPSY

[Series 1: us biopsy · 0.18mm/px · 6 of 6 slices shown]
[im 1/6]
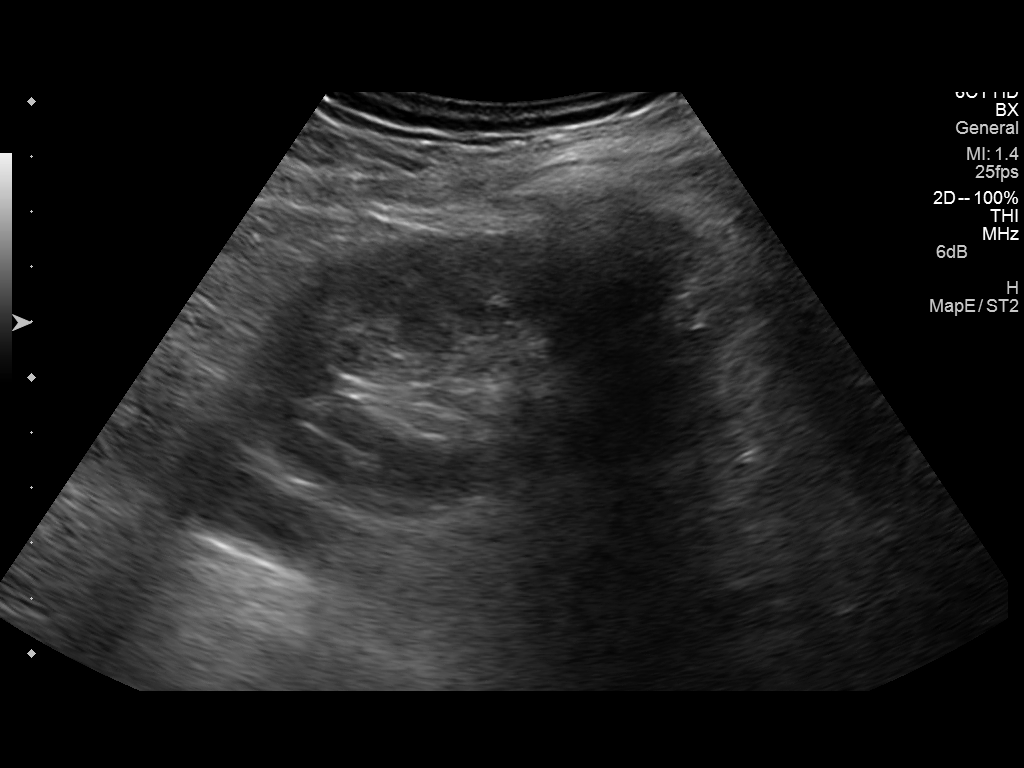
[im 2/6]
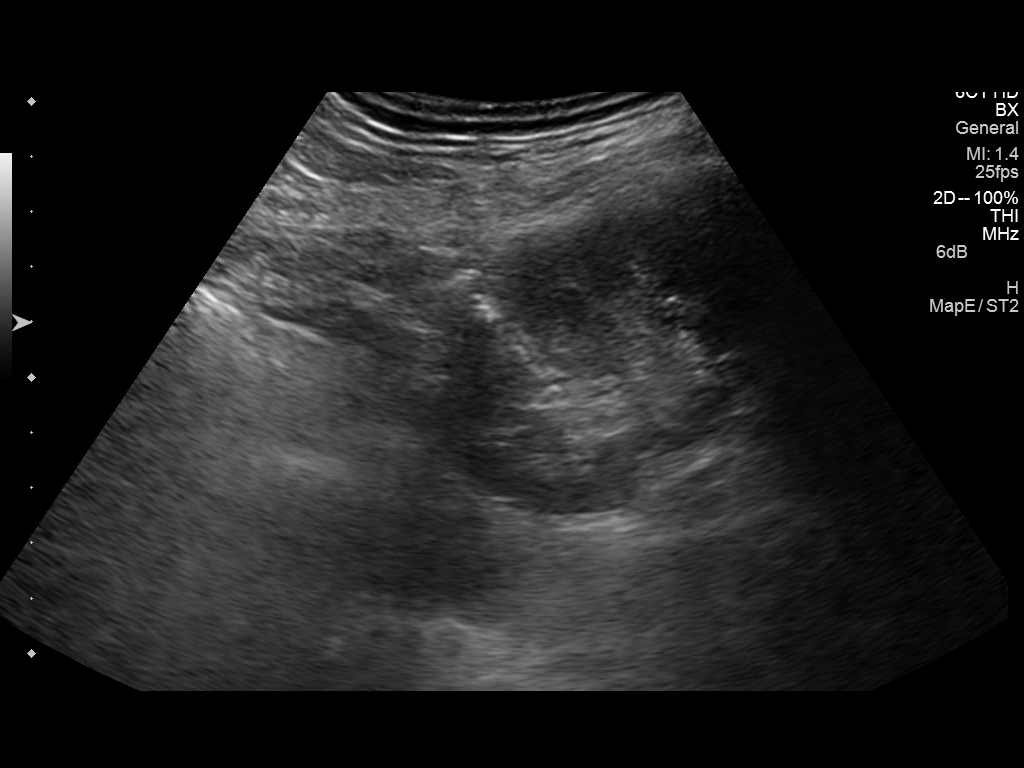
[im 3/6]
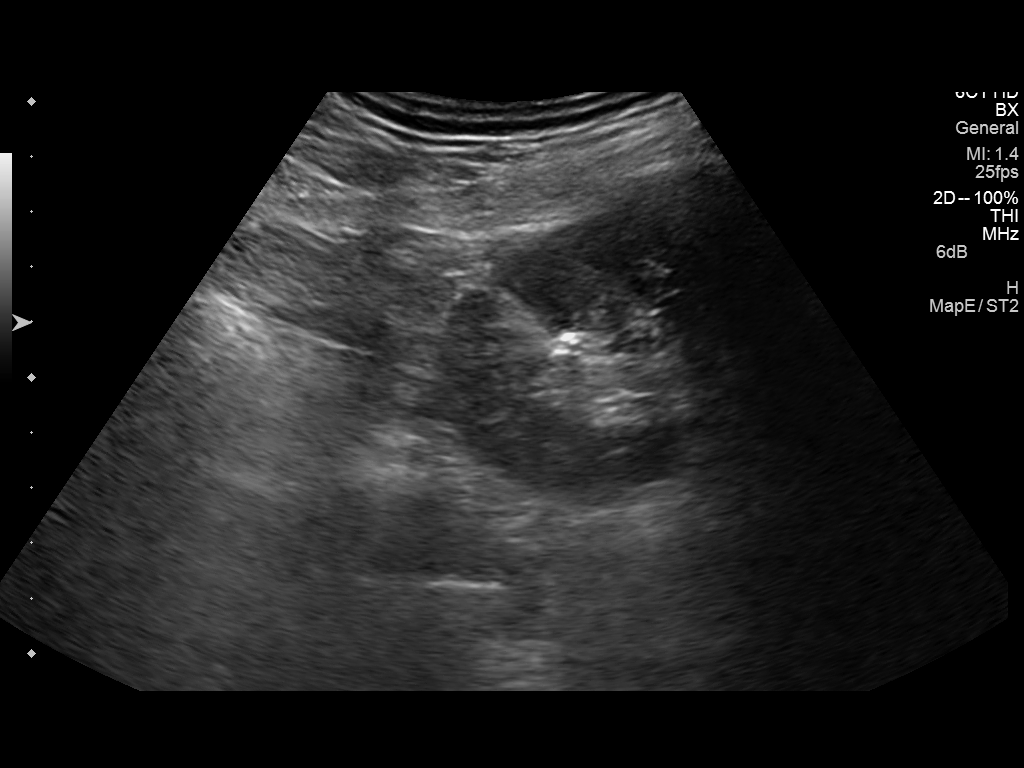
[im 4/6]
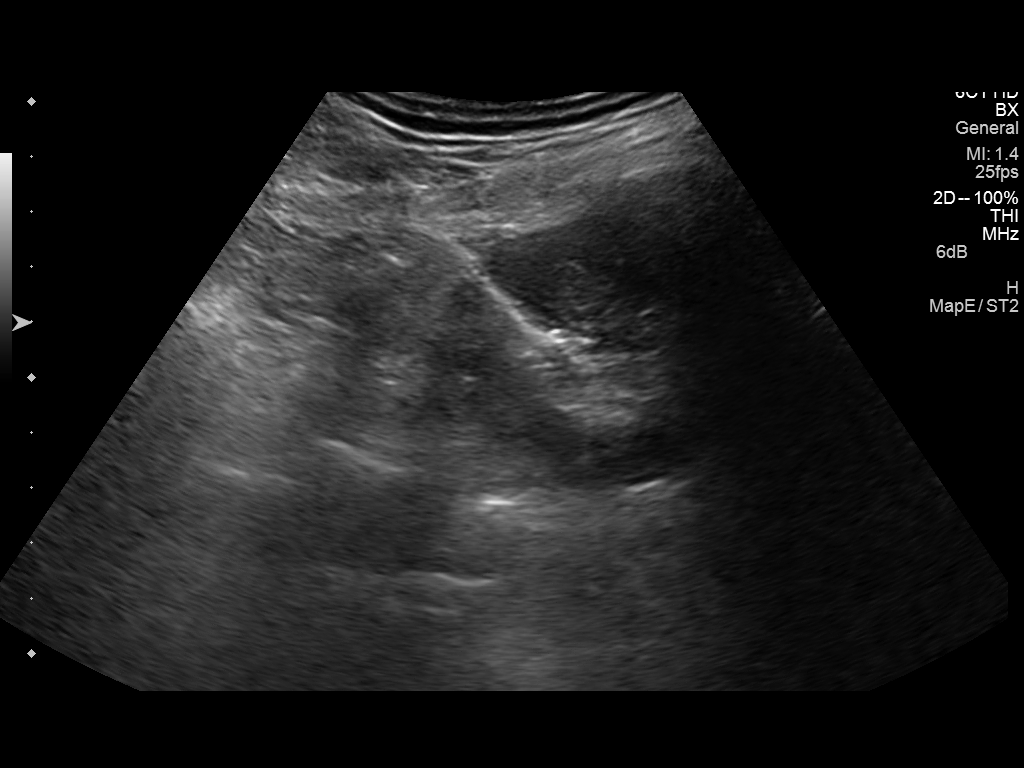
[im 5/6]
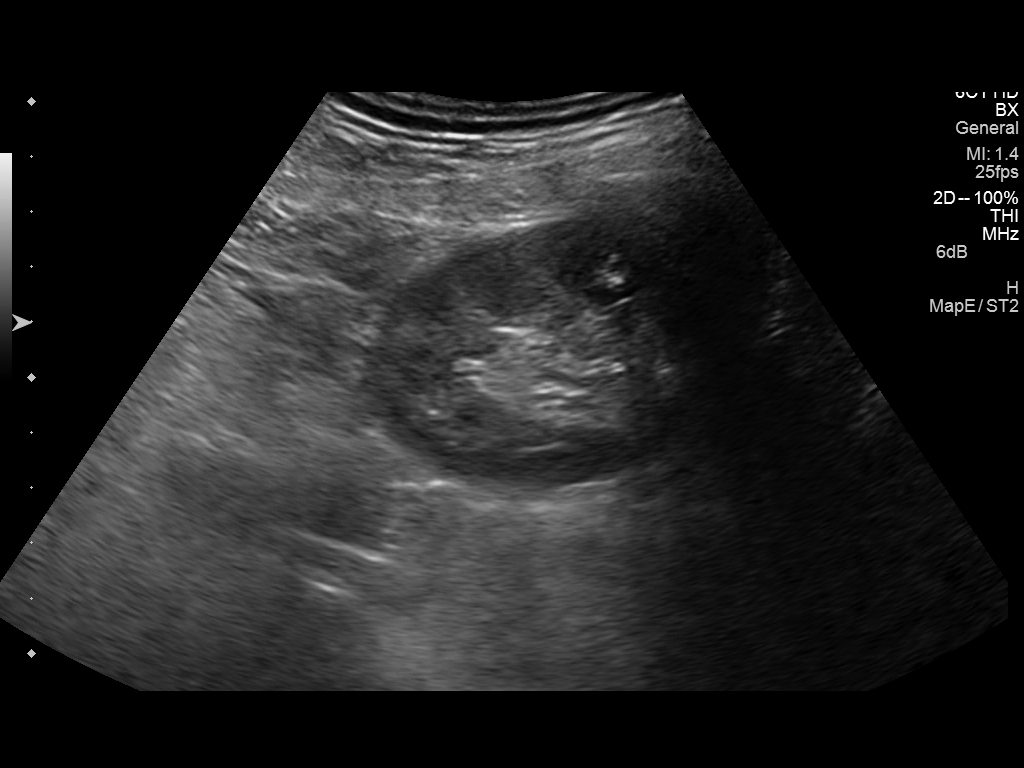
[im 6/6]
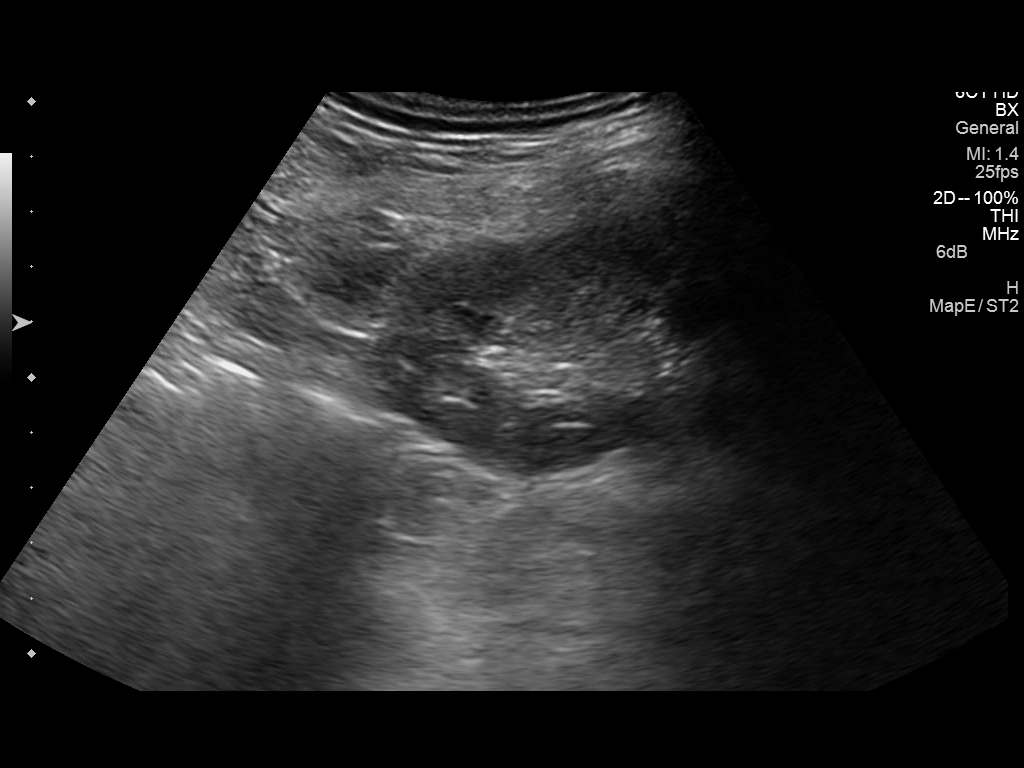

[6 of 6 positions shown; findings below may reference images not displayed]

MEDICATIONS:
Fentanyl 50 mcg IV; Versed 1 mg IV

ANESTHESIA/SEDATION:
Total Moderate Sedation time

15 minutes

COMPLICATIONS:
None immediate

PROCEDURE:
Informed written consent was obtained from the patient after a
discussion of the risks, benefits and alternatives to treatment. The
patient understands and consents the procedure. A timeout was
performed prior to the initiation of the procedure.

Ultrasound scanning was performed of the bilateral flanks. The
inferior pole of the right kidney was selected for biopsy due to
location and sonographic window. The procedure was planned. The
operative site was prepped and draped in the usual sterile fashion.
The overlying soft tissues were anesthetized with 1% lidocaine with
epinephrine. A 17 gauge core needle biopsy device was advanced into
the inferior cortex of the right kidney and 2 core biopsies were
obtained under direct ultrasound guidance. Real time pathologic
review confirmed adequate tissue acquisition. Images were saved for
documentation purposes. The biopsy device was removed and hemostasis
was obtained with manual compression. Post procedural scanning was
negative for significant post procedural hemorrhage or additional
complication. A dressing was placed. The patient tolerated the
procedure well without immediate post procedural complication.
IMPRESSION: Technically successful ultrasound guided right renal biopsy.

## 2016-06-11 IMAGING — CR DG CHEST 2V
2 series · 2 of 2 positions shown · non-contrast
Comparison: Chest CT 09/13/2014

CLINICAL DATA: Fever since 6000 hour today, 8 hours prior.

EXAM:
CHEST  2 VIEW

[w chest pa]
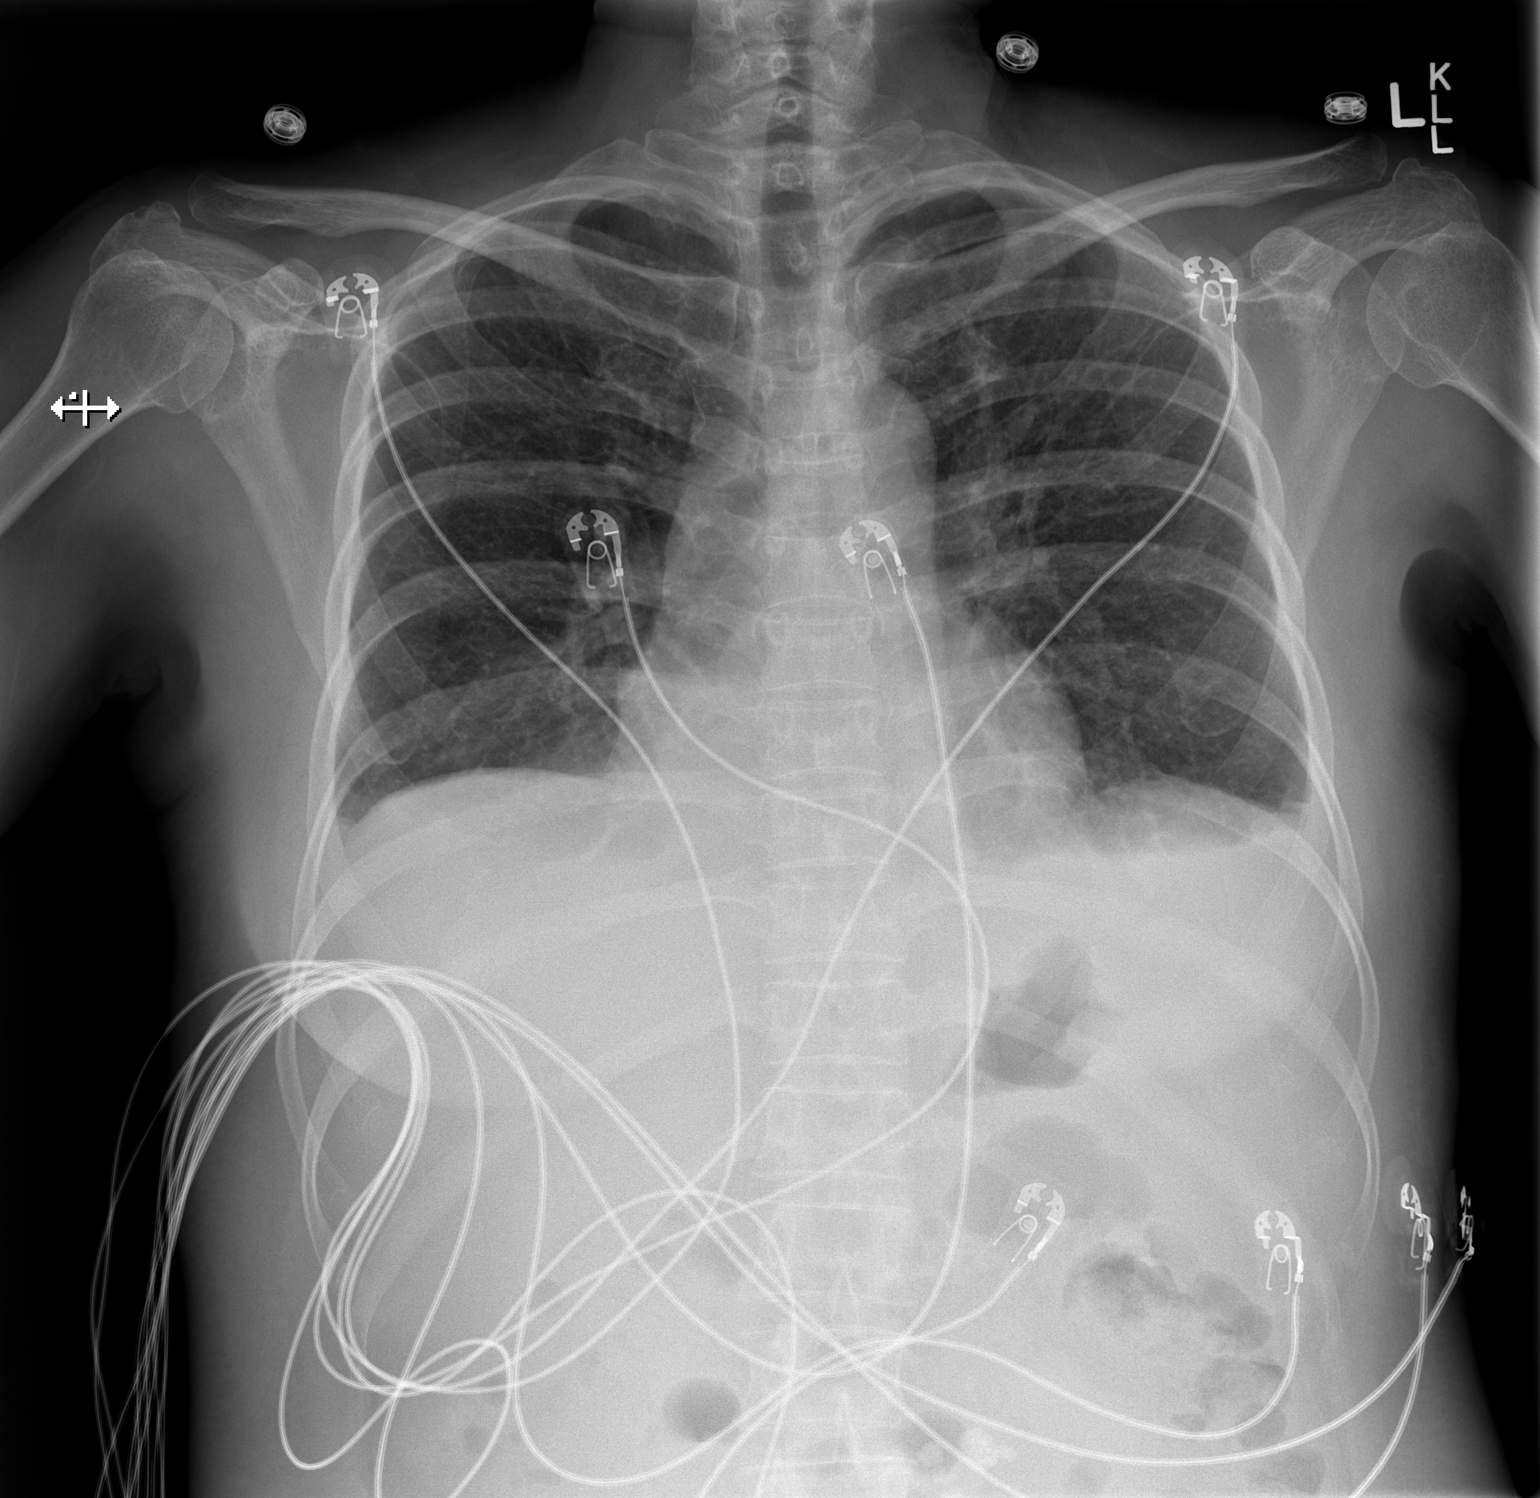

[w chest lat]
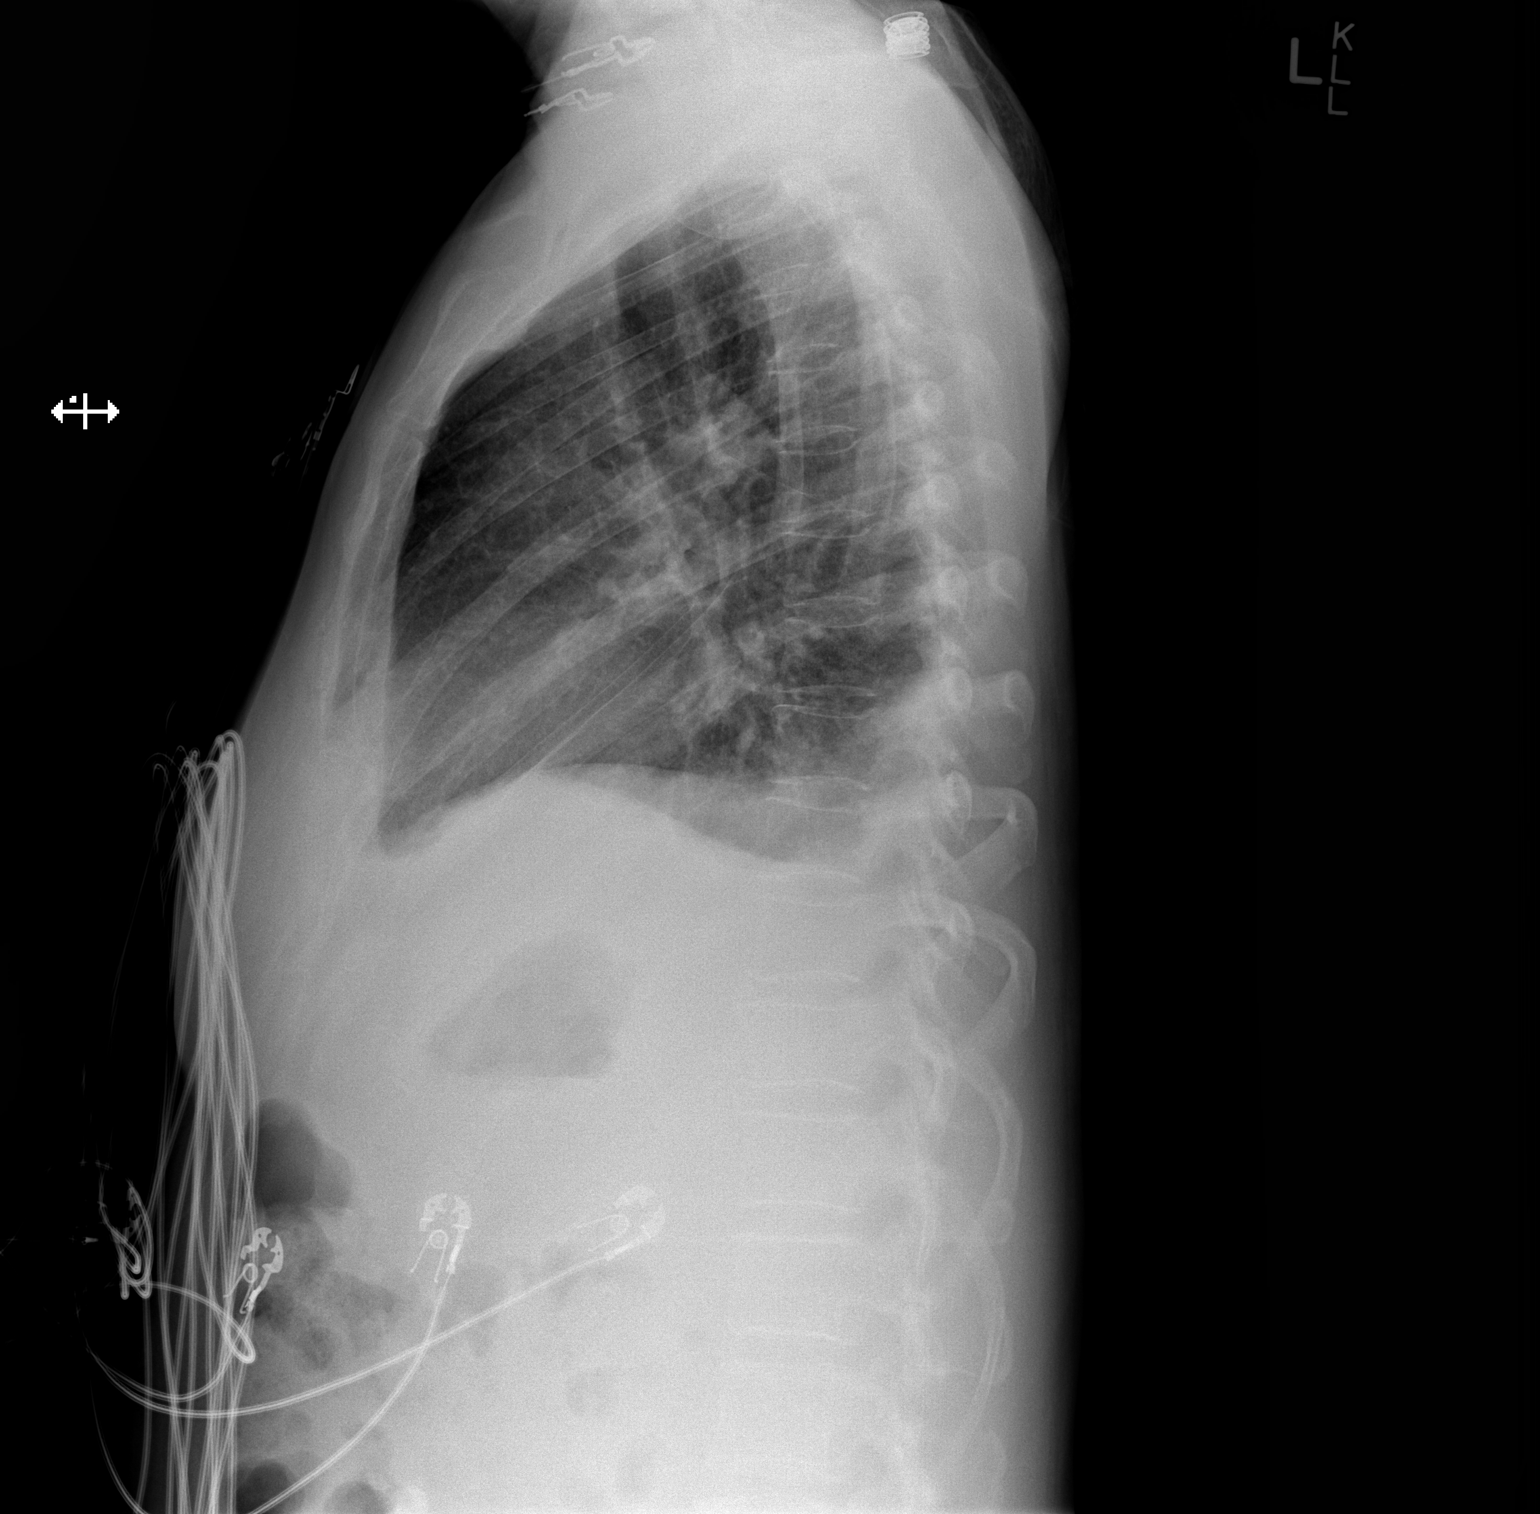

[2 of 2 positions shown; findings below may reference images not displayed]

FINDINGS: Small bilateral pleural effusions with adjacent atelectasis. No
confluent airspace disease to suggest pneumonia. The heart size and
mediastinal contours are normal. No pulmonary edema or pneumothorax.
No acute osseous abnormalities are seen.
IMPRESSION: Small bilateral pleural effusions with adjacent atelectasis at the
lung bases. No consolidation to suggest pneumonia.

## 2016-06-12 IMAGING — DX DG CHEST 1V PORT
1 series · 1 of 1 positions shown · non-contrast
Comparison: Chest x-ray dated 03/07/2015 and chest CT dated
09/13/2014

CLINICAL DATA: Pulmonary edema.

EXAM:
PORTABLE CHEST - 1 VIEW

[chest ap]
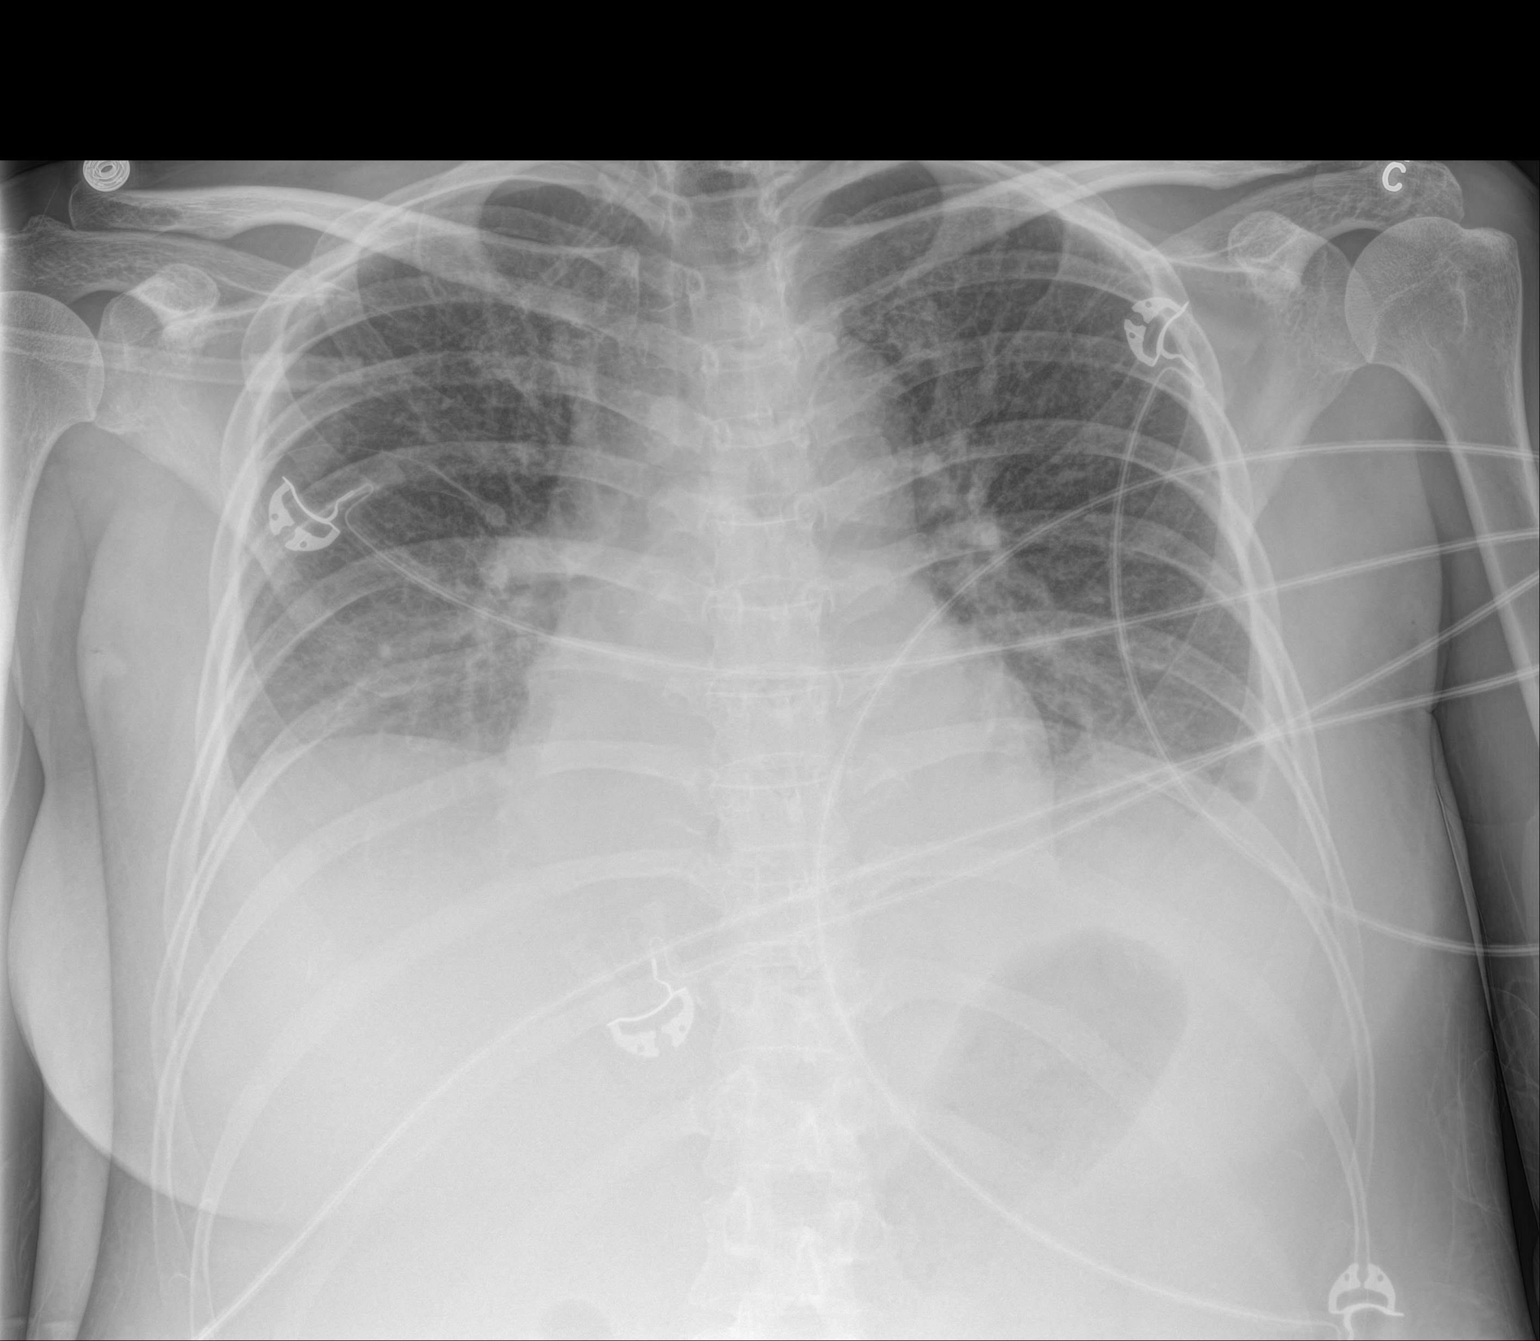

[1 of 1 positions shown; findings below may reference images not displayed]

FINDINGS: Pulmonary vascularity is slightly more prominent than on the prior
study. Heart size remains normal. Bilateral pleural effusions
processed, slightly more prominent. No discrete consolidated
infiltrates.
IMPRESSION: Slightly increased bilateral pleural effusions with new pulmonary
vascular congestion.

## 2016-06-24 DIAGNOSIS — C801 Malignant (primary) neoplasm, unspecified: Secondary | ICD-10-CM

## 2016-06-24 HISTORY — DX: Malignant (primary) neoplasm, unspecified: C80.1

## 2016-07-05 ENCOUNTER — Other Ambulatory Visit: Payer: Self-pay | Admitting: Nurse Practitioner

## 2016-10-03 ENCOUNTER — Other Ambulatory Visit (HOSPITAL_BASED_OUTPATIENT_CLINIC_OR_DEPARTMENT_OTHER): Payer: 59

## 2016-10-03 DIAGNOSIS — E8581 Light chain (AL) amyloidosis: Secondary | ICD-10-CM | POA: Diagnosis not present

## 2016-10-03 LAB — CBC WITH DIFFERENTIAL/PLATELET
BASO%: 0.5 % (ref 0.0–2.0)
Basophils Absolute: 0 10*3/uL (ref 0.0–0.1)
EOS ABS: 0.2 10*3/uL (ref 0.0–0.5)
EOS%: 3.5 % (ref 0.0–7.0)
HCT: 41.8 % (ref 34.8–46.6)
HGB: 14 g/dL (ref 11.6–15.9)
LYMPH%: 24.7 % (ref 14.0–49.7)
MCH: 28.8 pg (ref 25.1–34.0)
MCHC: 33.5 g/dL (ref 31.5–36.0)
MCV: 86.1 fL (ref 79.5–101.0)
MONO#: 0.4 10*3/uL (ref 0.1–0.9)
MONO%: 6.8 % (ref 0.0–14.0)
NEUT%: 64.5 % (ref 38.4–76.8)
NEUTROS ABS: 3.4 10*3/uL (ref 1.5–6.5)
Platelets: 205 10*3/uL (ref 145–400)
RBC: 4.85 10*6/uL (ref 3.70–5.45)
RDW: 14 % (ref 11.2–14.5)
WBC: 5.3 10*3/uL (ref 3.9–10.3)
lymph#: 1.3 10*3/uL (ref 0.9–3.3)

## 2016-10-03 LAB — COMPREHENSIVE METABOLIC PANEL
ALT: 15 U/L (ref 0–55)
AST: 19 U/L (ref 5–34)
Albumin: 3.1 g/dL — ABNORMAL LOW (ref 3.5–5.0)
Alkaline Phosphatase: 69 U/L (ref 40–150)
Anion Gap: 8 mEq/L (ref 3–11)
BILIRUBIN TOTAL: 0.36 mg/dL (ref 0.20–1.20)
BUN: 11.1 mg/dL (ref 7.0–26.0)
CHLORIDE: 109 meq/L (ref 98–109)
CO2: 26 meq/L (ref 22–29)
Calcium: 9.2 mg/dL (ref 8.4–10.4)
Creatinine: 0.7 mg/dL (ref 0.6–1.1)
Glucose: 87 mg/dl (ref 70–140)
Potassium: 4.4 mEq/L (ref 3.5–5.1)
Sodium: 142 mEq/L (ref 136–145)
TOTAL PROTEIN: 6.2 g/dL — AB (ref 6.4–8.3)

## 2016-10-04 LAB — KAPPA/LAMBDA LIGHT CHAINS
Ig Kappa Free Light Chain: 16.4 mg/L (ref 3.3–19.4)
Ig Lambda Free Light Chain: 10.3 mg/L (ref 5.7–26.3)
KAPPA/LAMBDA FLC RATIO: 1.59 (ref 0.26–1.65)

## 2016-10-07 LAB — MULTIPLE MYELOMA PANEL, SERUM
ALBUMIN SERPL ELPH-MCNC: 3.1 g/dL (ref 2.9–4.4)
Albumin/Glob SerPl: 1.2 (ref 0.7–1.7)
Alpha 1: 0.2 g/dL (ref 0.0–0.4)
Alpha2 Glob SerPl Elph-Mcnc: 0.8 g/dL (ref 0.4–1.0)
B-Globulin SerPl Elph-Mcnc: 0.9 g/dL (ref 0.7–1.3)
GLOBULIN, TOTAL: 2.6 g/dL (ref 2.2–3.9)
Gamma Glob SerPl Elph-Mcnc: 0.7 g/dL (ref 0.4–1.8)
IGA/IMMUNOGLOBULIN A, SERUM: 55 mg/dL — AB (ref 87–352)
IGG (IMMUNOGLOBIN G), SERUM: 626 mg/dL — AB (ref 700–1600)
IGM (IMMUNOGLOBIN M), SRM: 63 mg/dL (ref 26–217)
Total Protein: 5.7 g/dL — ABNORMAL LOW (ref 6.0–8.5)

## 2016-10-10 ENCOUNTER — Ambulatory Visit (HOSPITAL_BASED_OUTPATIENT_CLINIC_OR_DEPARTMENT_OTHER): Payer: 59 | Admitting: Oncology

## 2016-10-10 ENCOUNTER — Telehealth: Payer: Self-pay | Admitting: Oncology

## 2016-10-10 VITALS — BP 123/67 | HR 74 | Temp 97.9°F | Ht 61.0 in | Wt 128.2 lb

## 2016-10-10 DIAGNOSIS — R601 Generalized edema: Secondary | ICD-10-CM

## 2016-10-10 DIAGNOSIS — E8581 Light chain (AL) amyloidosis: Secondary | ICD-10-CM | POA: Diagnosis not present

## 2016-10-10 NOTE — Telephone Encounter (Signed)
Gave patient AVS and calender per 4/19 los.  

## 2016-10-10 NOTE — Progress Notes (Signed)
Hematology and Oncology Follow Up Visit  Anna Calderon [id number] [birth year] 56 y.o. 10/10/2016 9:49 AM Pcp Not In SystemNo ref. 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 Her disease involed the bone marrow as well as the kidneys. She has potentially cardiac and neurological involvement.   Prior Therapy: She is status post kidney biopsy done on 08/10/2014 and showed positive staining for amyloidosis.   She is status post bone marrow biopsy on 09/20/2014 which showed 19% plasma cell infiltration suggesting plasma cell neoplasm.  Velcade, Cytoxan and dexamethasone as follows:  Velcade at 1.5 mg/m subcutaneously on a weekly basis. Cyclophosphamide 300 mg/m (total dose of close to 450 mg) on a weekly basis. Dexamethasone at 20 mg by mouth (reduced from 40 mg because of her fluid retention) on a weekly basis.   Therapy started on 11/24/2014 till 07/06/2015. Therapy discontinued given her complete response.  Current therapy: Observation and surveillance.   Interim History:   [name] presents today for a follow-up visit with her husband. Since her last visit, she reports no recent complaints. She has resumed all activities of daily living without any decline. She drove herself today which she has been doing without any issues. She reports no chest pain or difficulty breathing. She is able to resume all activities of daily living. She denied any dysphagia and exertion or difficulty with urination. She denied any lower extremity edema or early satiety.  She does not report any headaches, blurry vision, syncope or seizures. She denied any chest pain or shortness of breath.  She did not report any easy bruisability or petechiae.  She does not report any fevers, chills, sweats, weight loss or abdominal discomfort. Did not report any nausea, vomiting. She does not  report any urgency or hesitancy. She did not report any hematuria. She did not report any skeletal pain, lymphadenopathy or easy bruisability. The remaining review of systems unremarkable  Medications: I have reviewed the patient's current medications.  Current Outpatient Prescriptions  Medication Sig Dispense Refill  . levothyroxine (SYNTHROID, LEVOTHROID) 50 MCG tablet Take 1 tablet by mouth daily.     No current facility-administered medications for this visit.      Allergies: No Known Allergies  Past Medical History, Surgical history, Social history, and Family History were reviewed and updated.   Physical Exam: Blood pressure 123/67, pulse 74, temperature 97.9 F (36.6 C), temperature source Oral, height 5' 1" (1.549 m), weight 128 lb 3.2 oz (58.2 kg), SpO2 97 %.  ECOG: 1 General appearance: Alert, awake woman without distress. Head: Normocephalic, without obvious abnormality  No oral thrush noted. Neck: no adenopathy Lymph nodes: Cervical, supraclavicular, and axillary nodes normal. Heart:regular rate and rhythm, S1, S2 normal, no murmur, click, rub or gallop Lung:chest clear, no wheezing, rales, normal symmetric air entry.  Abdomin: soft, non-tender, without masses or organomegaly.  No shifting dullness or ascites. EXT: No edema noted. Skin: No ecchymosis or petechiae.  Lab Results: Lab Results  Component Value Date   WBC 5.3 10/03/2016   HGB 14.0 10/03/2016   HCT 41.8 10/03/2016   MCV 86.1 10/03/2016   PLT 205 10/03/2016     Chemistry      Component Value Date/Time   NA 142 10/03/2016 1052   K 4.4 10/03/2016 1052   CL 107 03/13/2015 0531   CO2 26 10/03/2016 1052   BUN 11.1 10/03/2016 1052  CREATININE 0.7 10/03/2016 1052      Component Value Date/Time   CALCIUM 9.2 10/03/2016 1052   ALKPHOS 69 10/03/2016 1052   AST 19 10/03/2016 1052   ALT 15 10/03/2016 1052   BILITOT 0.36 10/03/2016 1052     Results for Anna, Calderon (MRN [id number]) as of 10/10/2016  09:42  Ref. Range 10/03/2016 10:52  Ig Kappa Free Light Chain Latest Ref Range: 3.3 - 19.4 mg/L 16.4  Ig Lambda Free Light Chain Latest Ref Range: 5.7 - 26.3 mg/L 10.3  Kappa/Lambda FluidC Ratio Latest Ref Range: 0.26 - 1.65  1.59     Impression and Plan:   56 year old woman with the following issues:  1. AL amyloidosis: Presented with a plasma cell disorder manifesting itself with bone marrow involvement with 19% plasma cells that are lambda light chain specific. Her free lambda light chain in the serum are elevated at 29 with At the lambda ratio depleted is 0.05. Staging CT scan did not show any evidence of clear-cut other organ involvement. 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   She is status post VCD therapy completed in January 2017 with a complete response.   Her protein studies from 10/03/2016 were personally reviewed today in continued to show complete response.  The plan is to continue with observation and surveillance and repeat protein studies in 6 months.  2. Generalized anasarca: Soft component this time.  3. VZV prophylaxis: This has been discontinued after 6 months of therapy.  4. Cardiac complications of amyloid: She is following up with cardiology at [facility] regarding this issue.   5. Follow-up: Will be in 6 months.Zola Button, MD  4/19/20189:49 AM

## 2016-10-11 ENCOUNTER — Telehealth: Payer: Self-pay | Admitting: Oncology

## 2016-10-11 NOTE — Telephone Encounter (Signed)
Called patient and left message wit r/s appt time and date per Dr.Shadad due to availability - sent appt reminder letter in the mail.

## 2017-03-28 ENCOUNTER — Other Ambulatory Visit: Payer: Self-pay

## 2017-03-28 ENCOUNTER — Other Ambulatory Visit (HOSPITAL_BASED_OUTPATIENT_CLINIC_OR_DEPARTMENT_OTHER): Payer: 59

## 2017-03-28 DIAGNOSIS — E8581 Light chain (AL) amyloidosis: Secondary | ICD-10-CM

## 2017-03-28 LAB — CBC WITH DIFFERENTIAL/PLATELET
BASO%: 0.6 % (ref 0.0–2.0)
BASOS ABS: 0 10*3/uL (ref 0.0–0.1)
EOS%: 3 % (ref 0.0–7.0)
Eosinophils Absolute: 0.1 10*3/uL (ref 0.0–0.5)
HCT: 40.5 % (ref 34.8–46.6)
HGB: 13.5 g/dL (ref 11.6–15.9)
LYMPH%: 28.9 % (ref 14.0–49.7)
MCH: 29 pg (ref 25.1–34.0)
MCHC: 33.3 g/dL (ref 31.5–36.0)
MCV: 87.1 fL (ref 79.5–101.0)
MONO#: 0.4 10*3/uL (ref 0.1–0.9)
MONO%: 8.9 % (ref 0.0–14.0)
NEUT#: 2.4 10*3/uL (ref 1.5–6.5)
NEUT%: 58.6 % (ref 38.4–76.8)
Platelets: 159 10*3/uL (ref 145–400)
RBC: 4.65 10*6/uL (ref 3.70–5.45)
RDW: 13.9 % (ref 11.2–14.5)
WBC: 4.1 10*3/uL (ref 3.9–10.3)
lymph#: 1.2 10*3/uL (ref 0.9–3.3)

## 2017-03-28 LAB — COMPREHENSIVE METABOLIC PANEL
ALT: 8 U/L (ref 0–55)
AST: 15 U/L (ref 5–34)
Albumin: 3.6 g/dL (ref 3.5–5.0)
Alkaline Phosphatase: 51 U/L (ref 40–150)
Anion Gap: 6 mEq/L (ref 3–11)
BUN: 12.6 mg/dL (ref 7.0–26.0)
CHLORIDE: 108 meq/L (ref 98–109)
CO2: 27 meq/L (ref 22–29)
Calcium: 9.1 mg/dL (ref 8.4–10.4)
Creatinine: 0.7 mg/dL (ref 0.6–1.1)
EGFR: >90 mL/min/{1.73_m2} (ref >90)
GLUCOSE: 87 mg/dL (ref 70–140)
POTASSIUM: 3.8 meq/L (ref 3.5–5.1)
SODIUM: 141 meq/L (ref 136–145)
Total Bilirubin: 0.82 mg/dL (ref 0.20–1.20)
Total Protein: 6.4 g/dL (ref 6.4–8.3)

## 2017-03-31 LAB — KAPPA/LAMBDA LIGHT CHAINS
Ig Kappa Free Light Chain: 12.4 mg/L (ref 3.3–19.4)
Ig Lambda Free Light Chain: 9.1 mg/L (ref 5.7–26.3)
Kappa/Lambda FluidC Ratio: 1.36 (ref 0.26–1.65)

## 2017-04-02 LAB — MULTIPLE MYELOMA PANEL, SERUM
ALBUMIN SERPL ELPH-MCNC: 3.3 g/dL (ref 2.9–4.4)
Albumin/Glob SerPl: 1.4 (ref 0.7–1.7)
Alpha 1: 0.2 g/dL (ref 0.0–0.4)
Alpha2 Glob SerPl Elph-Mcnc: 0.7 g/dL (ref 0.4–1.0)
B-Globulin SerPl Elph-Mcnc: 0.8 g/dL (ref 0.7–1.3)
GAMMA GLOB SERPL ELPH-MCNC: 0.7 g/dL (ref 0.4–1.8)
Globulin, Total: 2.5 g/dL (ref 2.2–3.9)
IgA, Qn, Serum: 65 mg/dL — ABNORMAL LOW (ref 87–352)
IgG, Qn, Serum: 747 mg/dL (ref 700–1600)
IgM, Qn, Serum: 54 mg/dL (ref 26–217)
TOTAL PROTEIN: 5.8 g/dL — AB (ref 6.0–8.5)

## 2017-04-04 ENCOUNTER — Telehealth: Payer: Self-pay | Admitting: Oncology

## 2017-04-04 ENCOUNTER — Ambulatory Visit (HOSPITAL_BASED_OUTPATIENT_CLINIC_OR_DEPARTMENT_OTHER): Payer: 59 | Admitting: Oncology

## 2017-04-04 VITALS — BP 125/76 | HR 73 | Temp 98.0°F | Resp 18 | Ht 61.0 in | Wt 126.3 lb

## 2017-04-04 DIAGNOSIS — E8581 Light chain (AL) amyloidosis: Secondary | ICD-10-CM | POA: Diagnosis not present

## 2017-04-04 NOTE — Telephone Encounter (Signed)
Gave avs and calendar for march and april °

## 2017-04-04 NOTE — Progress Notes (Signed)
Hematology and Oncology Follow Up Visit  Anna Calderon [id number] [birth year] 56 y.o. 04/04/2017 12:00 PM System, Pcp Not InNo ref. 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 Her disease involed the bone marrow as well as the kidneys. She has potentially cardiac and neurological involvement.   Prior Therapy: She is status post kidney biopsy done on 08/10/2014 and showed positive staining for amyloidosis.   She is status post bone marrow biopsy on 09/20/2014 which showed 19% plasma cell infiltration suggesting plasma cell neoplasm.  Velcade, Cytoxan and dexamethasone as follows:  Velcade at 1.5 mg/m subcutaneously on a weekly basis. Cyclophosphamide 300 mg/m (total dose of close to 450 mg) on a weekly basis. Dexamethasone at 20 mg by mouth (reduced from 40 mg because of her fluid retention) on a weekly basis.   Therapy started on 11/24/2014 till 07/06/2015. Therapy discontinued given her complete response.  Current therapy: Observation and surveillance.   Interim History:   Anna Calderon presents today for a follow-up visit with her husband. Since her last visit, she reports no changes in her health. She denied any dysphagia and exertion or difficulty with urination. She denied any lower extremity edema or early satiety. She denies any excessive fatigue or tiredness. He does not report any shortness of breath or dyspnea on exertion. She has resumed all activities of daily living without any decline. She is able to resume all activities of daily living. She does not report any headaches, blurry vision, syncope or seizures. She denied any chest pain or shortness of breath.  She did not report any easy bruisability or petechiae.  She does not report any fevers, chills, sweats, weight loss. Did not report any nausea, vomiting. She does not report any urgency or  hesitancy. She did not report any hematuria. She did not report any skeletal pain, lymphadenopathy or easy bruisability. The remaining review of systems unremarkable  Medications: I have reviewed the patient's current medications.  Current Outpatient Prescriptions  Medication Sig Dispense Refill  . levothyroxine (SYNTHROID, LEVOTHROID) 50 MCG tablet Take 1 tablet by mouth daily.     No current facility-administered medications for this visit.      Allergies: No Known Allergies  Past Medical History, Surgical history, Social history, and Family History were reviewed and updated.   Physical Exam: Blood pressure 125/76, pulse 73, temperature 98 F (36.7 C), temperature source Oral, resp. rate 18, height _0  (1.549 m), weight 126 lb 4.8 oz (57.3 kg), SpO2 100 %.  ECOG: 1 General appearance:Well-appearing woman without distress. Head: Normocephalic, without obvious abnormality  No oral ulcers or lesions. Neck: no adenopathy Lymph nodes: Cervical, supraclavicular, and axillary nodes normal. Heart:regular rate and rhythm, S1, S2 normal, no murmur, click, rub or gallop Lung:chest clear, no wheezing, rales, normal symmetric air entry.  Abdomin: soft, non-tender, without masses or organomegaly.  No rebound or guarding. EXT: No edema noted. Skin: No ecchymosis or petechiae.  Lab Results: Lab Results  Component Value Date   WBC 4.1 03/28/2017   HGB 13.5 03/28/2017   HCT 40.5 03/28/2017   MCV 87.1 03/28/2017   PLT 159 03/28/2017     Chemistry      Component Value Date/Time   NA 141 03/28/2017 0851   K 3.8 03/28/2017 0851   CL 107 03/13/2015 0531   CO2 27 03/28/2017 0851   BUN 12.6 03/28/2017 0851   CREATININE 0.7  03/28/2017 0851      Component Value Date/Time   CALCIUM 9.1 03/28/2017 0851   ALKPHOS 51 03/28/2017 0851   AST 15 03/28/2017 0851   ALT 8 03/28/2017 0851   BILITOT 0.82 03/28/2017 0851     Results for MERIBETH, VITUG (MRN [id number]) as of 04/04/2017 11:46   Ref. Range 03/28/2017 08:52  Ig Kappa Free Light Chain Latest Ref Range: 3.3 - 19.4 mg/L 12.4  Ig Lambda Free Light Chain Latest Ref Range: 5.7 - 26.3 mg/L 9.1  Kappa/Lambda FluidC Ratio Latest Ref Range: 0.26 - 1.65  1.36      Impression and Plan:   56 year old woman with the following issues:  1. AL amyloidosis: Presented with a plasma cell disorder manifesting itself with bone marrow involvement with 19% plasma cells that are lambda light chain specific. Her free lambda light chain in the serum are elevated at 29 with At the lambda ratio depleted is 0.05. Staging CT scan did not show any evidence of clear-cut other organ involvement. 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   She is status post VCD therapy completed in January 2017 with a complete response.   Her protein studies from 03/28/2017 were personally reviewed today in continued to show complete response. She does not have any clinical signs or symptoms to suggest progression of disease or cardiac involvement.  We will continue to monitor her closely and repeat protein studies in 6 months.  2. Generalized anasarca: Resolved completely.  3. VZV prophylaxis: This has been discontinued after 6 months of therapy.  4. Cardiac complications of amyloid: She is following up with cardiology at [facility] regarding this issue. No recent complaints her exacerbations.  5. Follow-up: Will be in 6 months.Zola Button, MD  10/12/201812:00 PM

## 2017-04-11 ENCOUNTER — Other Ambulatory Visit: Payer: Self-pay

## 2017-09-15 ENCOUNTER — Telehealth: Payer: Self-pay | Admitting: Oncology

## 2017-09-15 NOTE — Telephone Encounter (Signed)
CME - moved 4/5 f/u to 4/16. Spoke with dtr she is aware. Other appointments remain the same.

## 2017-09-19 ENCOUNTER — Inpatient Hospital Stay: Payer: Medicare Other | Attending: Oncology

## 2017-09-19 DIAGNOSIS — E8581 Light chain (AL) amyloidosis: Secondary | ICD-10-CM | POA: Diagnosis present

## 2017-09-19 LAB — CBC WITH DIFFERENTIAL/PLATELET
BASOS PCT: 0 %
Basophils Absolute: 0 10*3/uL (ref 0.0–0.1)
EOS ABS: 0.1 10*3/uL (ref 0.0–0.5)
Eosinophils Relative: 3 %
HCT: 41 % (ref 34.8–46.6)
HEMOGLOBIN: 13.5 g/dL (ref 11.6–15.9)
Lymphocytes Relative: 24 %
Lymphs Abs: 1.2 10*3/uL (ref 0.9–3.3)
MCH: 29.4 pg (ref 25.1–34.0)
MCHC: 32.9 g/dL (ref 31.5–36.0)
MCV: 89.3 fL (ref 79.5–101.0)
MONOS PCT: 9 %
Monocytes Absolute: 0.4 10*3/uL (ref 0.1–0.9)
NEUTROS PCT: 64 %
Neutro Abs: 3.2 10*3/uL (ref 1.5–6.5)
PLATELETS: 162 10*3/uL (ref 145–400)
RBC: 4.59 MIL/uL (ref 3.70–5.45)
RDW: 13.7 % (ref 11.2–14.5)
WBC: 4.9 10*3/uL (ref 3.9–10.3)

## 2017-09-19 LAB — COMPREHENSIVE METABOLIC PANEL
ALK PHOS: 54 U/L (ref 40–150)
ALT: 13 U/L (ref 0–55)
AST: 15 U/L (ref 5–34)
Albumin: 3.8 g/dL (ref 3.5–5.0)
Anion gap: 7 (ref 3–11)
BUN: 11 mg/dL (ref 7–26)
CALCIUM: 9.3 mg/dL (ref 8.4–10.4)
CO2: 27 mmol/L (ref 22–29)
CREATININE: 0.72 mg/dL (ref 0.60–1.10)
Chloride: 106 mmol/L (ref 98–109)
Glucose, Bld: 89 mg/dL (ref 70–140)
Potassium: 3.8 mmol/L (ref 3.5–5.1)
SODIUM: 140 mmol/L (ref 136–145)
Total Bilirubin: 0.4 mg/dL (ref 0.2–1.2)
Total Protein: 6.7 g/dL (ref 6.4–8.3)

## 2017-09-22 LAB — KAPPA/LAMBDA LIGHT CHAINS
KAPPA FREE LGHT CHN: 13.5 mg/L (ref 3.3–19.4)
Kappa, lambda light chain ratio: 1.41 (ref 0.26–1.65)
Lambda free light chains: 9.6 mg/L (ref 5.7–26.3)

## 2017-09-23 LAB — MULTIPLE MYELOMA PANEL, SERUM
ALBUMIN/GLOB SERPL: 1.3 (ref 0.7–1.7)
ALPHA 1: 0.2 g/dL (ref 0.0–0.4)
ALPHA2 GLOB SERPL ELPH-MCNC: 0.8 g/dL (ref 0.4–1.0)
Albumin SerPl Elph-Mcnc: 3.5 g/dL (ref 2.9–4.4)
B-GLOBULIN SERPL ELPH-MCNC: 0.8 g/dL (ref 0.7–1.3)
GAMMA GLOB SERPL ELPH-MCNC: 0.9 g/dL (ref 0.4–1.8)
GLOBULIN, TOTAL: 2.7 g/dL (ref 2.2–3.9)
IGG (IMMUNOGLOBIN G), SERUM: 889 mg/dL (ref 700–1600)
IGM (IMMUNOGLOBULIN M), SRM: 65 mg/dL (ref 26–217)
IgA: 53 mg/dL — ABNORMAL LOW (ref 87–352)
Total Protein ELP: 6.2 g/dL (ref 6.0–8.5)

## 2017-09-26 ENCOUNTER — Ambulatory Visit: Payer: Self-pay | Admitting: Oncology

## 2017-10-02 ENCOUNTER — Encounter: Payer: Self-pay | Admitting: *Deleted

## 2017-10-07 ENCOUNTER — Inpatient Hospital Stay: Payer: Medicare Other | Attending: Oncology | Admitting: Oncology

## 2017-10-07 ENCOUNTER — Telehealth: Payer: Self-pay | Admitting: Oncology

## 2017-10-07 VITALS — BP 123/79 | HR 66 | Temp 97.9°F | Resp 18 | Ht 61.0 in | Wt 122.9 lb

## 2017-10-07 DIAGNOSIS — M545 Low back pain, unspecified: Secondary | ICD-10-CM

## 2017-10-07 DIAGNOSIS — C9000 Multiple myeloma not having achieved remission: Secondary | ICD-10-CM

## 2017-10-07 DIAGNOSIS — Z79899 Other long term (current) drug therapy: Secondary | ICD-10-CM | POA: Insufficient documentation

## 2017-10-07 DIAGNOSIS — E8581 Light chain (AL) amyloidosis: Secondary | ICD-10-CM | POA: Insufficient documentation

## 2017-10-07 NOTE — Telephone Encounter (Signed)
Appts scheduled AVS/Calendar printed per 4/16 los °

## 2017-10-07 NOTE — Progress Notes (Signed)
Hematology and Oncology Follow Up Visit  MILANI LOWENSTEIN [id number] 1961-02-28 57 y.o. 10/07/2017 12:48 PM System, Pcp Not InNo ref. 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   Prior Therapy: She is status post kidney biopsy done on 08/10/2014 and showed positive staining for amyloidosis.   She is status post bone marrow biopsy on 09/20/2014 which showed 19% plasma cell infiltration suggesting plasma cell neoplasm.  Velcade at 1.5 mg/m subcutaneously on a weekly basis, Cyclophosphamide 300 mg/m (total dose of close to 450 mg) on a weekly basis, Dexamethasone at 20 mg by mouth weekly.  Therapy given between 11/24/2014 till 07/06/2015. Therapy discontinued given her complete response.    Current therapy: Active surveillance.   Interim History:   Mrs. Anschutz presents today for a follow-up visit with her daughter.  Since her last visit, she has been complaining of lower back pain.  Her pain is predominantly on the left side radiating down to the middle of her buttocks.  She reports her pain gets exacerbated by sitting and improved by mobility.  She denies any neurological deficits or weakness.  She denies any neuropathy.  She denies any changes in her bowel habits.  She denies any loss of bowel or bladder function.  He is intentionally losing weight because of elevated cholesterol.  She denies any recent trauma or falls.  She has not been taking any pain medication for her back.  She does not report any headaches, blurry vision, syncope or seizures.  She denies any fevers, chills or sweats.  She denied any chest pain or shortness of breath.  She does not report any cough, wheezing or hemoptysis.  Did not report any nausea, vomiting.  She denies any constipation, diarrhea or hematochezia.  She does not report any urgency or hesitancy. She did not report any hematuria.  She denied any ymphadenopathy  or easy bruisability. The remaining review of systems is negative.  Medications: I have reviewed the patient's current medications.  Current Outpatient Medications  Medication Sig Dispense Refill  . levothyroxine (SYNTHROID, LEVOTHROID) 50 MCG tablet Take 1 tablet by mouth daily.     No current facility-administered medications for this visit.      Allergies: No Known Allergies  Past Medical History, Surgical history, Social history, and Family History were reviewed and updated.   Physical Exam: Blood pressure 123/79, pulse 66, temperature 97.9 F (36.6 C), temperature source Oral, resp. rate 18, height '5\' 1"'  (1.549 m), weight 122 lb 14.4 oz (55.7 kg), SpO2 99 %.   ECOG: 1 General appearance: Alert, awake gentleman without distress. Head: Atraumatic without abnormalities. Oropharynx: Without any thrush or ulcers. Eyes: No scleral icterus. Lymph nodes: No lymphadenopathy noted in the cervical, axillary or supraclavicular regions. Heart: Regular rate and rhythm without any murmurs or gallops. Lung: Clear to auscultation without any rhonchi, wheezes or dullness to percussion. Abdomin: Soft, nontender without any rebound or guarding. Musculoskeletal: No joint deformity or effusion.  Full range of motion noted in both hips. Back inspection: Showed no abnormalities or tenderness on palpation. Skin: No petechiae or rash. Neurological: No motor or sensory deficits.  Ambulating without any difficulties.  Lab Results: Lab Results  Component Value Date   WBC 4.9 09/19/2017   HGB 13.5 09/19/2017   HCT 41.0 09/19/2017   MCV 89.3 09/19/2017   PLT 162 09/19/2017     Chemistry      Component Value Date/Time  NA 140 09/19/2017 0856   NA 141 03/28/2017 0851   K 3.8 09/19/2017 0856   K 3.8 03/28/2017 0851   CL 106 09/19/2017 0856   CO2 27 09/19/2017 0856   CO2 27 03/28/2017 0851   BUN 11 09/19/2017 0856   BUN 12.6 03/28/2017 0851   CREATININE 0.72 09/19/2017 0856   CREATININE  0.7 03/28/2017 0851      Component Value Date/Time   CALCIUM 9.3 09/19/2017 0856   CALCIUM 9.1 03/28/2017 0851   ALKPHOS 54 09/19/2017 0856   ALKPHOS 51 03/28/2017 0851   AST 15 09/19/2017 0856   AST 15 03/28/2017 0851   ALT 13 09/19/2017 0856   ALT 8 03/28/2017 0851   BILITOT 0.4 09/19/2017 0856   BILITOT 0.82 03/28/2017 0851     Results for CIERAH, CRADER (MRN [id number]) as of 04/04/2017 11:46  Ref. Range 03/28/2017 08:52  Ig Kappa Free Light Chain Latest Ref Range: 3.3 - 19.4 mg/L 12.4  Ig Lambda Free Light Chain Latest Ref Range: 5.7 - 26.3 mg/L 9.1  Kappa/Lambda FluidC Ratio Latest Ref Range: 0.26 - 1.65  1.36      Impression and Plan:   57 year old woman with the following issues:  1. AL amyloidosis with kidney, cardiac and neurological involvement. She is status post VCD therapy completed in January 2017 with a complete response.   Protein studies obtained in March 2019 were reviewed today continues to show complete response with her serum light chains.  The natural course of this disease was discussed today with the patient and continue to be in remission.  Her case was discussed with Dr. Amalia Hailey which she sees periodically for this issue.  Her disease continues to be in remission and active surveillance is recommended.  2.  Back pain: The differential diagnosis was discussed with the patient.  Multiple myeloma with bone involvement would be a possibility.  Spinal stenosis or disc disease is also a possibility.  Will obtain MRI of the lumbar spine to evaluate these options.  Given her history of plasma cell disorder, ruling out malignancy is important.  3. Cardiac complications of amyloid: No recent exacerbations noted.  Continues to follow with [facility] cardiology.  5. Follow-up: Will be in 6 months.  15  minutes was spent with the patient face-to-face today.  More than 50% of time was dedicated to patient counseling, education and coordination of  her multifaceted care.     Zola Button, MD  4/16/201912:48 PM

## 2017-10-14 ENCOUNTER — Ambulatory Visit (HOSPITAL_COMMUNITY)
Admission: RE | Admit: 2017-10-14 | Discharge: 2017-10-14 | Disposition: A | Discharge status: Home / Self Care | Payer: Medicare Other | Source: Ambulatory Visit | Attending: Oncology | Admitting: Oncology

## 2017-10-14 DIAGNOSIS — M4316 Spondylolisthesis, lumbar region: Secondary | ICD-10-CM | POA: Insufficient documentation

## 2017-10-14 DIAGNOSIS — M5136 Other intervertebral disc degeneration, lumbar region: Secondary | ICD-10-CM | POA: Insufficient documentation

## 2017-10-14 DIAGNOSIS — R937 Abnormal findings on diagnostic imaging of other parts of musculoskeletal system: Secondary | ICD-10-CM | POA: Insufficient documentation

## 2017-10-14 DIAGNOSIS — M5137 Other intervertebral disc degeneration, lumbosacral region: Secondary | ICD-10-CM | POA: Insufficient documentation

## 2017-10-14 DIAGNOSIS — M545 Low back pain, unspecified: Secondary | ICD-10-CM

## 2017-10-14 DIAGNOSIS — C9000 Multiple myeloma not having achieved remission: Secondary | ICD-10-CM | POA: Insufficient documentation

## 2017-10-14 DIAGNOSIS — E8581 Light chain (AL) amyloidosis: Secondary | ICD-10-CM | POA: Diagnosis not present

## 2017-10-14 DIAGNOSIS — M47816 Spondylosis without myelopathy or radiculopathy, lumbar region: Secondary | ICD-10-CM | POA: Insufficient documentation

## 2017-10-14 MED ORDER — GADOBENATE DIMEGLUMINE 529 MG/ML IV SOLN
15.0000 mL | Freq: Once | INTRAVENOUS | Status: AC | PRN
  Administered 2017-10-14: 11 mL via INTRAVENOUS

## 2018-04-22 ENCOUNTER — Telehealth: Payer: Self-pay | Admitting: Oncology

## 2018-04-22 ENCOUNTER — Inpatient Hospital Stay: Payer: Medicare Other | Attending: Oncology | Admitting: Oncology

## 2018-04-22 VITALS — BP 125/73 | HR 71 | Temp 97.7°F | Resp 17 | Ht 61.0 in | Wt 123.7 lb

## 2018-04-22 DIAGNOSIS — Z79899 Other long term (current) drug therapy: Secondary | ICD-10-CM | POA: Diagnosis not present

## 2018-04-22 DIAGNOSIS — I429 Cardiomyopathy, unspecified: Secondary | ICD-10-CM

## 2018-04-22 DIAGNOSIS — E859 Amyloidosis, unspecified: Secondary | ICD-10-CM | POA: Insufficient documentation

## 2018-04-22 DIAGNOSIS — M4800 Spinal stenosis, site unspecified: Secondary | ICD-10-CM

## 2018-04-22 DIAGNOSIS — M549 Dorsalgia, unspecified: Secondary | ICD-10-CM

## 2018-04-22 NOTE — Telephone Encounter (Signed)
Appts scheduled avs/calendar printed per 10/30 los °

## 2018-04-22 NOTE — Progress Notes (Signed)
Hematology and Oncology Follow Up Visit  KATHERIN RAMEY [id number] 1961-05-19 57 y.o. 04/22/2018 10:36 AM Arvella Nigh, MDNo ref. 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    Prior Therapy: She is status post kidney biopsy done on 08/10/2014 and showed positive staining for amyloidosis.   She is status post bone marrow biopsy on 09/20/2014 which showed 19% plasma cell infiltration suggesting plasma cell neoplasm.  Velcade at 1.5 mg/m subcutaneously on a weekly basis, Cyclophosphamide 300 mg/m (total dose of close to 450 mg) on a weekly basis, Dexamethasone at 20 mg by mouth weekly.  Therapy given between 11/24/2014 till 07/06/2015. Therapy discontinued given her complete response.    Current therapy: Active surveillance.   Interim History:   Mrs. Vogl is here for repeat evaluation.  Since the last visit, she reports no major changes in her health.  She remains active and attends to activities of daily living.  She continues to have back pain issues predominantly after increased mobility.  Her MRI in April of this year showed spinal stenosis.  She denies any lower extremity edema, pathological fractures or dyspnea on exertion.  Her performance status and quality of life remains unchanged.  She does not report any headaches, blurry vision, syncope or seizures.  She denies any alteration mental status or confusion.  She denies any fevers, chills or sweats.  She denied any chest pain or shortness of breath.  She does not report any cough, wheezing or hemoptysis.  Did not report any nausea, vomiting.  Does not report any changes in her bowels.  She denies any bleeding or clotting tendency..  She does not report any urgency or hesitancy. She did not report any hematuria or dysuria.  She denied any skin rashes or lesions.  She denies any mood changes.  She denies any heat or cold  intolerance.  The remaining review of systems is negative.  Medications: I have reviewed the patient's current medications.  Current Outpatient Medications  Medication Sig Dispense Refill  . levothyroxine (SYNTHROID, LEVOTHROID) 50 MCG tablet Take 1 tablet by mouth daily.     No current facility-administered medications for this visit.      Allergies: No Known Allergies  Past Medical History, Surgical history, Social history, and Family History were reviewed and no changes documented.   Physical Exam: Blood pressure 125/73, pulse 71, temperature 97.7 F (36.5 C), temperature source Oral, resp. rate 17, height _0  (1.549 m), weight 123 lb 11.2 oz (56.1 kg), SpO2 100 %.    ECOG: 1   General appearance: Comfortable appearing without any discomfort Head: Normocephalic without any trauma Oropharynx: Mucous membranes are moist and pink without any thrush or ulcers. Eyes: Pupils are equal and round reactive to light. Lymph nodes: No cervical, supraclavicular, inguinal or axillary lymphadenopathy.   Heart:regular rate and rhythm.  S1 and S2 without leg edema. Lung: Clear without any rhonchi or wheezes.  No dullness to percussion. Abdomin: Soft, nontender, nondistended with good bowel sounds.  No hepatosplenomegaly. Musculoskeletal: No joint deformity or effusion.  Full range of motion noted. Neurological: No deficits noted on motor, sensory and deep tendon reflex exam. Skin: No petechial rash or dryness.  Appeared moist.    Lab Results: Lab Results  Component Value Date   WBC 4.9 09/19/2017   HGB 13.5 09/19/2017   HCT 41.0 09/19/2017   MCV 89.3 09/19/2017   PLT 162  09/19/2017     Chemistry      Component Value Date/Time   NA 140 09/19/2017 0856   NA 141 03/28/2017 0851   K 3.8 09/19/2017 0856   K 3.8 03/28/2017 0851   CL 106 09/19/2017 0856   CO2 27 09/19/2017 0856   CO2 27 03/28/2017 0851   BUN 11 09/19/2017 0856   BUN 12.6 03/28/2017 0851   CREATININE 0.72  09/19/2017 0856   CREATININE 0.7 03/28/2017 0851      Component Value Date/Time   CALCIUM 9.3 09/19/2017 0856   CALCIUM 9.1 03/28/2017 0851   ALKPHOS 54 09/19/2017 0856   ALKPHOS 51 03/28/2017 0851   AST 15 09/19/2017 0856   AST 15 03/28/2017 0851   ALT 13 09/19/2017 0856   ALT 8 03/28/2017 0851   BILITOT 0.4 09/19/2017 0856   BILITOT 0.82 03/28/2017 0851      Results for MAKIA, BOSSI (MRN [id number]) as of 04/22/2018 10:17  Ref. Range 09/19/2017 08:56  Kappa free light chain Latest Ref Range: 3.3 - 19.4 mg/L 13.5  Lamda free light chains Latest Ref Range: 5.7 - 26.3 mg/L 9.6  Kappa, lamda light chain ratio Latest Ref Range: 0.26 - 1.65  1.41      Impression and Plan:   57 year old woman with the following issues:  1.  Amyloidosis with kidney, bone marrow and cardiac involvement diagnosed in 2016.  She has completed induction therapy in 2017 and remains in remission.  She continues to follow at [facility] regarding these issues and her laboratory testing in October of this year did not show any evidence of disease relapse.  The natural course of her disease was reviewed today and the plan is to continue with active surveillance.  She will have repeat laboratory testing in 6 months at [facility].  He will have an MD follow-up at that time after these labs are done.  2.  Back pain: Related to spinal stenosis.  MRI obtained on October 14, 2017 did not show any malignancy.  I recommended physical therapy and made the appropriate referral for her at this time.  3.  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  She continues to follow with cardiology at [facility] without any decline in her cardiac function.  4. Follow-up: Will be in 6 months.  15  minutes was spent with the patient face-to-face today.  More than 50% of time was dedicated to reviewing her disease status, treatment options and coordinating plan of  care.    Zola Button, MD  10/30/201910:36 AM

## 2018-05-27 ENCOUNTER — Other Ambulatory Visit: Payer: Self-pay | Admitting: Oncology

## 2018-05-27 DIAGNOSIS — M4800 Spinal stenosis, site unspecified: Secondary | ICD-10-CM

## 2018-06-04 ENCOUNTER — Ambulatory Visit: Payer: Medicare Other | Attending: Oncology

## 2018-06-04 ENCOUNTER — Other Ambulatory Visit: Payer: Self-pay

## 2018-06-04 DIAGNOSIS — M545 Low back pain: Secondary | ICD-10-CM | POA: Insufficient documentation

## 2018-06-04 DIAGNOSIS — R293 Abnormal posture: Secondary | ICD-10-CM | POA: Insufficient documentation

## 2018-06-04 DIAGNOSIS — M48061 Spinal stenosis, lumbar region without neurogenic claudication: Secondary | ICD-10-CM | POA: Insufficient documentation

## 2018-06-04 DIAGNOSIS — G8929 Other chronic pain: Secondary | ICD-10-CM | POA: Insufficient documentation

## 2018-06-04 NOTE — Patient Instructions (Signed)
Knee to chest 30 sec 3 reps 3x/day     Lateral shift hips to RT 3 reps 3-5x/day 3 sed hold

## 2018-06-04 NOTE — Therapy (Signed)
[place], [place], [address] Phone: [phone]   Fax:  [phone]  Physical Therapy Evaluation  Patient Details  Name: Anna Calderon MRN: [id number] Date of Birth: [birth year] Referring Provider (PT): Zola Button, MD   Encounter Date: 06/04/2018  PT End of Session - 06/04/18 0900    Visit Number  1    Number of Visits  12    Date for PT Re-Evaluation  07/17/18    Authorization Type  MCR    PT Start Time  0920    PT Stop Time  1000    PT Time Calculation (min)  40 min    Activity Tolerance  Patient tolerated treatment well    Behavior During Therapy  [facility] for tasks assessed/performed       Past Medical History:  Diagnosis Date  . Cancer ([place])   . Edema   . Proteinuria     Past Surgical History:  Procedure Laterality Date  . TUBAL LIGATION      There were no vitals filed for this visit.   Subjective Assessment - 06/04/18 0920    Subjective  She reports onset of back pain without injury but husband relates lifting something and a few days later pain started.     Patient is accompained by:  Family member   husband here to translate   Limitations  House hold activities;Lifting    How long can you sit comfortably?  as needed if on firm chair    How long can you stand comfortably?  30 min     How long can you walk comfortably?  1/2 mile     Diagnostic tests  Xray / MRI;  stenosis    Patient Stated Goals  Get HEP for pain.     Currently in Pain?  No/denies    Pain Score  --   after standing 30 min pain  no able to give number.    Pain Location  Back    Pain Orientation  Lower   central    Pain Descriptors / Indicators  Pressure    Pain Type  Chronic pain    Pain Radiating Towards  Lt  and RT post to lateral ankle    Pain Onset  More than a month ago    Pain Frequency  Intermittent    Aggravating Factors   standing, lifting,  sit soft chair     Pain Relieving Factors  sit or lye          [facility] PT Assessment - 06/04/18 0001      Assessment   Medical Diagnosis  lumbar stenosis    Referring Provider (PT)  Zola Button, MD    Onset Date/Surgical Date  --   7 months ago   Next MD Visit  next year     Prior Therapy  no      Precautions   Precautions  None      Restrictions   Weight Bearing Restrictions  No      Balance Screen   Has the patient fallen in the past 6 months  No      Prior Function   Level of Independence  Needs assistance with homemaking   limits to 10 pounds   Vocation  On disability      Cognition   Overall Cognitive Status  Within Functional Limits for tasks assessed      Observation/Other Assessments   Focus on Therapeutic Outcomes (FOTO)  40% limited      Posture/Postural Control   Posture Comments  lateral shift to RT , decr lumbar lordosis, RT ilia lower than LT.       ROM / Strength   AROM / PROM / Strength  AROM;PROM;Strength      AROM   AROM Assessment Site  Lumbar    Lumbar Flexion  90    Lumbar Extension  25    Lumbar - Right Side Bend  20    Lumbar - Left Side Bend  20                Objective measurements completed on examination: See above findings.              PT Education - 06/04/18 1006    Education Details  POC , HEP    Person(s) Educated  Patient;Spouse    Methods  Explanation;Demonstration;Tactile cues;Verbal cues;Handout    Comprehension  Returned demonstration;Verbalized understanding       PT Short Term Goals - 06/04/18 0903      PT SHORT TERM GOAL #1   Title  She will be independent with initial hEp    Time  2    Period  Weeks    Status  New      PT SHORT TERM GOAL #2   Title  She will be able to verbalized activity and posture to decr pain    Time  2    Period  Weeks    Status  New        PT Long Term Goals - 06/04/18 7829      PT LONG TERM GOAL #1   Title  she will be indpendent with all hEp issued     Time  6    Period  Weeks    Status  New      PT LONG  TERM GOAL #2   Title  She will report able to minimize pain with exercises    Time  6    Period  Weeks    Status  New      PT LONG TERM GOAL #3   Title  FOTO score will improve 10 points to deo improvement in pain/function    Time  6    Period  Weeks    Status  New             Plan - 06/04/18 0901    Clinical Impression Statement  Pt presents with intermittant pain in lower back and legs. Pain onset mostly with standing activity and less with sitting and lying. She demo abnormal posture  but no spasm or tnedernes stoday.   With skilled PT to advance HEP she should improve in future    Clinical Presentation  Stable    Clinical Decision Making  Low    Rehab Potential  Good    PT Frequency  1x / week    PT Duration  6 weeks    PT Treatment/Interventions  Patient/family education;Therapeutic exercise;Manual techniques;Moist Heat    PT Next Visit Plan  Progress HEp   modalities if needed , manual    PT Home Exercise Plan  lateral shift , knee to chest    Consulted and Agree with Plan of Care  Patient;Family member/caregiver    Family Member Consulted  spouse       Patient will benefit from skilled therapeutic intervention in order to improve the following deficits and impairments:  Postural dysfunction, Pain, Decreased activity  tolerance  Visit Diagnosis: Chronic bilateral low back pain, unspecified whether sciatica present - Plan: PT plan of care cert/re-cert  Abnormal posture - Plan: PT plan of care cert/re-cert  Spinal stenosis of lumbar region, unspecified whether neurogenic claudication present - Plan: PT plan of care cert/re-cert     Problem List Patient Active Problem List   Diagnosis Date Noted  . Shock ([place])   . Blood poisoning   . Acute hyponatremia 03/12/2015  . Anemia of chronic disease, malignancy  03/12/2015  . Severe sepsis with septic shock ([place]) 03/08/2015  . Hypoalbuminemia 03/08/2015  . Anasarca 03/08/2015  . Hypokalemia 03/08/2015  . AL  amyloidosis ([place]) 11/15/2014    Darrel Hoover  PT 06/04/2018, 10:17 AM  [facility] [address] [place], [place], [address] Phone: [phone]   Fax:  [phone]  Name: SHAKEYA KERKMAN MRN: [id number] Date of Birth: Nov 20, 1960

## 2018-06-16 ENCOUNTER — Ambulatory Visit: Payer: Medicare Other | Admitting: Physical Therapy

## 2018-06-16 ENCOUNTER — Encounter: Payer: Self-pay | Admitting: Physical Therapy

## 2018-06-16 DIAGNOSIS — R293 Abnormal posture: Secondary | ICD-10-CM

## 2018-06-16 DIAGNOSIS — M545 Low back pain: Secondary | ICD-10-CM | POA: Diagnosis not present

## 2018-06-16 DIAGNOSIS — M48061 Spinal stenosis, lumbar region without neurogenic claudication: Secondary | ICD-10-CM

## 2018-06-16 DIAGNOSIS — G8929 Other chronic pain: Secondary | ICD-10-CM

## 2018-06-16 NOTE — Therapy (Signed)
[place], [place], [address] Phone: [phone]   Fax:  [phone]  Physical Therapy Treatment  Patient Details  Name: Anna Calderon MRN: [id number] Date of Birth: 19-Oct-1960 Referring Provider (PT): Zola Button, MD   Encounter Date: 06/16/2018  PT End of Session - 06/16/18 1250    Visit Number  2    Number of Visits  12    Date for PT Re-Evaluation  07/17/18    Authorization Type  MCR    PT Start Time  0847    PT Stop Time  0930    PT Time Calculation (min)  43 min    Activity Tolerance  Patient tolerated treatment well    Behavior During Therapy  [facility] for tasks assessed/performed       Past Medical History:  Diagnosis Date  . Cancer ([place])   . Edema   . Proteinuria     Past Surgical History:  Procedure Laterality Date  . TUBAL LIGATION      There were no vitals filed for this visit.  Subjective Assessment - 06/16/18 0846    Subjective  No pain has tingling into right leg to ankle.      Currently in Pain?  No/denies    Pain Location  Back    Pain Orientation  Lower    Pain Descriptors / Indicators  Tingling    Pain Type  Chronic pain    Pain Radiating Towards  into lateral ankle    Pain Frequency  Intermittent    Aggravating Factors   standing to  long    Pain Relieving Factors  sit or lye                       [facility] Adult PT Treatment/Exercise - 06/16/18 1246      Self-Care   Self-Care  Other Self-Care Comments    Other Self-Care Comments   scooting hips to side with sit to stand to decrease pain  afte sitting,  getting out of car.        Lumbar Exercises: Stretches   Other Lumbar Stretch Exercise  Mc Kenzie lateral technique .  all 3 pposiitions practiced and decreased pain.       Manual Therapy   Manual Therapy  Soft tissue mobilization    Soft tissue mobilization  to quadratus lumborum in sidelying over floded sheet  strumming to lengthen,  manual hip  depression with manual also paraspinals softened.  tingling reduced when supine.   tissue softened             PT Education - 06/16/18 0905    Education Details  HEP  posture    Person(s) Educated  Patient;Spouse    Methods  Explanation;Demonstration;Tactile cues;Verbal cues;Handout    Comprehension  Returned demonstration;Verbalized understanding       PT Short Term Goals - 06/04/18 0903      PT SHORT TERM GOAL #1   Title  She will be independent with initial hEp    Time  2    Period  Weeks    Status  New      PT SHORT TERM GOAL #2   Title  She will be able to verbalized activity and posture to decr pain    Time  2    Period  Weeks    Status  New        PT Long Term Goals - 06/04/18 6962  PT LONG TERM GOAL #1   Title  she will be indpendent with all hEp issued     Time  6    Period  Weeks    Status  New      PT LONG TERM GOAL #2   Title  She will report able to minimize pain with exercises    Time  6    Period  Weeks    Status  New      PT LONG TERM GOAL #3   Title  FOTO score will improve 10 points to deo improvement in pain/function    Time  6    Period  Weeks    Status  New            Plan - 06/16/18 1251    Clinical Impression Statement  progressing with HEP focus on decreasing lateral shift today.  Able to decrease tingling intermittantly now.   " Feels better " post session today.    PT Next Visit Plan  Progress HEp   modalities if needed , manual.  Husband expects a few more visits  for HEP development .    PT Home Exercise Plan  lateral shift , knee to chestMc Kenzie lateral techniques    Consulted and Agree with Plan of Care  Patient;Family member/caregiver    Family Member Consulted  spouse       Patient will benefit from skilled therapeutic intervention in order to improve the following deficits and impairments:     Visit Diagnosis: Chronic bilateral low back pain, unspecified whether sciatica present  Abnormal  posture  Spinal stenosis of lumbar region, unspecified whether neurogenic claudication present     Problem List Patient Active Problem List   Diagnosis Date Noted  . Shock ([place])   . Blood poisoning   . Acute hyponatremia 03/12/2015  . Anemia of chronic disease, malignancy  03/12/2015  . Severe sepsis with septic shock ([place]) 03/08/2015  . Hypoalbuminemia 03/08/2015  . Anasarca 03/08/2015  . Hypokalemia 03/08/2015  . 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 [place], [place], [address] Phone: [phone]   Fax:  [phone]  Name: Anna Calderon MRN: [id number] Date of Birth: [birth year]

## 2018-06-16 NOTE — Patient Instructions (Signed)
Mckenzie lateral techniques issued from ex drawer  All issued  hold 210 seconds to 3 minutes PRN to correct posture 5 to 1 X each

## 2018-06-23 ENCOUNTER — Encounter: Payer: Self-pay | Admitting: Physical Therapy

## 2018-06-23 ENCOUNTER — Ambulatory Visit: Payer: Medicare Other | Admitting: Physical Therapy

## 2018-06-23 DIAGNOSIS — G8929 Other chronic pain: Secondary | ICD-10-CM

## 2018-06-23 DIAGNOSIS — R293 Abnormal posture: Secondary | ICD-10-CM

## 2018-06-23 DIAGNOSIS — M545 Low back pain: Secondary | ICD-10-CM | POA: Diagnosis not present

## 2018-06-23 DIAGNOSIS — M48061 Spinal stenosis, lumbar region without neurogenic claudication: Secondary | ICD-10-CM

## 2018-06-23 NOTE — Patient Instructions (Signed)
Hold McKenzie exercises if increasing numbness noted.

## 2018-06-23 NOTE — Therapy (Signed)
[place], [place], [address] Phone: [phone]   Fax:  [phone]  Physical Therapy Treatment  Patient Details  Name: Anna Calderon MRN: [id number] Date of Birth: 09-03-1960 Referring Provider (PT): Zola Button, MD   Encounter Date: 06/23/2018  PT End of Session - 06/23/18 1310    Visit Number  3    Number of Visits  12    Date for PT Re-Evaluation  07/17/18    Authorization Type  MCR    PT Start Time  0848    PT Stop Time  0945    PT Time Calculation (min)  57 min    Activity Tolerance  Patient tolerated treatment well    Behavior During Therapy  [facility] for tasks assessed/performed       Past Medical History:  Diagnosis Date  . Cancer ([place])   . Edema   . Proteinuria     Past Surgical History:  Procedure Laterality Date  . TUBAL LIGATION      There were no vitals filed for this visit.  Subjective Assessment - 06/23/18 0900    Subjective  Lateral techniwues make leg left numb.  patient told to stop exercise.  In morning both legs feel numb and weak.  She is not afraid of falling.   She feels lke it is not bad enought to use a cane or a walker.    No ankle pain this week.  I have just numbness.      Patient is accompained by:  Family member   daughter   Currently in Pain?  No/denies    Pain Score  --   varies   Pain Location  Back    Pain Orientation  Lower;Left    Pain Descriptors / Indicators  Tingling;Numbness    Pain Radiating Towards  into both legs.      Aggravating Factors   getting up in am,  standing too long.     Pain Relieving Factors  walking,  sitting  lying.  exercising                       [facility] Adult PT Treatment/Exercise - 06/23/18 0001      Lumbar Exercises: Stretches   Single Knee to Chest Stretch  Left;Right;3 reps;30 seconds    Single Knee to Chest Stretch Limitations  WNL  knee touches chest both    Lower Trunk Rotation  3 reps;10 seconds    Piriformis Stretch  Right;Left;3 reps;30 seconds    Piriformis Stretch Limitations  cued initially    Other Lumbar Stretch Exercise  at wall Mc Kenzie lateral technique.    3 reps able to do with pain increase pain centralized          Lumbar Exercises: Supine   Ab Set  10 reps    AB Set Limitations  max cues initially    Pelvic Tilt  5 reps    Bent Knee Raise  10 reps    Bent Knee Raise Limitations  max cues,  language      Modalities   Modalities  Moist Heat      Moist Heat Therapy   Number Minutes Moist Heat  15 Minutes    Moist Heat Location  Lumbar Spine   Quadratus lumborum and hip left     Manual Therapy   Manual Therapy  Soft tissue mobilization    Soft tissue mobilization  instrument assist left gluteal and  paraspinals  QL less tender today.  tissue softened.               PT Education - 06/23/18 1310    Education Details  Exercise form.      Person(s) Educated  Patient;Child(ren)    Methods  Explanation;Demonstration;Verbal cues;Tactile cues    Comprehension  Returned demonstration       PT Short Term Goals - 06/23/18 1313      PT SHORT TERM GOAL #1   Title  She will be independent with initial hEp    Baseline  cued needed    Time  2    Period  Weeks    Status  On-going      PT SHORT TERM GOAL #2   Title  She will be able to verbalized activity and posture to decr pain    Baseline  unclear,      Time  2    Period  Weeks    Status  On-going        PT Long Term Goals - 06/04/18 0904      PT LONG TERM GOAL #1   Title  she will be indpendent with all hEp issued     Time  6    Period  Weeks    Status  New      PT LONG TERM GOAL #2   Title  She will report able to minimize pain with exercises    Time  6    Period  Weeks    Status  New      PT LONG TERM GOAL #3   Title  FOTO score will improve 10 points to deo improvement in pain/function    Time  6    Period  Weeks    Status  New            Plan - 06/23/18 1310    Clinical  Impression Statement  Patient is doing the HEP with numbness getting worse however when the same exercise are done in clinic they help decrease pain.  No longer is patient getting ankle pain.   There is a language barrier involved. patient's  adult daughter helped  clarify symptoms and exercises today.   No pain at end of session.     PT Next Visit Plan  Progress HEp   modalities if needed , manual.  Husband expects a few more visits  for HEP development .  flexion based    PT Home Exercise Plan  lateral shift , knee to chestMc Kenzie lateral techniques ( hold if making worse)    Consulted and Agree with Plan of Care  Patient;Family member/caregiver    Family Member Consulted  daughter       Patient will benefit from skilled therapeutic intervention in order to improve the following deficits and impairments:     Visit Diagnosis: Chronic bilateral low back pain, unspecified whether sciatica present  Abnormal posture  Spinal stenosis of lumbar region, unspecified whether neurogenic claudication present     Problem List Patient Active Problem List   Diagnosis Date Noted  . Shock ([place])   . Blood poisoning   . Acute hyponatremia 03/12/2015  . Anemia of chronic disease, malignancy  03/12/2015  . Severe sepsis with septic shock ([place]) 03/08/2015  . Hypoalbuminemia 03/08/2015  . Anasarca 03/08/2015  . Hypokalemia 03/08/2015  . 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

## 2018-06-30 ENCOUNTER — Ambulatory Visit: Payer: Medicare Other | Attending: Oncology

## 2018-06-30 DIAGNOSIS — M48061 Spinal stenosis, lumbar region without neurogenic claudication: Secondary | ICD-10-CM | POA: Diagnosis present

## 2018-06-30 DIAGNOSIS — M545 Low back pain: Secondary | ICD-10-CM | POA: Insufficient documentation

## 2018-06-30 DIAGNOSIS — G8929 Other chronic pain: Secondary | ICD-10-CM | POA: Insufficient documentation

## 2018-06-30 DIAGNOSIS — R293 Abnormal posture: Secondary | ICD-10-CM | POA: Diagnosis present

## 2018-06-30 NOTE — Patient Instructions (Signed)
Quadraped leg ext , Pilates APPI abdominal prep, arm lifts with ab set, and bent leg raise x 10-20 1x/day

## 2018-06-30 NOTE — Therapy (Signed)
[place], [place], [address] Phone: [phone]   Fax:  [phone]  Physical Therapy Treatment  Patient Details  Name: RANEY ANTWINE MRN: [id number] Date of Birth: [birth year] Referring Provider (PT): Zola Button, MD   Encounter Date: 06/30/2018  PT End of Session - 06/30/18 0851    Visit Number  4    Number of Visits  12    Date for PT Re-Evaluation  07/17/18    Authorization Type  MCR    PT Start Time  0850    PT Stop Time  0930    PT Time Calculation (min)  40 min    Activity Tolerance  Patient tolerated treatment well    Behavior During Therapy  [facility] for tasks assessed/performed       Past Medical History:  Diagnosis Date  . Cancer ([place])   . Edema   . Proteinuria     Past Surgical History:  Procedure Laterality Date  . TUBAL LIGATION      There were no vitals filed for this visit.  Subjective Assessment - 06/30/18 0852    Subjective  She reports she is better with exercise    Pain Score  3     Pain Location  Back    Pain Orientation  Left;Lower    Pain Descriptors / Indicators  Tightness    Pain Type  Chronic pain    Pain Onset  More than a month ago    Pain Frequency  Intermittent    Aggravating Factors   AM , standing     Pain Relieving Factors  walk  , sit, lye                         [facility] Adult PT Treatment/Exercise - 06/30/18 0001      Lumbar Exercises: Stretches   Single Knee to Chest Stretch  Right;Left;2 reps;30 seconds    Lower Trunk Rotation  2 reps;30 seconds      Lumbar Exercises: Supine   Pelvic Tilt  10 reps;5 seconds    Bent Knee Raise  10 reps    Bent Knee Raise Limitations  cued to set abs     Other Supine Lumbar Exercises  part sit up  iwth lift head shoulder s and reach to ankles      Lumbar Exercises: Quadruped   Straight Leg Raise  10 reps    Straight Leg Raises Limitations  RT/LT  2x 30 sec childs pose post             PT  Education - 06/30/18 0923    Education Details  HEP    Person(s) Educated  Patient    Methods  Explanation;Demonstration;Tactile cues;Handout;Verbal cues    Comprehension  Returned demonstration;Verbalized understanding       PT Short Term Goals - 06/23/18 1313      PT SHORT TERM GOAL #1   Title  She will be independent with initial hEp    Baseline  cued needed    Time  2    Period  Weeks    Status  On-going      PT SHORT TERM GOAL #2   Title  She will be able to verbalized activity and posture to decr pain    Baseline  unclear,      Time  2    Period  Weeks    Status  On-going  PT Long Term Goals - 06/04/18 0904      PT LONG TERM GOAL #1   Title  she will be indpendent with all hEp issued     Time  6    Period  Weeks    Status  New      PT LONG TERM GOAL #2   Title  She will report able to minimize pain with exercises    Time  6    Period  Weeks    Status  New      PT LONG TERM GOAL #3   Title  FOTO score will improve 10 points to deo improvement in pain/function    Time  6    Period  Weeks    Status  New            Plan - 06/30/18 8546    Clinical Impression Statement  Appears to be improving with less pain and numbness . She is able to manage pain with exercise for most part.   No need for  heat today    PT Treatment/Interventions  Patient/family education;Therapeutic exercise;Manual techniques;Moist Heat    PT Next Visit Plan  Progress HEp   modalities if needed , manual.  Husband expects a few more visits  for HEP development .  flexion based    PT Home Exercise Plan  lateral shift , knee to chestMc Kenzie lateral techniques ( hold if making worse), pilates bent knee raise , ab prep,  bilateral arm raise with ab set.  quadraped leg reach     Consulted and Agree with Plan of Care  Patient       Patient will benefit from skilled therapeutic intervention in order to improve the following deficits and impairments:  Postural dysfunction, Pain,  Decreased activity tolerance  Visit Diagnosis: Chronic bilateral low back pain, unspecified whether sciatica present  Abnormal posture  Spinal stenosis of lumbar region, unspecified whether neurogenic claudication present     Problem List Patient Active Problem List   Diagnosis Date Noted  . Shock ([place])   . Blood poisoning   . Acute hyponatremia 03/12/2015  . Anemia of chronic disease, malignancy  03/12/2015  . Severe sepsis with septic shock ([place]) 03/08/2015  . Hypoalbuminemia 03/08/2015  . Anasarca 03/08/2015  . Hypokalemia 03/08/2015  . 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 [place], [place], [address] Phone: [phone]   Fax:  [phone]  Name: JOLLEEN SEMAN MRN: [id number] Date of Birth: 22-Dec-1960

## 2018-07-07 ENCOUNTER — Ambulatory Visit: Payer: Medicare Other

## 2018-07-07 DIAGNOSIS — M545 Low back pain: Principal | ICD-10-CM

## 2018-07-07 DIAGNOSIS — M48061 Spinal stenosis, lumbar region without neurogenic claudication: Secondary | ICD-10-CM

## 2018-07-07 DIAGNOSIS — R293 Abnormal posture: Secondary | ICD-10-CM

## 2018-07-07 DIAGNOSIS — G8929 Other chronic pain: Secondary | ICD-10-CM

## 2018-07-07 NOTE — Therapy (Signed)
[place], [place], [address] Phone: [phone]   Fax:  [phone]  Physical Therapy Treatment/ Discharge  Patient Details  Name: Anna Calderon MRN: [id number] Date of Birth: 12-14-1960 Referring Provider (PT): Zola Button, MD   Encounter Date: 07/07/2018  PT End of Session - 07/07/18 0845    Visit Number  5    Number of Visits  12    Date for PT Re-Evaluation  07/17/18    Authorization Type  MCR    PT Start Time  0845    PT Stop Time  0925    PT Time Calculation (min)  40 min    Activity Tolerance  Patient tolerated treatment well    Behavior During Therapy  [facility] for tasks assessed/performed       Past Medical History:  Diagnosis Date  . Cancer ([place])   . Edema   . Proteinuria     Past Surgical History:  Procedure Laterality Date  . TUBAL LIGATION      There were no vitals filed for this visit.  Subjective Assessment - 07/07/18 0849    Subjective  Woke with LT leg pain . Now only mild but still to below knee lateral aspect.       Feel slept on that side.     Currently in Pain?  Yes    Pain Score  3     Pain Location  Hip    Pain Orientation  Left    Pain Descriptors / Indicators  Tightness    Pain Type  Chronic pain    Pain Radiating Towards  Lt lateral leg to below knee    Pain Onset  More than a month ago    Pain Frequency  Intermittent    Aggravating Factors   AM , standing          All HEP reviewed and she was able to do all correctly. Knee to chest decr LT leg pain. She felt she did not need more exercises                       PT Short Term Goals - 07/07/18 0926      PT SHORT TERM GOAL #1   Title  She will be independent with initial hEp    Status  Achieved      PT SHORT TERM GOAL #2   Title  She will be able to verbalized activity and posture to decr pain    Status  Partially Met        PT Long Term Goals - 07/07/18 0926      PT LONG TERM  GOAL #1   Title  she will be indpendent with all hEp issued     Status  Achieved      PT LONG TERM GOAL #2   Title  She will report able to minimize pain with exercises    Baseline  knee to chest generally decr pain    Status  Achieved      PT LONG TERM GOAL #3   Title  FOTO score will improve 10 points to deo improvement in pain/function    Baseline  inc 3 points but she needed PT to help direct answers    Status  Not Met            Plan - 07/07/18 0846    Clinical Impression Statement  Anna Calderon reports pleased with progress and  is ready for discharge. She is independent with her HEP and her pain levels have decr and she  reports these pains as intermittant though she stiull has leg symptoms at times.     PT Treatment/Interventions  Patient/family education;Therapeutic exercise;Manual techniques;Moist Heat    PT Next Visit Plan  Progress HEp   modalities if needed , manual.  Husband expects a few more visits  for HEP development .  flexion based    PT Home Exercise Plan  lateral shift , knee to chestMc Kenzie lateral techniques ( hold if making worse), pilates bent knee raise , ab prep,  bilateral arm raise with ab set.  quadraped leg reach     Consulted and Agree with Plan of Care  Patient       Patient will benefit from skilled therapeutic intervention in order to improve the following deficits and impairments:  Postural dysfunction, Pain, Decreased activity tolerance  Visit Diagnosis: Chronic bilateral low back pain, unspecified whether sciatica present  Abnormal posture  Spinal stenosis of lumbar region, unspecified whether neurogenic claudication present     Problem List Patient Active Problem List   Diagnosis Date Noted  . Shock ([place])   . Blood poisoning   . Acute hyponatremia 03/12/2015  . Anemia of chronic disease, malignancy  03/12/2015  . Severe sepsis with septic shock ([place]) 03/08/2015  . Hypoalbuminemia 03/08/2015  . Anasarca 03/08/2015  . Hypokalemia  03/08/2015  . 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 [place], [place], [address] Phone: [phone]   Fax:  [phone]  Name: Anna Calderon MRN: [id number] Date of Birth: 1960/10/10  PHYSICAL THERAPY DISCHARGE SUMMARY  Visits from Start of Care: 5  Current functional level related to goals / functional outcomes: See above   Remaining deficits: Intermittant back and leg pain   Education / Equipment: HEP  Plan: Patient agrees to discharge.  Patient goals were partially met. Patient is being discharged due to being pleased with the current functional level.  ?????

## 2018-09-09 ENCOUNTER — Encounter: Payer: Self-pay | Admitting: Gastroenterology

## 2018-10-05 ENCOUNTER — Telehealth: Payer: Self-pay | Admitting: Oncology

## 2018-10-05 NOTE — Telephone Encounter (Signed)
Changed appt on 4/29 to phone call - unable to reach pt, left message for patient that she does not have to come in and she will be called at the appt time

## 2018-10-21 ENCOUNTER — Telehealth: Payer: Self-pay | Admitting: Oncology

## 2018-10-21 ENCOUNTER — Inpatient Hospital Stay: Payer: Medicare Other | Attending: Oncology | Admitting: Oncology

## 2018-10-21 DIAGNOSIS — E8581 Light chain (AL) amyloidosis: Secondary | ICD-10-CM | POA: Diagnosis not present

## 2018-10-21 NOTE — Progress Notes (Signed)
Hematology and Oncology Follow Up for Telemedicine Visits  BRICIA TAHER [id number] 1961-04-23 58 y.o. 10/21/2018 8:35 AM Radene Knee, Jenny Reichmann, MDMcComb, Jenny Reichmann, MD   I connected with Ms. Melberg on 10/21/18 at  9:30 AM EDT by telephone visit and verified that I am speaking with the correct person using two identifiers.   I discussed the limitations, risks, security and privacy concerns of performing an evaluation and management service by telemedicine and the availability of in-person appointments. I also discussed with the patient that there may be a patient responsible charge related to this service. The patient expressed understanding and agreed to proceed.  Other persons participating in the visit and their role in the encounter:    Patient's location: Home Provider's location: Office  Chief Complaint:   Principle Diagnosis: 58 year old woman with AL Amyloidosis diagnosed in 2016.  She had involvement in the bone marrow, kidney as well as cardiac involvement.   Prior Therapy: She is status post kidney biopsy done on 08/10/2014 and showed positive staining for amyloidosis.   She is status post bone marrow biopsy on 09/20/2014 which showed 19% plasma cell infiltration suggesting plasma cell neoplasm.  Velcade at 1.5 mg/m subcutaneously on a weekly basis, Cyclophosphamide 300 mg/m (total dose of close to 450 mg) on a weekly basis, Dexamethasone at 20 mg by mouth weekly.  Therapy given between 11/24/2014 till 07/06/2015. Therapy discontinued given her complete response.    Current therapy: Active surveillance.    Interim History: Ms. Kehoe ports no major changes in her health.  She continues to enjoy excellent quality of life and performance status.  She denies any lower extremity swelling, dyspnea exertion or worsening neuropathy.  Her back pain from spinal stenosis has improved at this time.  She does exercise periodically including walking and stretching.     Medications: I  have reviewed the patient's current medications.  Current Outpatient Medications  Medication Sig Dispense Refill  . levothyroxine (SYNTHROID, LEVOTHROID) 50 MCG tablet Take 1 tablet by mouth daily.     No current facility-administered medications for this visit.      Allergies: No Known Allergies  Past Medical History, Surgical history, Social history, and Family History were reviewed and updated.      Lab Results: Lab Results  Component Value Date   WBC 4.9 09/19/2017   HGB 13.5 09/19/2017   HCT 41.0 09/19/2017   MCV 89.3 09/19/2017   PLT 162 09/19/2017     Chemistry      Component Value Date/Time   NA 140 09/19/2017 0856   NA 141 03/28/2017 0851   K 3.8 09/19/2017 0856   K 3.8 03/28/2017 0851   CL 106 09/19/2017 0856   CO2 27 09/19/2017 0856   CO2 27 03/28/2017 0851   BUN 11 09/19/2017 0856   BUN 12.6 03/28/2017 0851   CREATININE 0.72 09/19/2017 0856   CREATININE 0.7 03/28/2017 0851      Component Value Date/Time   CALCIUM 9.3 09/19/2017 0856   CALCIUM 9.1 03/28/2017 0851   ALKPHOS 54 09/19/2017 0856   ALKPHOS 51 03/28/2017 0851   AST 15 09/19/2017 0856   AST 15 03/28/2017 0851   ALT 13 09/19/2017 0856   ALT 8 03/28/2017 0851   BILITOT 0.4 09/19/2017 0856   BILITOT 0.82 03/28/2017 0851       Impression and Plan:  58 year old woman with:   1.   AL Amyloidosis with kidney, bone marrow and cardiac disease diagnosed in 2016. Marland Kitchen  She is status post  therapy outlined above and remains in remission since 2017.  The natural course of her disease was discussed and I have recommended continued active surveillance.  I plan to repeat her laboratory testing in 3 months.   2.    Spinal stenosis: Her back pain under better control at this time.  3.  Cardiomyopathy: Continues to follow with cardiology regarding this issue.  4. Follow-up: Will be in 3 months.   I discussed the assessment and treatment plan with the patient. The patient was provided an  opportunity to ask questions and all were answered. The patient agreed with the plan and demonstrated an understanding of the instructions.   The patient was advised to call back or seek an in-person evaluation if the symptoms worsen or if the condition fails to improve as anticipated.  I provided 20 minutes of non face-to-face telephone visit time during this encounter, and > 50% was dedicated to discussing her disease status, treatment options and answering questions regarding future plan of care.  Zola Button, MD 10/21/2018 8:35 AM

## 2018-10-21 NOTE — Telephone Encounter (Signed)
Scheduled appt per 4/29 sch message. ° °A calendar will be mailed out. °

## 2018-11-24 ENCOUNTER — Telehealth: Payer: Self-pay

## 2018-11-24 ENCOUNTER — Encounter: Payer: Self-pay | Admitting: Gastroenterology

## 2018-11-24 NOTE — Telephone Encounter (Signed)
LM for pt to call back to schedule recall colon with Dr. Tarri Glenn.  Last one in 2010 with Dr. Deatra Ina.

## 2018-12-07 ENCOUNTER — Encounter: Payer: Self-pay | Admitting: Gastroenterology

## 2018-12-18 ENCOUNTER — Other Ambulatory Visit: Payer: Self-pay

## 2018-12-18 ENCOUNTER — Ambulatory Visit: Payer: Medicare Other | Admitting: *Deleted

## 2018-12-18 VITALS — Ht 60.0 in | Wt 125.0 lb

## 2018-12-18 DIAGNOSIS — Z1211 Encounter for screening for malignant neoplasm of colon: Secondary | ICD-10-CM

## 2018-12-18 NOTE — Progress Notes (Signed)
Patient's pre-visit was done today over the phone with the patient's daughter-"Ly" due to COVID-19 pandemic. Ly states she does all her mom's dr visit her mom does speak some english but it's hard for her to understand medical info. Name,DOB and address verified. Insurance verified. Packet of Prep instructions mailed to patient including copy of a consent form and pre-procedure patient acknowledgement form-pt is aware. Patient understands to call us back with any questions or concerns. Patient denies any allergies to eggs or soy. Patient denies any problems with anesthesia/sedation. Patient denies any oxygen use at home. Patient denies taking any diet/weight loss medications or blood thinners. EMMI education assisgned to patient on colonoscopy, this was explained and instructions given to patient. Pt's daughter is aware that care partner will wait in the car during parking lot; if they feel like they will be too hot to wait in the car; they may wait in the lobby.  We want them to wear a mask (we do not have any that we can provide them), practice social distancing, and we will check their temperatures when they get here.  I did remind patient that their care partner needs to stay in the parking lot the entire time. Pt will wear mask into building

## 2018-12-31 ENCOUNTER — Telehealth: Payer: Self-pay | Admitting: Gastroenterology

## 2018-12-31 NOTE — Telephone Encounter (Signed)

## 2018-12-31 NOTE — Telephone Encounter (Signed)
No to all answer °

## 2019-01-01 ENCOUNTER — Ambulatory Visit (AMBULATORY_SURGERY_CENTER): Payer: Medicare Other | Admitting: Gastroenterology

## 2019-01-01 ENCOUNTER — Encounter: Payer: Self-pay | Admitting: Gastroenterology

## 2019-01-01 ENCOUNTER — Other Ambulatory Visit: Payer: Self-pay

## 2019-01-01 VITALS — BP 120/67 | HR 53 | Temp 98.5°F | Resp 17 | Ht 60.0 in | Wt 125.0 lb

## 2019-01-01 DIAGNOSIS — D123 Benign neoplasm of transverse colon: Secondary | ICD-10-CM

## 2019-01-01 DIAGNOSIS — Z1211 Encounter for screening for malignant neoplasm of colon: Secondary | ICD-10-CM

## 2019-01-01 DIAGNOSIS — D122 Benign neoplasm of ascending colon: Secondary | ICD-10-CM | POA: Diagnosis not present

## 2019-01-01 DIAGNOSIS — D120 Benign neoplasm of cecum: Secondary | ICD-10-CM | POA: Diagnosis not present

## 2019-01-01 MED ORDER — SODIUM CHLORIDE 0.9 % IV SOLN: 500.0000 mL | Freq: Once | INTRAVENOUS | Status: DC

## 2019-01-01 NOTE — Patient Instructions (Signed)
3 colon polyps removed today.  When pathology comes back Dr Tarri Glenn' will mail you a letter explaining when you need to have your next colonoscopy. You also have mild diverticulosis in parts of your colon.  Please read the handouts given to you today about polyps and diverticulsis.    YOU HAD AN ENDOSCOPIC PROCEDURE TODAY AT [facility]:   Refer to the procedure report that was given to you for any specific questions about what was found during the examination.  If the procedure report does not answer your questions, please call your gastroenterologist to clarify.  If you requested that your care partner not be given the details of your procedure findings, then the procedure report has been included in a sealed envelope for you to review at your convenience later.  YOU SHOULD EXPECT: Some feelings of bloating in the abdomen. Passage of more gas than usual.  Walking can help get rid of the air that was put into your GI tract during the procedure and reduce the bloating. If you had a lower endoscopy (such as a colonoscopy or flexible sigmoidoscopy) you may notice spotting of blood in your stool or on the toilet paper. If you underwent a bowel prep for your procedure, you may not have a normal bowel movement for a few days.  Please Note:  You might notice some irritation and congestion in your nose or some drainage.  This is from the oxygen used during your procedure.  There is no need for concern and it should clear up in a day or so.  SYMPTOMS TO REPORT IMMEDIATELY:   Following lower endoscopy (colonoscopy or flexible sigmoidoscopy):  Excessive amounts of blood in the stool  Significant tenderness or worsening of abdominal pains  Swelling of the abdomen that is new, acute  Fever of 100F or higher   Following upper endoscopy (EGD)  Vomiting of blood or coffee ground material  New chest pain or pain under the shoulder blades  Painful or persistently difficult swallowing  New  shortness of breath  Fever of 100F or higher  Black, tarry-looking stools  For urgent or emergent issues, a gastroenterologist can be reached at any hour by calling [phone].   DIET:  We do recommend a small meal at first, but then you may proceed to your regular diet.  Drink plenty of fluids but you should avoid alcoholic beverages for 24 hours.  ACTIVITY:  You should plan to take it easy for the rest of today and you should NOT DRIVE or use heavy machinery until tomorrow (because of the sedation medicines used during the test).    FOLLOW UP: Our staff will call the number listed on your records 48-72 hours following your procedure to check on you and address any questions or concerns that you may have regarding the information given to you following your procedure. If we do not reach you, we will leave a message.  We will attempt to reach you two times.  During this call, we will ask if you have developed any symptoms of COVID 19. If you develop any symptoms (ie: fever, flu-like symptoms, shortness of breath, cough etc.) before then, please call [phone].  If you test positive for Covid 19 in the 2 weeks post procedure, please call and report this information to [place].    If any biopsies were taken you will be contacted by phone or by letter within the next 1-3 weeks.  Please call us at [phone] if you  have not heard about the biopsies in 3 weeks.    SIGNATURES/CONFIDENTIALITY: You and/or your care partner have signed paperwork which will be entered into your electronic medical record.  These signatures attest to the fact that that the information above on your After Visit Summary has been reviewed and is understood.  Full responsibility of the confidentiality of this discharge information lies with you and/or your care-partner.

## 2019-01-01 NOTE — Op Note (Signed)
[place] Patient Name: Anna Calderon Procedure Date: 01/01/2019 10:16 AM MRN: [id number] Endoscopist: Thornton Park MD, MD Age: 58 Referring MD:  Date of Birth: 10-19-1960 Gender: Female Account #: [id number] Procedure:                Colonoscopy Indications:              Surveillance: Personal history of adenomatous                            polyps on last colonoscopy 5 years ago                           No known family history of colon cancer or polyps. Medicines:                See the Anesthesia note for documentation of the                            administered medications Procedure:                Pre-Anesthesia Assessment:                           - Prior to the procedure, a History and Physical                            was performed, and patient medications and                            allergies were reviewed. The patient's tolerance of                            previous anesthesia was also reviewed. The risks                            and benefits of the procedure and the sedation                            options and risks were discussed with the patient.                            All questions were answered, and informed consent                            was obtained. Prior Anticoagulants: The patient has                            taken no previous anticoagulant or antiplatelet                            agents. ASA Grade Assessment: II - A patient with                            mild systemic disease. After reviewing the risks  and benefits, the patient was deemed in                            satisfactory condition to undergo the procedure.                           After obtaining informed consent, the colonoscope                            was passed under direct vision. Throughout the                            procedure, the patient's blood pressure, pulse, and                            oxygen saturations were  monitored continuously. The                            Model CF-HQ190L [id number]) scope was introduced                            through the anus and advanced to the the cecum,                            identified by the appendiceal orifice, ileocecal                            valve and palpation. A second forward view of the                            right colon was performed. The colonoscopy was                            performed without difficulty. The patient tolerated                            the procedure well. The quality of the bowel                            preparation was good. The ileocecal valve,                            appendiceal orifice, and rectum were photographed. Scope In: 10:30:49 AM Scope Out: 10:48:20 AM Scope Withdrawal Time: 0 hours 15 minutes 1 second  Total Procedure Duration: 0 hours 17 minutes 31 seconds  Findings:                 The perianal and digital rectal examinations were                            normal.                           A 3 mm polyp was found in the transverse colon. The  polyp was sessile. The polyp was removed with a                            cold snare. Resection and retrieval were complete.                            Estimated blood loss: none.                           A 1 mm polyp was found in the ileocecal valve. The                            polyp was flat. The polyp was removed with a cold                            biopsy forceps. Resection and retrieval were                            complete. Estimated blood loss: none.                           A 2 mm polyp was found in the ascending colon and a                            2 mm polyp was found in the descending colon. The                            polyps were sessile. The polyps were removed with a                            cold snare. Resection and retrieval were complete.                           There is scattered diverticulosis  in the right                            colon and sigmoid. The exam was otherwise without                            abnormality on direct and retroflexion views. Complications:            No immediate complications. Estimated blood loss:                            Minimal. Estimated Blood Loss:     Estimated blood loss was minimal. Impression:               - One 3 mm polyp in the transverse colon, removed                            with a cold snare. Resected and retrieved.                           -  One 1 mm polyp at the ileocecal valve, removed                            with a cold biopsy forceps. Resected and retrieved.                           - One 2 mm polyp in the ascending colon, removed                            with a cold snare. Resected and retrieved.                           - One 2 mm polyp in the descending colon, removed                            with a cold snare. Resected and retrieved.                           - Mild diverticulosis in the ascending colon and                            sigmoid colon.                           - The examination was otherwise normal on direct                            and retroflexion views. Recommendation:           - Patient has a contact number available for                            emergencies. The signs and symptoms of potential                            delayed complications were discussed with the                            patient. Return to normal activities tomorrow.                            Written discharge instructions were provided to the                            patient.                           - Resume regular diet. High fiber diet recommended.                           - Continue present medications.                           - Await pathology results.                           -  Repeat colonoscopy date to be determined after                            pending pathology results are reviewed for                             surveillance based on pathology results. If at                            least 3 polyps are adenomas, will repeat                            colonoscopy in 3 years. If one or two polyps are                            adenomas, will repeat colonoscopy in 7 years. Thornton Park MD, MD 01/01/2019 10:56:51 AM This report has been signed electronically.

## 2019-01-01 NOTE — Progress Notes (Signed)
Pt's states no medical or surgical changes since previsit or office visit.  Temp per [place], VS per CW

## 2019-01-01 NOTE — Progress Notes (Signed)
PT taken to PACU. Monitors in place. VSS. Report given to RN. 

## 2019-01-01 NOTE — Progress Notes (Signed)
Called to room to assist during endoscopic procedure.  Patient ID and intended procedure confirmed with present staff. Received instructions for my participation in the procedure from the performing physician.  

## 2019-01-05 ENCOUNTER — Telehealth: Payer: Self-pay | Admitting: *Deleted

## 2019-01-05 NOTE — Telephone Encounter (Signed)
  Follow up Call-  Call back number 01/01/2019  Post procedure Call Back phone  # [phone]  Permission to leave phone message Yes  Some recent data might be hidden    No answer, no machine

## 2019-01-05 NOTE — Telephone Encounter (Signed)
First follow up  call attempt.  No answering or voicemail option.

## 2019-01-10 ENCOUNTER — Encounter: Payer: Self-pay | Admitting: Gastroenterology

## 2019-01-20 ENCOUNTER — Inpatient Hospital Stay: Payer: Medicare Other

## 2019-01-26 ENCOUNTER — Inpatient Hospital Stay: Payer: Medicare Other | Attending: Oncology | Admitting: Oncology
